# Patient Record
Sex: Male | Born: 1935
Health system: Southern US, Community
[De-identification: ages and names within clinical notes are randomized; demographics above are authoritative.]

## PROBLEM LIST (undated history)

## (undated) DIAGNOSIS — I4891 Unspecified atrial fibrillation: Secondary | ICD-10-CM

## (undated) DIAGNOSIS — N189 Chronic kidney disease, unspecified: Secondary | ICD-10-CM

## (undated) DIAGNOSIS — G629 Polyneuropathy, unspecified: Secondary | ICD-10-CM

## (undated) DIAGNOSIS — F0390 Unspecified dementia without behavioral disturbance: Secondary | ICD-10-CM

## (undated) DIAGNOSIS — G459 Transient cerebral ischemic attack, unspecified: Secondary | ICD-10-CM

## (undated) DIAGNOSIS — I10 Essential (primary) hypertension: Secondary | ICD-10-CM

## (undated) DIAGNOSIS — E079 Disorder of thyroid, unspecified: Secondary | ICD-10-CM

## (undated) HISTORY — DX: Polyneuropathy, unspecified: G62.9

## (undated) HISTORY — DX: Transient cerebral ischemic attack, unspecified: G45.9

## (undated) HISTORY — DX: Unspecified dementia, unspecified severity, without behavioral disturbance, psychotic disturbance, mood disturbance, and anxiety: F03.90

## (undated) HISTORY — PX: HIP SURGERY: SHX245

## (undated) HISTORY — DX: Essential (primary) hypertension: I10

## (undated) HISTORY — PX: KNEE SURGERY: SHX244

## (undated) HISTORY — PX: BACK SURGERY: SHX140

## (undated) HISTORY — PX: SHOULDER SURGERY: SHX246

## (undated) HISTORY — DX: Chronic kidney disease, unspecified: N18.9

## (undated) HISTORY — PX: CHOLECYSTECTOMY: SHX55

## (undated) HISTORY — DX: Disorder of thyroid, unspecified: E07.9

## (undated) HISTORY — DX: Unspecified atrial fibrillation: I48.91

---

## 2011-06-22 DIAGNOSIS — Z471 Aftercare following joint replacement surgery: Secondary | ICD-10-CM | POA: Diagnosis not present

## 2011-07-27 DIAGNOSIS — C44611 Basal cell carcinoma of skin of unspecified upper limb, including shoulder: Secondary | ICD-10-CM | POA: Diagnosis not present

## 2011-07-27 DIAGNOSIS — L570 Actinic keratosis: Secondary | ICD-10-CM | POA: Diagnosis not present

## 2011-07-29 DIAGNOSIS — M1990 Unspecified osteoarthritis, unspecified site: Secondary | ICD-10-CM | POA: Diagnosis not present

## 2011-07-29 DIAGNOSIS — R5381 Other malaise: Secondary | ICD-10-CM | POA: Diagnosis not present

## 2011-07-29 DIAGNOSIS — R5383 Other fatigue: Secondary | ICD-10-CM | POA: Diagnosis not present

## 2011-07-29 DIAGNOSIS — Z0000 Encounter for general adult medical examination without abnormal findings: Secondary | ICD-10-CM | POA: Diagnosis not present

## 2011-07-29 DIAGNOSIS — Z23 Encounter for immunization: Secondary | ICD-10-CM | POA: Diagnosis not present

## 2011-07-29 DIAGNOSIS — E559 Vitamin D deficiency, unspecified: Secondary | ICD-10-CM | POA: Diagnosis not present

## 2011-09-07 DIAGNOSIS — E039 Hypothyroidism, unspecified: Secondary | ICD-10-CM | POA: Diagnosis not present

## 2011-11-05 DIAGNOSIS — M069 Rheumatoid arthritis, unspecified: Secondary | ICD-10-CM | POA: Diagnosis not present

## 2011-11-12 DIAGNOSIS — L2089 Other atopic dermatitis: Secondary | ICD-10-CM | POA: Diagnosis not present

## 2011-11-26 DIAGNOSIS — C4441 Basal cell carcinoma of skin of scalp and neck: Secondary | ICD-10-CM | POA: Diagnosis not present

## 2011-12-20 DIAGNOSIS — Z471 Aftercare following joint replacement surgery: Secondary | ICD-10-CM | POA: Diagnosis not present

## 2011-12-31 DIAGNOSIS — L570 Actinic keratosis: Secondary | ICD-10-CM | POA: Diagnosis not present

## 2012-01-03 DIAGNOSIS — E039 Hypothyroidism, unspecified: Secondary | ICD-10-CM | POA: Diagnosis not present

## 2012-01-24 DIAGNOSIS — L570 Actinic keratosis: Secondary | ICD-10-CM | POA: Diagnosis not present

## 2012-02-24 DIAGNOSIS — M5137 Other intervertebral disc degeneration, lumbosacral region: Secondary | ICD-10-CM | POA: Diagnosis not present

## 2012-02-24 DIAGNOSIS — M545 Low back pain: Secondary | ICD-10-CM | POA: Diagnosis not present

## 2012-02-24 DIAGNOSIS — IMO0002 Reserved for concepts with insufficient information to code with codable children: Secondary | ICD-10-CM | POA: Diagnosis not present

## 2012-02-24 DIAGNOSIS — M999 Biomechanical lesion, unspecified: Secondary | ICD-10-CM | POA: Diagnosis not present

## 2012-02-28 DIAGNOSIS — M999 Biomechanical lesion, unspecified: Secondary | ICD-10-CM | POA: Diagnosis not present

## 2012-02-28 DIAGNOSIS — IMO0002 Reserved for concepts with insufficient information to code with codable children: Secondary | ICD-10-CM | POA: Diagnosis not present

## 2012-02-28 DIAGNOSIS — M5137 Other intervertebral disc degeneration, lumbosacral region: Secondary | ICD-10-CM | POA: Diagnosis not present

## 2012-02-28 DIAGNOSIS — M545 Low back pain: Secondary | ICD-10-CM | POA: Diagnosis not present

## 2012-03-01 DIAGNOSIS — IMO0002 Reserved for concepts with insufficient information to code with codable children: Secondary | ICD-10-CM | POA: Diagnosis not present

## 2012-03-01 DIAGNOSIS — M999 Biomechanical lesion, unspecified: Secondary | ICD-10-CM | POA: Diagnosis not present

## 2012-03-01 DIAGNOSIS — M5137 Other intervertebral disc degeneration, lumbosacral region: Secondary | ICD-10-CM | POA: Diagnosis not present

## 2012-03-01 DIAGNOSIS — M545 Low back pain: Secondary | ICD-10-CM | POA: Diagnosis not present

## 2012-03-03 DIAGNOSIS — M545 Low back pain: Secondary | ICD-10-CM | POA: Diagnosis not present

## 2012-03-03 DIAGNOSIS — M5137 Other intervertebral disc degeneration, lumbosacral region: Secondary | ICD-10-CM | POA: Diagnosis not present

## 2012-03-03 DIAGNOSIS — M999 Biomechanical lesion, unspecified: Secondary | ICD-10-CM | POA: Diagnosis not present

## 2012-03-03 DIAGNOSIS — IMO0002 Reserved for concepts with insufficient information to code with codable children: Secondary | ICD-10-CM | POA: Diagnosis not present

## 2012-03-06 DIAGNOSIS — IMO0002 Reserved for concepts with insufficient information to code with codable children: Secondary | ICD-10-CM | POA: Diagnosis not present

## 2012-03-06 DIAGNOSIS — M5137 Other intervertebral disc degeneration, lumbosacral region: Secondary | ICD-10-CM | POA: Diagnosis not present

## 2012-03-06 DIAGNOSIS — M545 Low back pain: Secondary | ICD-10-CM | POA: Diagnosis not present

## 2012-03-06 DIAGNOSIS — M999 Biomechanical lesion, unspecified: Secondary | ICD-10-CM | POA: Diagnosis not present

## 2012-03-08 DIAGNOSIS — M5137 Other intervertebral disc degeneration, lumbosacral region: Secondary | ICD-10-CM | POA: Diagnosis not present

## 2012-03-08 DIAGNOSIS — M545 Low back pain: Secondary | ICD-10-CM | POA: Diagnosis not present

## 2012-03-08 DIAGNOSIS — IMO0002 Reserved for concepts with insufficient information to code with codable children: Secondary | ICD-10-CM | POA: Diagnosis not present

## 2012-03-08 DIAGNOSIS — M999 Biomechanical lesion, unspecified: Secondary | ICD-10-CM | POA: Diagnosis not present

## 2012-03-10 DIAGNOSIS — IMO0002 Reserved for concepts with insufficient information to code with codable children: Secondary | ICD-10-CM | POA: Diagnosis not present

## 2012-03-10 DIAGNOSIS — M5137 Other intervertebral disc degeneration, lumbosacral region: Secondary | ICD-10-CM | POA: Diagnosis not present

## 2012-03-10 DIAGNOSIS — M545 Low back pain: Secondary | ICD-10-CM | POA: Diagnosis not present

## 2012-03-10 DIAGNOSIS — M999 Biomechanical lesion, unspecified: Secondary | ICD-10-CM | POA: Diagnosis not present

## 2012-03-13 DIAGNOSIS — IMO0002 Reserved for concepts with insufficient information to code with codable children: Secondary | ICD-10-CM | POA: Diagnosis not present

## 2012-03-13 DIAGNOSIS — M5137 Other intervertebral disc degeneration, lumbosacral region: Secondary | ICD-10-CM | POA: Diagnosis not present

## 2012-03-13 DIAGNOSIS — M545 Low back pain: Secondary | ICD-10-CM | POA: Diagnosis not present

## 2012-03-13 DIAGNOSIS — M999 Biomechanical lesion, unspecified: Secondary | ICD-10-CM | POA: Diagnosis not present

## 2012-03-15 DIAGNOSIS — M999 Biomechanical lesion, unspecified: Secondary | ICD-10-CM | POA: Diagnosis not present

## 2012-03-15 DIAGNOSIS — M5137 Other intervertebral disc degeneration, lumbosacral region: Secondary | ICD-10-CM | POA: Diagnosis not present

## 2012-03-15 DIAGNOSIS — IMO0002 Reserved for concepts with insufficient information to code with codable children: Secondary | ICD-10-CM | POA: Diagnosis not present

## 2012-03-15 DIAGNOSIS — M545 Low back pain: Secondary | ICD-10-CM | POA: Diagnosis not present

## 2012-03-17 DIAGNOSIS — M5137 Other intervertebral disc degeneration, lumbosacral region: Secondary | ICD-10-CM | POA: Diagnosis not present

## 2012-03-17 DIAGNOSIS — M545 Low back pain: Secondary | ICD-10-CM | POA: Diagnosis not present

## 2012-03-17 DIAGNOSIS — M999 Biomechanical lesion, unspecified: Secondary | ICD-10-CM | POA: Diagnosis not present

## 2012-03-17 DIAGNOSIS — IMO0002 Reserved for concepts with insufficient information to code with codable children: Secondary | ICD-10-CM | POA: Diagnosis not present

## 2012-03-20 DIAGNOSIS — M999 Biomechanical lesion, unspecified: Secondary | ICD-10-CM | POA: Diagnosis not present

## 2012-03-20 DIAGNOSIS — M5137 Other intervertebral disc degeneration, lumbosacral region: Secondary | ICD-10-CM | POA: Diagnosis not present

## 2012-03-20 DIAGNOSIS — IMO0002 Reserved for concepts with insufficient information to code with codable children: Secondary | ICD-10-CM | POA: Diagnosis not present

## 2012-03-20 DIAGNOSIS — M545 Low back pain: Secondary | ICD-10-CM | POA: Diagnosis not present

## 2012-03-22 DIAGNOSIS — M545 Low back pain: Secondary | ICD-10-CM | POA: Diagnosis not present

## 2012-03-22 DIAGNOSIS — M999 Biomechanical lesion, unspecified: Secondary | ICD-10-CM | POA: Diagnosis not present

## 2012-03-22 DIAGNOSIS — IMO0002 Reserved for concepts with insufficient information to code with codable children: Secondary | ICD-10-CM | POA: Diagnosis not present

## 2012-03-22 DIAGNOSIS — M5137 Other intervertebral disc degeneration, lumbosacral region: Secondary | ICD-10-CM | POA: Diagnosis not present

## 2012-03-24 DIAGNOSIS — M999 Biomechanical lesion, unspecified: Secondary | ICD-10-CM | POA: Diagnosis not present

## 2012-03-24 DIAGNOSIS — M5137 Other intervertebral disc degeneration, lumbosacral region: Secondary | ICD-10-CM | POA: Diagnosis not present

## 2012-03-24 DIAGNOSIS — IMO0002 Reserved for concepts with insufficient information to code with codable children: Secondary | ICD-10-CM | POA: Diagnosis not present

## 2012-03-24 DIAGNOSIS — M545 Low back pain: Secondary | ICD-10-CM | POA: Diagnosis not present

## 2012-03-27 DIAGNOSIS — IMO0002 Reserved for concepts with insufficient information to code with codable children: Secondary | ICD-10-CM | POA: Diagnosis not present

## 2012-03-27 DIAGNOSIS — M999 Biomechanical lesion, unspecified: Secondary | ICD-10-CM | POA: Diagnosis not present

## 2012-03-27 DIAGNOSIS — M545 Low back pain: Secondary | ICD-10-CM | POA: Diagnosis not present

## 2012-03-27 DIAGNOSIS — M5137 Other intervertebral disc degeneration, lumbosacral region: Secondary | ICD-10-CM | POA: Diagnosis not present

## 2012-03-29 DIAGNOSIS — M545 Low back pain: Secondary | ICD-10-CM | POA: Diagnosis not present

## 2012-03-29 DIAGNOSIS — IMO0002 Reserved for concepts with insufficient information to code with codable children: Secondary | ICD-10-CM | POA: Diagnosis not present

## 2012-03-29 DIAGNOSIS — M999 Biomechanical lesion, unspecified: Secondary | ICD-10-CM | POA: Diagnosis not present

## 2012-03-29 DIAGNOSIS — M5137 Other intervertebral disc degeneration, lumbosacral region: Secondary | ICD-10-CM | POA: Diagnosis not present

## 2012-03-31 DIAGNOSIS — IMO0002 Reserved for concepts with insufficient information to code with codable children: Secondary | ICD-10-CM | POA: Diagnosis not present

## 2012-03-31 DIAGNOSIS — M545 Low back pain: Secondary | ICD-10-CM | POA: Diagnosis not present

## 2012-03-31 DIAGNOSIS — M5137 Other intervertebral disc degeneration, lumbosacral region: Secondary | ICD-10-CM | POA: Diagnosis not present

## 2012-03-31 DIAGNOSIS — M999 Biomechanical lesion, unspecified: Secondary | ICD-10-CM | POA: Diagnosis not present

## 2012-04-03 DIAGNOSIS — IMO0002 Reserved for concepts with insufficient information to code with codable children: Secondary | ICD-10-CM | POA: Diagnosis not present

## 2012-04-03 DIAGNOSIS — M999 Biomechanical lesion, unspecified: Secondary | ICD-10-CM | POA: Diagnosis not present

## 2012-04-03 DIAGNOSIS — M545 Low back pain: Secondary | ICD-10-CM | POA: Diagnosis not present

## 2012-04-03 DIAGNOSIS — M5137 Other intervertebral disc degeneration, lumbosacral region: Secondary | ICD-10-CM | POA: Diagnosis not present

## 2012-04-05 DIAGNOSIS — M545 Low back pain: Secondary | ICD-10-CM | POA: Diagnosis not present

## 2012-04-05 DIAGNOSIS — M5137 Other intervertebral disc degeneration, lumbosacral region: Secondary | ICD-10-CM | POA: Diagnosis not present

## 2012-04-05 DIAGNOSIS — IMO0002 Reserved for concepts with insufficient information to code with codable children: Secondary | ICD-10-CM | POA: Diagnosis not present

## 2012-04-05 DIAGNOSIS — M999 Biomechanical lesion, unspecified: Secondary | ICD-10-CM | POA: Diagnosis not present

## 2012-04-07 DIAGNOSIS — M999 Biomechanical lesion, unspecified: Secondary | ICD-10-CM | POA: Diagnosis not present

## 2012-04-07 DIAGNOSIS — M5137 Other intervertebral disc degeneration, lumbosacral region: Secondary | ICD-10-CM | POA: Diagnosis not present

## 2012-04-07 DIAGNOSIS — M545 Low back pain: Secondary | ICD-10-CM | POA: Diagnosis not present

## 2012-04-07 DIAGNOSIS — IMO0002 Reserved for concepts with insufficient information to code with codable children: Secondary | ICD-10-CM | POA: Diagnosis not present

## 2012-04-10 DIAGNOSIS — M545 Low back pain: Secondary | ICD-10-CM | POA: Diagnosis not present

## 2012-04-10 DIAGNOSIS — M5137 Other intervertebral disc degeneration, lumbosacral region: Secondary | ICD-10-CM | POA: Diagnosis not present

## 2012-04-10 DIAGNOSIS — M999 Biomechanical lesion, unspecified: Secondary | ICD-10-CM | POA: Diagnosis not present

## 2012-04-10 DIAGNOSIS — IMO0002 Reserved for concepts with insufficient information to code with codable children: Secondary | ICD-10-CM | POA: Diagnosis not present

## 2012-04-12 DIAGNOSIS — M999 Biomechanical lesion, unspecified: Secondary | ICD-10-CM | POA: Diagnosis not present

## 2012-04-12 DIAGNOSIS — M545 Low back pain: Secondary | ICD-10-CM | POA: Diagnosis not present

## 2012-04-12 DIAGNOSIS — IMO0002 Reserved for concepts with insufficient information to code with codable children: Secondary | ICD-10-CM | POA: Diagnosis not present

## 2012-04-12 DIAGNOSIS — M5137 Other intervertebral disc degeneration, lumbosacral region: Secondary | ICD-10-CM | POA: Diagnosis not present

## 2012-04-14 DIAGNOSIS — M545 Low back pain: Secondary | ICD-10-CM | POA: Diagnosis not present

## 2012-04-14 DIAGNOSIS — M5137 Other intervertebral disc degeneration, lumbosacral region: Secondary | ICD-10-CM | POA: Diagnosis not present

## 2012-04-14 DIAGNOSIS — M999 Biomechanical lesion, unspecified: Secondary | ICD-10-CM | POA: Diagnosis not present

## 2012-04-14 DIAGNOSIS — IMO0002 Reserved for concepts with insufficient information to code with codable children: Secondary | ICD-10-CM | POA: Diagnosis not present

## 2012-04-18 DIAGNOSIS — Z23 Encounter for immunization: Secondary | ICD-10-CM | POA: Diagnosis not present

## 2012-04-20 DIAGNOSIS — IMO0002 Reserved for concepts with insufficient information to code with codable children: Secondary | ICD-10-CM | POA: Diagnosis not present

## 2012-04-20 DIAGNOSIS — M5137 Other intervertebral disc degeneration, lumbosacral region: Secondary | ICD-10-CM | POA: Diagnosis not present

## 2012-04-20 DIAGNOSIS — M999 Biomechanical lesion, unspecified: Secondary | ICD-10-CM | POA: Diagnosis not present

## 2012-04-20 DIAGNOSIS — M545 Low back pain: Secondary | ICD-10-CM | POA: Diagnosis not present

## 2012-04-27 DIAGNOSIS — M5137 Other intervertebral disc degeneration, lumbosacral region: Secondary | ICD-10-CM | POA: Diagnosis not present

## 2012-04-27 DIAGNOSIS — M545 Low back pain: Secondary | ICD-10-CM | POA: Diagnosis not present

## 2012-04-27 DIAGNOSIS — M999 Biomechanical lesion, unspecified: Secondary | ICD-10-CM | POA: Diagnosis not present

## 2012-04-27 DIAGNOSIS — IMO0002 Reserved for concepts with insufficient information to code with codable children: Secondary | ICD-10-CM | POA: Diagnosis not present

## 2012-05-01 DIAGNOSIS — M5137 Other intervertebral disc degeneration, lumbosacral region: Secondary | ICD-10-CM | POA: Diagnosis not present

## 2012-05-01 DIAGNOSIS — M999 Biomechanical lesion, unspecified: Secondary | ICD-10-CM | POA: Diagnosis not present

## 2012-05-01 DIAGNOSIS — M545 Low back pain: Secondary | ICD-10-CM | POA: Diagnosis not present

## 2012-05-01 DIAGNOSIS — IMO0002 Reserved for concepts with insufficient information to code with codable children: Secondary | ICD-10-CM | POA: Diagnosis not present

## 2012-05-11 DIAGNOSIS — M999 Biomechanical lesion, unspecified: Secondary | ICD-10-CM | POA: Diagnosis not present

## 2012-05-11 DIAGNOSIS — IMO0002 Reserved for concepts with insufficient information to code with codable children: Secondary | ICD-10-CM | POA: Diagnosis not present

## 2012-05-11 DIAGNOSIS — M5137 Other intervertebral disc degeneration, lumbosacral region: Secondary | ICD-10-CM | POA: Diagnosis not present

## 2012-05-11 DIAGNOSIS — M545 Low back pain: Secondary | ICD-10-CM | POA: Diagnosis not present

## 2012-05-25 DIAGNOSIS — M4800 Spinal stenosis, site unspecified: Secondary | ICD-10-CM | POA: Diagnosis not present

## 2012-05-25 DIAGNOSIS — I10 Essential (primary) hypertension: Secondary | ICD-10-CM | POA: Diagnosis not present

## 2012-05-25 DIAGNOSIS — M549 Dorsalgia, unspecified: Secondary | ICD-10-CM | POA: Diagnosis not present

## 2012-06-26 DIAGNOSIS — G894 Chronic pain syndrome: Secondary | ICD-10-CM | POA: Diagnosis not present

## 2012-07-27 DIAGNOSIS — L570 Actinic keratosis: Secondary | ICD-10-CM | POA: Diagnosis not present

## 2012-07-28 DIAGNOSIS — E559 Vitamin D deficiency, unspecified: Secondary | ICD-10-CM | POA: Diagnosis not present

## 2012-07-28 DIAGNOSIS — M8448XA Pathological fracture, other site, initial encounter for fracture: Secondary | ICD-10-CM | POA: Diagnosis not present

## 2012-07-28 DIAGNOSIS — M4800 Spinal stenosis, site unspecified: Secondary | ICD-10-CM | POA: Diagnosis not present

## 2012-07-28 DIAGNOSIS — I10 Essential (primary) hypertension: Secondary | ICD-10-CM | POA: Diagnosis not present

## 2012-07-28 DIAGNOSIS — E039 Hypothyroidism, unspecified: Secondary | ICD-10-CM | POA: Diagnosis not present

## 2012-10-25 DIAGNOSIS — I10 Essential (primary) hypertension: Secondary | ICD-10-CM | POA: Diagnosis not present

## 2012-10-25 DIAGNOSIS — G894 Chronic pain syndrome: Secondary | ICD-10-CM | POA: Diagnosis not present

## 2013-01-25 DIAGNOSIS — C44611 Basal cell carcinoma of skin of unspecified upper limb, including shoulder: Secondary | ICD-10-CM | POA: Diagnosis not present

## 2013-01-25 DIAGNOSIS — L570 Actinic keratosis: Secondary | ICD-10-CM | POA: Diagnosis not present

## 2013-01-26 DIAGNOSIS — G894 Chronic pain syndrome: Secondary | ICD-10-CM | POA: Diagnosis not present

## 2013-01-26 DIAGNOSIS — M069 Rheumatoid arthritis, unspecified: Secondary | ICD-10-CM | POA: Diagnosis not present

## 2013-02-20 DIAGNOSIS — C44621 Squamous cell carcinoma of skin of unspecified upper limb, including shoulder: Secondary | ICD-10-CM | POA: Diagnosis not present

## 2013-03-08 DIAGNOSIS — Z23 Encounter for immunization: Secondary | ICD-10-CM | POA: Diagnosis not present

## 2013-04-26 DIAGNOSIS — G894 Chronic pain syndrome: Secondary | ICD-10-CM | POA: Diagnosis not present

## 2013-05-01 DIAGNOSIS — L570 Actinic keratosis: Secondary | ICD-10-CM | POA: Diagnosis not present

## 2013-06-06 DIAGNOSIS — M549 Dorsalgia, unspecified: Secondary | ICD-10-CM | POA: Diagnosis not present

## 2013-06-19 DIAGNOSIS — Z471 Aftercare following joint replacement surgery: Secondary | ICD-10-CM | POA: Diagnosis not present

## 2013-06-19 DIAGNOSIS — Z96649 Presence of unspecified artificial hip joint: Secondary | ICD-10-CM | POA: Diagnosis not present

## 2013-06-19 DIAGNOSIS — IMO0002 Reserved for concepts with insufficient information to code with codable children: Secondary | ICD-10-CM | POA: Diagnosis not present

## 2013-08-03 DIAGNOSIS — L570 Actinic keratosis: Secondary | ICD-10-CM | POA: Diagnosis not present

## 2013-08-08 DIAGNOSIS — I10 Essential (primary) hypertension: Secondary | ICD-10-CM | POA: Diagnosis not present

## 2013-08-08 DIAGNOSIS — Z79899 Other long term (current) drug therapy: Secondary | ICD-10-CM | POA: Diagnosis not present

## 2013-08-08 DIAGNOSIS — E559 Vitamin D deficiency, unspecified: Secondary | ICD-10-CM | POA: Diagnosis not present

## 2013-08-08 DIAGNOSIS — M79609 Pain in unspecified limb: Secondary | ICD-10-CM | POA: Diagnosis not present

## 2013-08-08 DIAGNOSIS — M069 Rheumatoid arthritis, unspecified: Secondary | ICD-10-CM | POA: Diagnosis not present

## 2013-08-08 DIAGNOSIS — E0590 Thyrotoxicosis, unspecified without thyrotoxic crisis or storm: Secondary | ICD-10-CM | POA: Diagnosis not present

## 2013-09-18 DIAGNOSIS — I10 Essential (primary) hypertension: Secondary | ICD-10-CM | POA: Diagnosis not present

## 2013-09-18 DIAGNOSIS — M109 Gout, unspecified: Secondary | ICD-10-CM | POA: Diagnosis not present

## 2013-11-14 DIAGNOSIS — G894 Chronic pain syndrome: Secondary | ICD-10-CM | POA: Diagnosis not present

## 2013-11-14 DIAGNOSIS — M069 Rheumatoid arthritis, unspecified: Secondary | ICD-10-CM | POA: Diagnosis not present

## 2014-01-29 DIAGNOSIS — L570 Actinic keratosis: Secondary | ICD-10-CM | POA: Diagnosis not present

## 2014-01-29 DIAGNOSIS — C44319 Basal cell carcinoma of skin of other parts of face: Secondary | ICD-10-CM | POA: Diagnosis not present

## 2014-02-14 DIAGNOSIS — G894 Chronic pain syndrome: Secondary | ICD-10-CM | POA: Diagnosis not present

## 2014-02-14 DIAGNOSIS — Z23 Encounter for immunization: Secondary | ICD-10-CM | POA: Diagnosis not present

## 2014-05-07 DIAGNOSIS — L578 Other skin changes due to chronic exposure to nonionizing radiation: Secondary | ICD-10-CM | POA: Diagnosis not present

## 2014-05-07 DIAGNOSIS — L570 Actinic keratosis: Secondary | ICD-10-CM | POA: Diagnosis not present

## 2014-05-07 DIAGNOSIS — L821 Other seborrheic keratosis: Secondary | ICD-10-CM | POA: Diagnosis not present

## 2014-05-16 DIAGNOSIS — G894 Chronic pain syndrome: Secondary | ICD-10-CM | POA: Diagnosis not present

## 2014-08-06 DIAGNOSIS — L570 Actinic keratosis: Secondary | ICD-10-CM | POA: Diagnosis not present

## 2014-08-06 DIAGNOSIS — L578 Other skin changes due to chronic exposure to nonionizing radiation: Secondary | ICD-10-CM | POA: Diagnosis not present

## 2014-08-06 DIAGNOSIS — L821 Other seborrheic keratosis: Secondary | ICD-10-CM | POA: Diagnosis not present

## 2014-08-14 DIAGNOSIS — M109 Gout, unspecified: Secondary | ICD-10-CM | POA: Diagnosis not present

## 2014-08-14 DIAGNOSIS — Z79899 Other long term (current) drug therapy: Secondary | ICD-10-CM | POA: Diagnosis not present

## 2014-08-14 DIAGNOSIS — N183 Chronic kidney disease, stage 3 (moderate): Secondary | ICD-10-CM | POA: Diagnosis not present

## 2014-08-14 DIAGNOSIS — M069 Rheumatoid arthritis, unspecified: Secondary | ICD-10-CM | POA: Diagnosis not present

## 2014-08-14 DIAGNOSIS — Z1389 Encounter for screening for other disorder: Secondary | ICD-10-CM | POA: Diagnosis not present

## 2014-08-14 DIAGNOSIS — I10 Essential (primary) hypertension: Secondary | ICD-10-CM | POA: Diagnosis not present

## 2014-08-14 DIAGNOSIS — E039 Hypothyroidism, unspecified: Secondary | ICD-10-CM | POA: Diagnosis not present

## 2014-11-15 DIAGNOSIS — Z79899 Other long term (current) drug therapy: Secondary | ICD-10-CM | POA: Diagnosis not present

## 2014-11-15 DIAGNOSIS — M069 Rheumatoid arthritis, unspecified: Secondary | ICD-10-CM | POA: Diagnosis not present

## 2014-11-15 DIAGNOSIS — G894 Chronic pain syndrome: Secondary | ICD-10-CM | POA: Diagnosis not present

## 2015-01-08 DIAGNOSIS — M109 Gout, unspecified: Secondary | ICD-10-CM | POA: Diagnosis not present

## 2015-01-08 DIAGNOSIS — R42 Dizziness and giddiness: Secondary | ICD-10-CM | POA: Diagnosis not present

## 2015-01-08 DIAGNOSIS — M069 Rheumatoid arthritis, unspecified: Secondary | ICD-10-CM | POA: Diagnosis not present

## 2015-01-08 DIAGNOSIS — I10 Essential (primary) hypertension: Secondary | ICD-10-CM | POA: Diagnosis not present

## 2015-01-08 DIAGNOSIS — R531 Weakness: Secondary | ICD-10-CM | POA: Diagnosis not present

## 2015-01-30 DIAGNOSIS — M47817 Spondylosis without myelopathy or radiculopathy, lumbosacral region: Secondary | ICD-10-CM | POA: Diagnosis not present

## 2015-01-30 DIAGNOSIS — M419 Scoliosis, unspecified: Secondary | ICD-10-CM | POA: Diagnosis not present

## 2015-01-30 DIAGNOSIS — M545 Low back pain: Secondary | ICD-10-CM | POA: Diagnosis not present

## 2015-01-30 DIAGNOSIS — M5125 Other intervertebral disc displacement, thoracolumbar region: Secondary | ICD-10-CM | POA: Diagnosis not present

## 2015-01-30 DIAGNOSIS — M4806 Spinal stenosis, lumbar region: Secondary | ICD-10-CM | POA: Diagnosis not present

## 2015-02-12 DIAGNOSIS — M06071 Rheumatoid arthritis without rheumatoid factor, right ankle and foot: Secondary | ICD-10-CM | POA: Diagnosis not present

## 2015-02-12 DIAGNOSIS — Z79899 Other long term (current) drug therapy: Secondary | ICD-10-CM | POA: Diagnosis not present

## 2015-02-12 DIAGNOSIS — G894 Chronic pain syndrome: Secondary | ICD-10-CM | POA: Diagnosis not present

## 2015-02-25 DIAGNOSIS — Z23 Encounter for immunization: Secondary | ICD-10-CM | POA: Diagnosis not present

## 2015-03-06 DIAGNOSIS — M47817 Spondylosis without myelopathy or radiculopathy, lumbosacral region: Secondary | ICD-10-CM | POA: Diagnosis not present

## 2015-05-07 DIAGNOSIS — L821 Other seborrheic keratosis: Secondary | ICD-10-CM | POA: Diagnosis not present

## 2015-05-07 DIAGNOSIS — L570 Actinic keratosis: Secondary | ICD-10-CM | POA: Diagnosis not present

## 2015-05-07 DIAGNOSIS — L578 Other skin changes due to chronic exposure to nonionizing radiation: Secondary | ICD-10-CM | POA: Diagnosis not present

## 2015-05-07 DIAGNOSIS — C44219 Basal cell carcinoma of skin of left ear and external auricular canal: Secondary | ICD-10-CM | POA: Diagnosis not present

## 2015-05-13 DIAGNOSIS — M05772 Rheumatoid arthritis with rheumatoid factor of left ankle and foot without organ or systems involvement: Secondary | ICD-10-CM | POA: Diagnosis not present

## 2015-05-13 DIAGNOSIS — I10 Essential (primary) hypertension: Secondary | ICD-10-CM | POA: Diagnosis not present

## 2015-05-13 DIAGNOSIS — M05771 Rheumatoid arthritis with rheumatoid factor of right ankle and foot without organ or systems involvement: Secondary | ICD-10-CM | POA: Diagnosis not present

## 2015-05-27 DIAGNOSIS — L570 Actinic keratosis: Secondary | ICD-10-CM | POA: Diagnosis not present

## 2015-05-27 DIAGNOSIS — L578 Other skin changes due to chronic exposure to nonionizing radiation: Secondary | ICD-10-CM | POA: Diagnosis not present

## 2015-05-27 DIAGNOSIS — C44219 Basal cell carcinoma of skin of left ear and external auricular canal: Secondary | ICD-10-CM | POA: Diagnosis not present

## 2015-06-10 DIAGNOSIS — H2513 Age-related nuclear cataract, bilateral: Secondary | ICD-10-CM | POA: Diagnosis not present

## 2015-06-10 DIAGNOSIS — H59333 Postprocedural hematoma of eye and adnexa following an ophthalmic procedure, bilateral: Secondary | ICD-10-CM | POA: Diagnosis not present

## 2015-06-10 DIAGNOSIS — H53469 Homonymous bilateral field defects, unspecified side: Secondary | ICD-10-CM | POA: Diagnosis not present

## 2015-06-19 DIAGNOSIS — R1031 Right lower quadrant pain: Secondary | ICD-10-CM | POA: Diagnosis not present

## 2015-06-19 DIAGNOSIS — M25551 Pain in right hip: Secondary | ICD-10-CM | POA: Diagnosis not present

## 2015-06-19 DIAGNOSIS — R937 Abnormal findings on diagnostic imaging of other parts of musculoskeletal system: Secondary | ICD-10-CM | POA: Diagnosis not present

## 2015-06-25 DIAGNOSIS — R972 Elevated prostate specific antigen [PSA]: Secondary | ICD-10-CM | POA: Diagnosis not present

## 2015-06-25 DIAGNOSIS — M25551 Pain in right hip: Secondary | ICD-10-CM | POA: Diagnosis not present

## 2015-06-27 DIAGNOSIS — M25551 Pain in right hip: Secondary | ICD-10-CM | POA: Diagnosis not present

## 2015-06-27 DIAGNOSIS — R52 Pain, unspecified: Secondary | ICD-10-CM | POA: Diagnosis not present

## 2015-07-04 DIAGNOSIS — M25551 Pain in right hip: Secondary | ICD-10-CM | POA: Diagnosis not present

## 2015-07-07 DIAGNOSIS — M25551 Pain in right hip: Secondary | ICD-10-CM | POA: Diagnosis not present

## 2015-07-07 DIAGNOSIS — Z96643 Presence of artificial hip joint, bilateral: Secondary | ICD-10-CM | POA: Diagnosis not present

## 2015-07-07 DIAGNOSIS — Z96641 Presence of right artificial hip joint: Secondary | ICD-10-CM | POA: Diagnosis not present

## 2015-07-10 DIAGNOSIS — M25551 Pain in right hip: Secondary | ICD-10-CM | POA: Diagnosis not present

## 2015-07-25 DIAGNOSIS — E039 Hypothyroidism, unspecified: Secondary | ICD-10-CM | POA: Diagnosis not present

## 2015-07-25 DIAGNOSIS — N189 Chronic kidney disease, unspecified: Secondary | ICD-10-CM | POA: Diagnosis not present

## 2015-07-25 DIAGNOSIS — R079 Chest pain, unspecified: Secondary | ICD-10-CM | POA: Diagnosis not present

## 2015-07-25 DIAGNOSIS — R0602 Shortness of breath: Secondary | ICD-10-CM | POA: Diagnosis not present

## 2015-07-25 DIAGNOSIS — Z87891 Personal history of nicotine dependence: Secondary | ICD-10-CM | POA: Diagnosis not present

## 2015-07-25 DIAGNOSIS — Z79899 Other long term (current) drug therapy: Secondary | ICD-10-CM | POA: Diagnosis not present

## 2015-07-25 DIAGNOSIS — R1084 Generalized abdominal pain: Secondary | ICD-10-CM | POA: Diagnosis not present

## 2015-07-25 DIAGNOSIS — I129 Hypertensive chronic kidney disease with stage 1 through stage 4 chronic kidney disease, or unspecified chronic kidney disease: Secondary | ICD-10-CM | POA: Diagnosis not present

## 2015-07-25 DIAGNOSIS — R109 Unspecified abdominal pain: Secondary | ICD-10-CM | POA: Diagnosis not present

## 2015-07-26 DIAGNOSIS — R079 Chest pain, unspecified: Secondary | ICD-10-CM | POA: Diagnosis not present

## 2015-08-07 DIAGNOSIS — I10 Essential (primary) hypertension: Secondary | ICD-10-CM | POA: Diagnosis not present

## 2015-08-18 DIAGNOSIS — M150 Primary generalized (osteo)arthritis: Secondary | ICD-10-CM | POA: Diagnosis not present

## 2015-08-18 DIAGNOSIS — Z1389 Encounter for screening for other disorder: Secondary | ICD-10-CM | POA: Diagnosis not present

## 2015-08-18 DIAGNOSIS — E559 Vitamin D deficiency, unspecified: Secondary | ICD-10-CM | POA: Diagnosis not present

## 2015-08-18 DIAGNOSIS — M4850XD Collapsed vertebra, not elsewhere classified, site unspecified, subsequent encounter for fracture with routine healing: Secondary | ICD-10-CM | POA: Diagnosis not present

## 2015-08-18 DIAGNOSIS — E039 Hypothyroidism, unspecified: Secondary | ICD-10-CM | POA: Diagnosis not present

## 2015-08-18 DIAGNOSIS — M069 Rheumatoid arthritis, unspecified: Secondary | ICD-10-CM | POA: Diagnosis not present

## 2015-08-18 DIAGNOSIS — Z23 Encounter for immunization: Secondary | ICD-10-CM | POA: Diagnosis not present

## 2015-08-18 DIAGNOSIS — N183 Chronic kidney disease, stage 3 (moderate): Secondary | ICD-10-CM | POA: Diagnosis not present

## 2015-08-18 DIAGNOSIS — M1A0790 Idiopathic chronic gout, unspecified ankle and foot, without tophus (tophi): Secondary | ICD-10-CM | POA: Diagnosis not present

## 2015-08-18 DIAGNOSIS — I10 Essential (primary) hypertension: Secondary | ICD-10-CM | POA: Diagnosis not present

## 2015-09-02 DIAGNOSIS — L570 Actinic keratosis: Secondary | ICD-10-CM | POA: Diagnosis not present

## 2015-09-02 DIAGNOSIS — C44219 Basal cell carcinoma of skin of left ear and external auricular canal: Secondary | ICD-10-CM | POA: Diagnosis not present

## 2015-09-08 DIAGNOSIS — L720 Epidermal cyst: Secondary | ICD-10-CM | POA: Diagnosis not present

## 2015-09-08 DIAGNOSIS — L570 Actinic keratosis: Secondary | ICD-10-CM | POA: Diagnosis not present

## 2015-11-12 DIAGNOSIS — M069 Rheumatoid arthritis, unspecified: Secondary | ICD-10-CM | POA: Diagnosis not present

## 2015-11-12 DIAGNOSIS — I10 Essential (primary) hypertension: Secondary | ICD-10-CM | POA: Diagnosis not present

## 2015-12-31 DIAGNOSIS — L578 Other skin changes due to chronic exposure to nonionizing radiation: Secondary | ICD-10-CM | POA: Diagnosis not present

## 2015-12-31 DIAGNOSIS — B079 Viral wart, unspecified: Secondary | ICD-10-CM | POA: Diagnosis not present

## 2015-12-31 DIAGNOSIS — L570 Actinic keratosis: Secondary | ICD-10-CM | POA: Diagnosis not present

## 2016-02-12 DIAGNOSIS — R5383 Other fatigue: Secondary | ICD-10-CM | POA: Diagnosis not present

## 2016-02-12 DIAGNOSIS — E039 Hypothyroidism, unspecified: Secondary | ICD-10-CM | POA: Diagnosis not present

## 2016-02-12 DIAGNOSIS — D510 Vitamin B12 deficiency anemia due to intrinsic factor deficiency: Secondary | ICD-10-CM | POA: Diagnosis not present

## 2016-02-12 DIAGNOSIS — I10 Essential (primary) hypertension: Secondary | ICD-10-CM | POA: Diagnosis not present

## 2016-02-12 DIAGNOSIS — Z23 Encounter for immunization: Secondary | ICD-10-CM | POA: Diagnosis not present

## 2016-04-12 DIAGNOSIS — M069 Rheumatoid arthritis, unspecified: Secondary | ICD-10-CM | POA: Diagnosis not present

## 2016-04-17 DIAGNOSIS — I119 Hypertensive heart disease without heart failure: Secondary | ICD-10-CM | POA: Diagnosis not present

## 2016-04-17 DIAGNOSIS — M1990 Unspecified osteoarthritis, unspecified site: Secondary | ICD-10-CM | POA: Diagnosis not present

## 2016-04-17 DIAGNOSIS — M109 Gout, unspecified: Secondary | ICD-10-CM | POA: Diagnosis present

## 2016-04-17 DIAGNOSIS — E039 Hypothyroidism, unspecified: Secondary | ICD-10-CM

## 2016-04-17 DIAGNOSIS — K219 Gastro-esophageal reflux disease without esophagitis: Secondary | ICD-10-CM | POA: Diagnosis not present

## 2016-04-17 DIAGNOSIS — I129 Hypertensive chronic kidney disease with stage 1 through stage 4 chronic kidney disease, or unspecified chronic kidney disease: Secondary | ICD-10-CM | POA: Diagnosis not present

## 2016-04-17 DIAGNOSIS — I4891 Unspecified atrial fibrillation: Secondary | ICD-10-CM | POA: Diagnosis not present

## 2016-04-17 DIAGNOSIS — R778 Other specified abnormalities of plasma proteins: Secondary | ICD-10-CM | POA: Diagnosis not present

## 2016-04-17 DIAGNOSIS — H6123 Impacted cerumen, bilateral: Secondary | ICD-10-CM | POA: Diagnosis not present

## 2016-04-17 DIAGNOSIS — I1310 Hypertensive heart and chronic kidney disease without heart failure, with stage 1 through stage 4 chronic kidney disease, or unspecified chronic kidney disease: Secondary | ICD-10-CM | POA: Diagnosis not present

## 2016-04-17 DIAGNOSIS — Z7901 Long term (current) use of anticoagulants: Secondary | ICD-10-CM | POA: Diagnosis not present

## 2016-04-17 DIAGNOSIS — I10 Essential (primary) hypertension: Secondary | ICD-10-CM

## 2016-04-17 DIAGNOSIS — R42 Dizziness and giddiness: Secondary | ICD-10-CM | POA: Diagnosis not present

## 2016-04-17 DIAGNOSIS — R531 Weakness: Secondary | ICD-10-CM | POA: Diagnosis not present

## 2016-04-17 DIAGNOSIS — N189 Chronic kidney disease, unspecified: Secondary | ICD-10-CM | POA: Diagnosis not present

## 2016-04-17 DIAGNOSIS — Z79899 Other long term (current) drug therapy: Secondary | ICD-10-CM | POA: Diagnosis not present

## 2016-04-17 DIAGNOSIS — R0602 Shortness of breath: Secondary | ICD-10-CM | POA: Diagnosis not present

## 2016-04-17 DIAGNOSIS — R079 Chest pain, unspecified: Secondary | ICD-10-CM | POA: Diagnosis not present

## 2016-04-17 DIAGNOSIS — Z880 Allergy status to penicillin: Secondary | ICD-10-CM | POA: Diagnosis not present

## 2016-04-17 DIAGNOSIS — I200 Unstable angina: Secondary | ICD-10-CM | POA: Diagnosis not present

## 2016-04-17 DIAGNOSIS — N183 Chronic kidney disease, stage 3 (moderate): Secondary | ICD-10-CM | POA: Diagnosis not present

## 2016-04-17 DIAGNOSIS — Z8249 Family history of ischemic heart disease and other diseases of the circulatory system: Secondary | ICD-10-CM | POA: Diagnosis not present

## 2016-04-18 DIAGNOSIS — R079 Chest pain, unspecified: Secondary | ICD-10-CM | POA: Diagnosis not present

## 2016-04-26 DIAGNOSIS — E039 Hypothyroidism, unspecified: Secondary | ICD-10-CM | POA: Diagnosis not present

## 2016-04-26 DIAGNOSIS — I482 Chronic atrial fibrillation: Secondary | ICD-10-CM | POA: Diagnosis not present

## 2016-04-26 DIAGNOSIS — I119 Hypertensive heart disease without heart failure: Secondary | ICD-10-CM | POA: Diagnosis not present

## 2016-04-26 DIAGNOSIS — M1A00X0 Idiopathic chronic gout, unspecified site, without tophus (tophi): Secondary | ICD-10-CM | POA: Diagnosis not present

## 2016-04-26 DIAGNOSIS — G609 Hereditary and idiopathic neuropathy, unspecified: Secondary | ICD-10-CM | POA: Diagnosis not present

## 2016-04-26 DIAGNOSIS — Z79899 Other long term (current) drug therapy: Secondary | ICD-10-CM | POA: Diagnosis not present

## 2016-05-19 DIAGNOSIS — I480 Paroxysmal atrial fibrillation: Secondary | ICD-10-CM | POA: Diagnosis not present

## 2016-05-19 DIAGNOSIS — I1310 Hypertensive heart and chronic kidney disease without heart failure, with stage 1 through stage 4 chronic kidney disease, or unspecified chronic kidney disease: Secondary | ICD-10-CM | POA: Insufficient documentation

## 2016-05-19 DIAGNOSIS — I10 Essential (primary) hypertension: Secondary | ICD-10-CM | POA: Insufficient documentation

## 2016-05-19 DIAGNOSIS — E039 Hypothyroidism, unspecified: Secondary | ICD-10-CM | POA: Insufficient documentation

## 2016-05-28 DIAGNOSIS — I119 Hypertensive heart disease without heart failure: Secondary | ICD-10-CM | POA: Diagnosis not present

## 2016-05-28 DIAGNOSIS — G609 Hereditary and idiopathic neuropathy, unspecified: Secondary | ICD-10-CM | POA: Diagnosis not present

## 2016-05-28 DIAGNOSIS — M1A00X0 Idiopathic chronic gout, unspecified site, without tophus (tophi): Secondary | ICD-10-CM | POA: Diagnosis not present

## 2016-05-28 DIAGNOSIS — E039 Hypothyroidism, unspecified: Secondary | ICD-10-CM | POA: Diagnosis not present

## 2016-05-28 DIAGNOSIS — Z0000 Encounter for general adult medical examination without abnormal findings: Secondary | ICD-10-CM | POA: Diagnosis not present

## 2016-07-02 DIAGNOSIS — I10 Essential (primary) hypertension: Secondary | ICD-10-CM | POA: Diagnosis not present

## 2016-07-02 DIAGNOSIS — Z5181 Encounter for therapeutic drug level monitoring: Secondary | ICD-10-CM | POA: Diagnosis not present

## 2016-07-02 DIAGNOSIS — G8929 Other chronic pain: Secondary | ICD-10-CM | POA: Diagnosis not present

## 2016-07-02 DIAGNOSIS — Z79899 Other long term (current) drug therapy: Secondary | ICD-10-CM | POA: Diagnosis not present

## 2016-07-02 DIAGNOSIS — I4891 Unspecified atrial fibrillation: Secondary | ICD-10-CM | POA: Diagnosis not present

## 2016-07-08 DIAGNOSIS — I4891 Unspecified atrial fibrillation: Secondary | ICD-10-CM | POA: Diagnosis not present

## 2016-07-08 DIAGNOSIS — R5383 Other fatigue: Secondary | ICD-10-CM | POA: Diagnosis not present

## 2016-07-08 DIAGNOSIS — R531 Weakness: Secondary | ICD-10-CM | POA: Diagnosis not present

## 2016-07-20 DIAGNOSIS — E039 Hypothyroidism, unspecified: Secondary | ICD-10-CM | POA: Diagnosis not present

## 2016-07-20 DIAGNOSIS — I480 Paroxysmal atrial fibrillation: Secondary | ICD-10-CM | POA: Diagnosis not present

## 2016-07-20 DIAGNOSIS — Z6826 Body mass index (BMI) 26.0-26.9, adult: Secondary | ICD-10-CM | POA: Diagnosis not present

## 2016-07-20 DIAGNOSIS — I10 Essential (primary) hypertension: Secondary | ICD-10-CM | POA: Diagnosis not present

## 2016-08-04 DIAGNOSIS — I480 Paroxysmal atrial fibrillation: Secondary | ICD-10-CM | POA: Diagnosis not present

## 2016-08-13 DIAGNOSIS — I480 Paroxysmal atrial fibrillation: Secondary | ICD-10-CM | POA: Diagnosis not present

## 2016-08-13 DIAGNOSIS — I10 Essential (primary) hypertension: Secondary | ICD-10-CM | POA: Diagnosis not present

## 2016-08-13 DIAGNOSIS — E039 Hypothyroidism, unspecified: Secondary | ICD-10-CM | POA: Diagnosis not present

## 2016-09-09 DIAGNOSIS — F331 Major depressive disorder, recurrent, moderate: Secondary | ICD-10-CM | POA: Diagnosis not present

## 2016-09-09 DIAGNOSIS — R413 Other amnesia: Secondary | ICD-10-CM | POA: Diagnosis not present

## 2016-09-09 DIAGNOSIS — Z1389 Encounter for screening for other disorder: Secondary | ICD-10-CM | POA: Diagnosis not present

## 2016-09-13 DIAGNOSIS — E539 Vitamin B deficiency, unspecified: Secondary | ICD-10-CM | POA: Diagnosis not present

## 2016-09-14 DIAGNOSIS — R413 Other amnesia: Secondary | ICD-10-CM | POA: Diagnosis not present

## 2016-09-14 DIAGNOSIS — E539 Vitamin B deficiency, unspecified: Secondary | ICD-10-CM | POA: Diagnosis not present

## 2016-09-16 DIAGNOSIS — E539 Vitamin B deficiency, unspecified: Secondary | ICD-10-CM | POA: Diagnosis not present

## 2016-09-17 DIAGNOSIS — E539 Vitamin B deficiency, unspecified: Secondary | ICD-10-CM | POA: Diagnosis not present

## 2016-09-30 DIAGNOSIS — Z79899 Other long term (current) drug therapy: Secondary | ICD-10-CM | POA: Diagnosis not present

## 2016-09-30 DIAGNOSIS — M069 Rheumatoid arthritis, unspecified: Secondary | ICD-10-CM | POA: Diagnosis not present

## 2016-09-30 DIAGNOSIS — I10 Essential (primary) hypertension: Secondary | ICD-10-CM | POA: Diagnosis not present

## 2016-09-30 DIAGNOSIS — Z5181 Encounter for therapeutic drug level monitoring: Secondary | ICD-10-CM | POA: Diagnosis not present

## 2016-09-30 DIAGNOSIS — R413 Other amnesia: Secondary | ICD-10-CM | POA: Diagnosis not present

## 2016-09-30 DIAGNOSIS — E538 Deficiency of other specified B group vitamins: Secondary | ICD-10-CM | POA: Diagnosis not present

## 2016-09-30 DIAGNOSIS — Z6823 Body mass index (BMI) 23.0-23.9, adult: Secondary | ICD-10-CM | POA: Diagnosis not present

## 2016-09-30 DIAGNOSIS — N183 Chronic kidney disease, stage 3 (moderate): Secondary | ICD-10-CM | POA: Diagnosis not present

## 2016-09-30 DIAGNOSIS — M150 Primary generalized (osteo)arthritis: Secondary | ICD-10-CM | POA: Diagnosis not present

## 2016-09-30 DIAGNOSIS — E039 Hypothyroidism, unspecified: Secondary | ICD-10-CM | POA: Diagnosis not present

## 2016-09-30 DIAGNOSIS — I4891 Unspecified atrial fibrillation: Secondary | ICD-10-CM | POA: Diagnosis not present

## 2016-09-30 DIAGNOSIS — G894 Chronic pain syndrome: Secondary | ICD-10-CM | POA: Diagnosis not present

## 2016-10-15 DIAGNOSIS — R2681 Unsteadiness on feet: Secondary | ICD-10-CM | POA: Diagnosis not present

## 2016-10-15 DIAGNOSIS — R531 Weakness: Secondary | ICD-10-CM | POA: Diagnosis not present

## 2016-10-21 DIAGNOSIS — M5416 Radiculopathy, lumbar region: Secondary | ICD-10-CM | POA: Diagnosis not present

## 2016-10-21 DIAGNOSIS — I129 Hypertensive chronic kidney disease with stage 1 through stage 4 chronic kidney disease, or unspecified chronic kidney disease: Secondary | ICD-10-CM | POA: Diagnosis not present

## 2016-10-21 DIAGNOSIS — N183 Chronic kidney disease, stage 3 (moderate): Secondary | ICD-10-CM | POA: Diagnosis not present

## 2016-10-21 DIAGNOSIS — M48061 Spinal stenosis, lumbar region without neurogenic claudication: Secondary | ICD-10-CM | POA: Diagnosis not present

## 2016-10-21 DIAGNOSIS — Z79891 Long term (current) use of opiate analgesic: Secondary | ICD-10-CM | POA: Diagnosis not present

## 2016-10-21 DIAGNOSIS — G894 Chronic pain syndrome: Secondary | ICD-10-CM | POA: Diagnosis not present

## 2016-10-21 DIAGNOSIS — F330 Major depressive disorder, recurrent, mild: Secondary | ICD-10-CM | POA: Diagnosis not present

## 2016-10-21 DIAGNOSIS — M069 Rheumatoid arthritis, unspecified: Secondary | ICD-10-CM | POA: Diagnosis not present

## 2016-10-21 DIAGNOSIS — E039 Hypothyroidism, unspecified: Secondary | ICD-10-CM | POA: Diagnosis not present

## 2016-10-21 DIAGNOSIS — Z6825 Body mass index (BMI) 25.0-25.9, adult: Secondary | ICD-10-CM | POA: Diagnosis not present

## 2016-10-21 DIAGNOSIS — M109 Gout, unspecified: Secondary | ICD-10-CM | POA: Diagnosis not present

## 2016-10-21 DIAGNOSIS — D519 Vitamin B12 deficiency anemia, unspecified: Secondary | ICD-10-CM | POA: Diagnosis not present

## 2016-10-21 DIAGNOSIS — I4891 Unspecified atrial fibrillation: Secondary | ICD-10-CM | POA: Diagnosis not present

## 2016-10-21 DIAGNOSIS — R2681 Unsteadiness on feet: Secondary | ICD-10-CM | POA: Diagnosis not present

## 2016-10-21 DIAGNOSIS — I10 Essential (primary) hypertension: Secondary | ICD-10-CM | POA: Diagnosis not present

## 2016-10-21 DIAGNOSIS — I480 Paroxysmal atrial fibrillation: Secondary | ICD-10-CM | POA: Diagnosis not present

## 2016-10-21 DIAGNOSIS — M150 Primary generalized (osteo)arthritis: Secondary | ICD-10-CM | POA: Diagnosis not present

## 2016-10-25 DIAGNOSIS — M542 Cervicalgia: Secondary | ICD-10-CM | POA: Diagnosis not present

## 2016-10-25 DIAGNOSIS — G629 Polyneuropathy, unspecified: Secondary | ICD-10-CM | POA: Diagnosis not present

## 2016-10-25 DIAGNOSIS — Z6821 Body mass index (BMI) 21.0-21.9, adult: Secondary | ICD-10-CM | POA: Diagnosis not present

## 2016-10-25 DIAGNOSIS — G959 Disease of spinal cord, unspecified: Secondary | ICD-10-CM | POA: Diagnosis not present

## 2016-10-25 DIAGNOSIS — M5030 Other cervical disc degeneration, unspecified cervical region: Secondary | ICD-10-CM | POA: Diagnosis not present

## 2016-10-29 DIAGNOSIS — M542 Cervicalgia: Secondary | ICD-10-CM | POA: Diagnosis not present

## 2016-10-29 DIAGNOSIS — M5030 Other cervical disc degeneration, unspecified cervical region: Secondary | ICD-10-CM | POA: Diagnosis not present

## 2016-10-29 DIAGNOSIS — M4712 Other spondylosis with myelopathy, cervical region: Secondary | ICD-10-CM | POA: Diagnosis not present

## 2016-11-05 DIAGNOSIS — F330 Major depressive disorder, recurrent, mild: Secondary | ICD-10-CM | POA: Diagnosis not present

## 2016-11-05 DIAGNOSIS — R2681 Unsteadiness on feet: Secondary | ICD-10-CM | POA: Diagnosis not present

## 2016-11-05 DIAGNOSIS — M150 Primary generalized (osteo)arthritis: Secondary | ICD-10-CM | POA: Diagnosis not present

## 2016-11-05 DIAGNOSIS — M069 Rheumatoid arthritis, unspecified: Secondary | ICD-10-CM | POA: Diagnosis not present

## 2016-11-05 DIAGNOSIS — M5416 Radiculopathy, lumbar region: Secondary | ICD-10-CM | POA: Diagnosis not present

## 2016-11-05 DIAGNOSIS — M48061 Spinal stenosis, lumbar region without neurogenic claudication: Secondary | ICD-10-CM | POA: Diagnosis not present

## 2016-11-09 DIAGNOSIS — R2681 Unsteadiness on feet: Secondary | ICD-10-CM | POA: Diagnosis not present

## 2016-11-09 DIAGNOSIS — M48061 Spinal stenosis, lumbar region without neurogenic claudication: Secondary | ICD-10-CM | POA: Diagnosis not present

## 2016-11-09 DIAGNOSIS — M069 Rheumatoid arthritis, unspecified: Secondary | ICD-10-CM | POA: Diagnosis not present

## 2016-11-09 DIAGNOSIS — M150 Primary generalized (osteo)arthritis: Secondary | ICD-10-CM | POA: Diagnosis not present

## 2016-11-09 DIAGNOSIS — M5416 Radiculopathy, lumbar region: Secondary | ICD-10-CM | POA: Diagnosis not present

## 2016-11-09 DIAGNOSIS — F330 Major depressive disorder, recurrent, mild: Secondary | ICD-10-CM | POA: Diagnosis not present

## 2016-11-11 DIAGNOSIS — M069 Rheumatoid arthritis, unspecified: Secondary | ICD-10-CM | POA: Diagnosis not present

## 2016-11-11 DIAGNOSIS — M150 Primary generalized (osteo)arthritis: Secondary | ICD-10-CM | POA: Diagnosis not present

## 2016-11-11 DIAGNOSIS — M48061 Spinal stenosis, lumbar region without neurogenic claudication: Secondary | ICD-10-CM | POA: Diagnosis not present

## 2016-11-11 DIAGNOSIS — F330 Major depressive disorder, recurrent, mild: Secondary | ICD-10-CM | POA: Diagnosis not present

## 2016-11-11 DIAGNOSIS — R2681 Unsteadiness on feet: Secondary | ICD-10-CM | POA: Diagnosis not present

## 2016-11-11 DIAGNOSIS — M5416 Radiculopathy, lumbar region: Secondary | ICD-10-CM | POA: Diagnosis not present

## 2016-11-18 DIAGNOSIS — M48061 Spinal stenosis, lumbar region without neurogenic claudication: Secondary | ICD-10-CM | POA: Diagnosis not present

## 2016-11-18 DIAGNOSIS — M069 Rheumatoid arthritis, unspecified: Secondary | ICD-10-CM | POA: Diagnosis not present

## 2016-11-18 DIAGNOSIS — F330 Major depressive disorder, recurrent, mild: Secondary | ICD-10-CM | POA: Diagnosis not present

## 2016-11-18 DIAGNOSIS — M150 Primary generalized (osteo)arthritis: Secondary | ICD-10-CM | POA: Diagnosis not present

## 2016-11-18 DIAGNOSIS — M5416 Radiculopathy, lumbar region: Secondary | ICD-10-CM | POA: Diagnosis not present

## 2016-11-18 DIAGNOSIS — R2681 Unsteadiness on feet: Secondary | ICD-10-CM | POA: Diagnosis not present

## 2016-11-19 DIAGNOSIS — R600 Localized edema: Secondary | ICD-10-CM | POA: Diagnosis not present

## 2016-11-19 DIAGNOSIS — G894 Chronic pain syndrome: Secondary | ICD-10-CM | POA: Diagnosis not present

## 2016-11-19 DIAGNOSIS — I10 Essential (primary) hypertension: Secondary | ICD-10-CM | POA: Diagnosis not present

## 2016-11-19 DIAGNOSIS — N189 Chronic kidney disease, unspecified: Secondary | ICD-10-CM | POA: Diagnosis not present

## 2016-11-23 DIAGNOSIS — M48061 Spinal stenosis, lumbar region without neurogenic claudication: Secondary | ICD-10-CM | POA: Diagnosis not present

## 2016-11-23 DIAGNOSIS — M069 Rheumatoid arthritis, unspecified: Secondary | ICD-10-CM | POA: Diagnosis not present

## 2016-11-23 DIAGNOSIS — F330 Major depressive disorder, recurrent, mild: Secondary | ICD-10-CM | POA: Diagnosis not present

## 2016-11-23 DIAGNOSIS — M150 Primary generalized (osteo)arthritis: Secondary | ICD-10-CM | POA: Diagnosis not present

## 2016-11-23 DIAGNOSIS — R2681 Unsteadiness on feet: Secondary | ICD-10-CM | POA: Diagnosis not present

## 2016-11-23 DIAGNOSIS — M5416 Radiculopathy, lumbar region: Secondary | ICD-10-CM | POA: Diagnosis not present

## 2016-11-25 DIAGNOSIS — M5416 Radiculopathy, lumbar region: Secondary | ICD-10-CM | POA: Diagnosis not present

## 2016-11-25 DIAGNOSIS — M48061 Spinal stenosis, lumbar region without neurogenic claudication: Secondary | ICD-10-CM | POA: Diagnosis not present

## 2016-11-25 DIAGNOSIS — M069 Rheumatoid arthritis, unspecified: Secondary | ICD-10-CM | POA: Diagnosis not present

## 2016-11-25 DIAGNOSIS — F330 Major depressive disorder, recurrent, mild: Secondary | ICD-10-CM | POA: Diagnosis not present

## 2016-11-25 DIAGNOSIS — M150 Primary generalized (osteo)arthritis: Secondary | ICD-10-CM | POA: Diagnosis not present

## 2016-11-25 DIAGNOSIS — R2681 Unsteadiness on feet: Secondary | ICD-10-CM | POA: Diagnosis not present

## 2016-11-26 DIAGNOSIS — N189 Chronic kidney disease, unspecified: Secondary | ICD-10-CM | POA: Diagnosis not present

## 2016-11-26 DIAGNOSIS — R600 Localized edema: Secondary | ICD-10-CM | POA: Diagnosis not present

## 2016-12-06 DIAGNOSIS — M47812 Spondylosis without myelopathy or radiculopathy, cervical region: Secondary | ICD-10-CM | POA: Diagnosis not present

## 2016-12-06 DIAGNOSIS — M542 Cervicalgia: Secondary | ICD-10-CM | POA: Diagnosis not present

## 2016-12-06 DIAGNOSIS — M2560 Stiffness of unspecified joint, not elsewhere classified: Secondary | ICD-10-CM | POA: Diagnosis not present

## 2016-12-06 DIAGNOSIS — M6281 Muscle weakness (generalized): Secondary | ICD-10-CM | POA: Diagnosis not present

## 2016-12-09 DIAGNOSIS — M5030 Other cervical disc degeneration, unspecified cervical region: Secondary | ICD-10-CM | POA: Diagnosis not present

## 2016-12-09 DIAGNOSIS — M542 Cervicalgia: Secondary | ICD-10-CM | POA: Diagnosis not present

## 2016-12-09 DIAGNOSIS — G8929 Other chronic pain: Secondary | ICD-10-CM | POA: Diagnosis not present

## 2016-12-10 DIAGNOSIS — M542 Cervicalgia: Secondary | ICD-10-CM | POA: Diagnosis not present

## 2016-12-10 DIAGNOSIS — M47812 Spondylosis without myelopathy or radiculopathy, cervical region: Secondary | ICD-10-CM | POA: Diagnosis not present

## 2016-12-10 DIAGNOSIS — M6281 Muscle weakness (generalized): Secondary | ICD-10-CM | POA: Diagnosis not present

## 2016-12-10 DIAGNOSIS — M2560 Stiffness of unspecified joint, not elsewhere classified: Secondary | ICD-10-CM | POA: Diagnosis not present

## 2016-12-15 DIAGNOSIS — M47812 Spondylosis without myelopathy or radiculopathy, cervical region: Secondary | ICD-10-CM | POA: Diagnosis not present

## 2016-12-15 DIAGNOSIS — M6281 Muscle weakness (generalized): Secondary | ICD-10-CM | POA: Diagnosis not present

## 2016-12-15 DIAGNOSIS — M542 Cervicalgia: Secondary | ICD-10-CM | POA: Diagnosis not present

## 2016-12-15 DIAGNOSIS — M2560 Stiffness of unspecified joint, not elsewhere classified: Secondary | ICD-10-CM | POA: Diagnosis not present

## 2016-12-16 DIAGNOSIS — M2560 Stiffness of unspecified joint, not elsewhere classified: Secondary | ICD-10-CM | POA: Diagnosis not present

## 2016-12-16 DIAGNOSIS — M47812 Spondylosis without myelopathy or radiculopathy, cervical region: Secondary | ICD-10-CM | POA: Diagnosis not present

## 2016-12-16 DIAGNOSIS — M542 Cervicalgia: Secondary | ICD-10-CM | POA: Diagnosis not present

## 2016-12-16 DIAGNOSIS — M6281 Muscle weakness (generalized): Secondary | ICD-10-CM | POA: Diagnosis not present

## 2017-02-15 DIAGNOSIS — M549 Dorsalgia, unspecified: Secondary | ICD-10-CM | POA: Diagnosis not present

## 2017-02-21 DIAGNOSIS — G459 Transient cerebral ischemic attack, unspecified: Secondary | ICD-10-CM | POA: Diagnosis not present

## 2017-02-21 DIAGNOSIS — G4489 Other headache syndrome: Secondary | ICD-10-CM | POA: Diagnosis not present

## 2017-02-21 DIAGNOSIS — R4182 Altered mental status, unspecified: Secondary | ICD-10-CM | POA: Diagnosis not present

## 2017-02-21 DIAGNOSIS — I10 Essential (primary) hypertension: Secondary | ICD-10-CM | POA: Diagnosis not present

## 2017-02-21 DIAGNOSIS — F0390 Unspecified dementia without behavioral disturbance: Secondary | ICD-10-CM | POA: Diagnosis not present

## 2017-02-21 DIAGNOSIS — R4702 Dysphasia: Secondary | ICD-10-CM | POA: Diagnosis not present

## 2017-02-21 DIAGNOSIS — M549 Dorsalgia, unspecified: Secondary | ICD-10-CM | POA: Diagnosis not present

## 2017-02-21 DIAGNOSIS — N183 Chronic kidney disease, stage 3 (moderate): Secondary | ICD-10-CM | POA: Diagnosis not present

## 2017-02-21 DIAGNOSIS — M6281 Muscle weakness (generalized): Secondary | ICD-10-CM | POA: Diagnosis not present

## 2017-02-21 DIAGNOSIS — Z79899 Other long term (current) drug therapy: Secondary | ICD-10-CM | POA: Diagnosis not present

## 2017-02-21 DIAGNOSIS — F29 Unspecified psychosis not due to a substance or known physiological condition: Secondary | ICD-10-CM | POA: Diagnosis not present

## 2017-02-21 DIAGNOSIS — M2560 Stiffness of unspecified joint, not elsewhere classified: Secondary | ICD-10-CM | POA: Diagnosis not present

## 2017-02-21 DIAGNOSIS — Z79891 Long term (current) use of opiate analgesic: Secondary | ICD-10-CM | POA: Diagnosis not present

## 2017-02-21 DIAGNOSIS — M542 Cervicalgia: Secondary | ICD-10-CM | POA: Diagnosis not present

## 2017-02-21 DIAGNOSIS — J9811 Atelectasis: Secondary | ICD-10-CM | POA: Diagnosis not present

## 2017-02-21 DIAGNOSIS — E039 Hypothyroidism, unspecified: Secondary | ICD-10-CM | POA: Diagnosis not present

## 2017-02-21 DIAGNOSIS — M47812 Spondylosis without myelopathy or radiculopathy, cervical region: Secondary | ICD-10-CM | POA: Diagnosis not present

## 2017-02-21 DIAGNOSIS — I129 Hypertensive chronic kidney disease with stage 1 through stage 4 chronic kidney disease, or unspecified chronic kidney disease: Secondary | ICD-10-CM | POA: Diagnosis not present

## 2017-02-21 LAB — PROTIME-INR

## 2017-02-22 ENCOUNTER — Telehealth: Payer: Self-pay

## 2017-02-22 DIAGNOSIS — I6789 Other cerebrovascular disease: Secondary | ICD-10-CM | POA: Diagnosis not present

## 2017-02-22 DIAGNOSIS — I639 Cerebral infarction, unspecified: Secondary | ICD-10-CM | POA: Diagnosis not present

## 2017-02-22 DIAGNOSIS — R410 Disorientation, unspecified: Secondary | ICD-10-CM | POA: Diagnosis not present

## 2017-02-22 NOTE — Telephone Encounter (Signed)
-----   Message from Frederic Jericho sent at 02/22/2017  3:03 PM EDT ----- Regarding: TCM call  Patient is being dc'd from [facility] this afternoon and scheduled with Dr. Agustin Cree for 02/24/2017 , please call patient with TCM follow up.Marland KitchenMarland Kitchen

## 2017-02-22 NOTE — Telephone Encounter (Signed)
Patient contacted regarding discharge from [facility] on Sept.18,2018.    Left Message to return call for TCM f/u.

## 2017-02-23 NOTE — Telephone Encounter (Signed)
Unable to reach pt or leave message to return call for TCM follow up.

## 2017-02-24 ENCOUNTER — Ambulatory Visit: Payer: Medicare Other | Admitting: Cardiology

## 2017-02-24 DIAGNOSIS — N183 Chronic kidney disease, stage 3 unspecified: Secondary | ICD-10-CM | POA: Insufficient documentation

## 2017-02-24 DIAGNOSIS — M109 Gout, unspecified: Secondary | ICD-10-CM | POA: Insufficient documentation

## 2017-02-28 ENCOUNTER — Encounter: Payer: Self-pay | Admitting: Cardiology

## 2017-02-28 ENCOUNTER — Ambulatory Visit (INDEPENDENT_AMBULATORY_CARE_PROVIDER_SITE_OTHER): Payer: Medicare Other | Admitting: Cardiology

## 2017-02-28 VITALS — BP 134/66 | HR 60 | Ht 70.0 in | Wt 152.8 lb

## 2017-02-28 DIAGNOSIS — G454 Transient global amnesia: Secondary | ICD-10-CM

## 2017-02-28 DIAGNOSIS — I429 Cardiomyopathy, unspecified: Secondary | ICD-10-CM | POA: Diagnosis not present

## 2017-02-28 DIAGNOSIS — Z7901 Long term (current) use of anticoagulants: Secondary | ICD-10-CM | POA: Insufficient documentation

## 2017-02-28 DIAGNOSIS — F0390 Unspecified dementia without behavioral disturbance: Secondary | ICD-10-CM | POA: Diagnosis not present

## 2017-02-28 DIAGNOSIS — G459 Transient cerebral ischemic attack, unspecified: Secondary | ICD-10-CM | POA: Insufficient documentation

## 2017-02-28 DIAGNOSIS — I480 Paroxysmal atrial fibrillation: Secondary | ICD-10-CM

## 2017-02-28 MED ORDER — LISINOPRIL 2.5 MG PO TABS: 2.5000 mg | ORAL_TABLET | Freq: Every day | ORAL | 6 refills | Status: DC

## 2017-02-28 NOTE — Progress Notes (Signed)
Cardiology Office Note:    Date:  02/28/2017   ID:  Thomas James., DOB 03-Mar-1925, MRN [id number]  PCP:  Townsend Roger, MD  Cardiologist:  Jenne Campus, MD    Referring MD: Nona Dell, Corene Cornea, MD   Chief Complaint  Patient presents with  . New Patient (Initial Visit)  . Hospitalization Follow-up    dx Mild Stroke   I'm doing better  History of Present Illness:    Thomas Haugen [name] is a 83 y.o. male  Was recently in the [facility] after he suffered what appears to be TIA apparently he became confused and started having difficulty speaking. He was brought to Hospital CT and MRI was done and both were negative and adually improved. Carotid ultrasound did not show any critical lesions, monitoring failed to reveal and atrial fibrillation. Echocardiogram showed only mildly enlarged left atrium. He comes to me with a question about anticoagulation. Previously he had episodes of atrial fibrillation only after he was given steroids. I would blame the episodes of breath on steroids. Now because of his advanced age and things will be critically essential to know if he truly does have atrial fibrillation. We talked about options in this situation opon being simply anticoagulating him but because of his funds HDL some significant risks, we talked about putting external evener again he is taking statin to be started talking about implantable loop recorder and this is something that he would like to have. This is quite complex and area I will ask him to see EP with consideration of implantable loop recorder.  Past Medical History:  Diagnosis Date  . Atrial fibrillation ([place])   . Chronic kidney disease    Stage 3  . Dementia   . Hypertension   . Neuropathy   . Thyroid disease   . TIA (transient ischemic attack)     Past Surgical History:  Procedure Laterality Date  . BACK SURGERY    . CHOLECYSTECTOMY    . HIP SURGERY    . KNEE SURGERY    . SHOULDER SURGERY      Current  Medications: Current Meds  Medication Sig  . allopurinol (ZYLOPRIM) 100 MG tablet Take 1 tablet by mouth daily.  Marland Kitchen aspirin EC 81 MG tablet Take 81 mg by mouth daily.  Marland Kitchen atorvastatin (LIPITOR) 10 MG tablet Take 10 mg by mouth daily.  . calcium carbonate (CALCIUM 600) 600 MG TABS tablet Take 600 mg by mouth daily.  . furosemide (LASIX) 20 MG tablet Take 20 mg by mouth daily as needed.  . gabapentin (NEURONTIN) 600 MG tablet Take 600 mg by mouth 4 (four) times daily.  Marland Kitchen HYDROcodone-acetaminophen (NORCO/VICODIN) 5-325 MG tablet Take 1 tablet by mouth every 6 (six) hours as needed for moderate pain.  Marland Kitchen levothyroxine (SYNTHROID, LEVOTHROID) 25 MCG tablet Take 1 tablet by mouth daily.  . metoprolol tartrate (LOPRESSOR) 25 MG tablet Take 12.5 mg by mouth 2 (two) times daily.  . Multiple Vitamins-Minerals (MULTIVITAMIN MEN 50+) TABS Take by mouth.  . oxyCODONE (OXY IR/ROXICODONE) 5 MG immediate release tablet Take 5 mg by mouth every 6 (six) hours.  . sertraline (ZOLOFT) 25 MG tablet Take 25 mg by mouth daily.  . VOLTAREN 1 % GEL Apply 1 application topically daily as needed.     Allergies:   Prednisone and Penicillins   Social History   Social History  . Marital status: Widowed    Spouse name: N/A  . Number of children: N/A  . Years  of education: N/A   Social History Main Topics  . Smoking status: Never Smoker  . Smokeless tobacco: Never Used  . Alcohol use No  . Drug use: No  . Sexual activity: Not Asked   Other Topics Concern  . None   Social History Narrative  . None     Family History: The patient's family history includes CAD in his brother; Cancer in his father. ROS:   Please see the history of present illness.    All 14 point review of systems negative except as described per history of present illness  EKGs/Labs/Other Studies Reviewed:      Recent Labs: No results found for requested labs within last 8760 hours.  Recent Lipid Panel No results found for: CHOL,  TRIG, HDL, CHOLHDL, VLDL, LDLCALC, LDLDIRECT  Physical Exam:    VS:  BP 134/66   Pulse 60   Ht 5\' 10"  (1.778 m)   Wt 152 lb 12.8 oz (69.3 kg)   SpO2 98%   BMI 21.92 kg/m     Wt Readings from Last 3 Encounters:  02/28/17 152 lb 12.8 oz (69.3 kg)     GEN:  Well nourished, well developed in no acute distress HEENT: Normal NECK: No JVD; No carotid bruits LYMPHATICS: No lymphadenopathy CARDIAC: RRR, no murmurs, no rubs, no gallops RESPIRATORY:  Clear to auscultation without rales, wheezing or rhonchi  ABDOMEN: Soft, non-tender, non-distended MUSCULOSKELETAL:  No edema; No deformity  SKIN: Warm and dry LOWER EXTREMITIES: no swelling NEUROLOGIC:  Alert and oriented x 3 PSYCHIATRIC:  Normal affect   ASSESSMENT:    1. Paroxysmal atrial fibrillation (HCC)   2. Chronic anticoagulation   3. Dementia without behavioral disturbance, unspecified dementia type   4. Cardiomyopathy, unspecified type ([place])   5. Transient global amnesia    PLAN:    In order of problems listed above:  1. Recent TIA: We will pursue implantable loop recorder to decide about potential anticoagulation. 2. Cardiomyopathy: His ejection fraction was mildly diminished in the hospital. He is already on metoprolol which I will continue I will ask him to have Chem-7 checked to see if there is any role for that medication, however because of his age conservative approach will be the correct one. 3. Chronic kidney dysfunction we'll check his Chem-7 today. 4. Dementia: Only mild    Medication Adjustments/Labs and Tests Ordered: Current medicines are reviewed at length with the patient today.  Concerns regarding medicines are outlined above.  No orders of the defined types were placed in this encounter.  Medication changes:  Meds ordered this encounter  Medications  . lisinopril (PRINIVIL,ZESTRIL) 2.5 MG tablet    Sig: Take 1 tablet (2.5 mg total) by mouth daily.    Dispense:  30 tablet    Refill:  6     Signed, Thomas Liter, MD, [facility] 02/28/2017 4:51 PM    [place]

## 2017-02-28 NOTE — Progress Notes (Deleted)
Cardiology Office Note:    Date:  02/28/2017   ID:  Thomas James., DOB 30-Dec-1924, MRN [id number]  PCP:  Townsend Roger, MD  Cardiologist:  Shirlee More, MD    Referring MD: Townsend Roger, MD    ASSESSMENT:    1. Chronic anticoagulation   2. Dementia without behavioral disturbance, unspecified dementia type   3. Cardiomyopathy, unspecified type ([place])    PLAN:    In order of problems listed above:  1. ***   Next appointment: ***   Medication Adjustments/Labs and Tests Ordered: Current medicines are reviewed at length with the patient today.  Concerns regarding medicines are outlined above.  No orders of the defined types were placed in this encounter.  No orders of the defined types were placed in this encounter.   No chief complaint on file.   History of Present Illness:    Thomas Hojnacki [name] is a 83 y.o. male with a hx of atrial fibrillation, CHADS 2 of 3, hypertension, CKD and dementia recent admission to [facility] 02/21/17 with AMS felt to be due to dementia or TIA by neurology last seen 10/21/16.His echo showed LV EF% 35-40. Compliance with diet, lifestyle and medications: *** Past Medical History:  Diagnosis Date  . Atrial fibrillation ([place])   . Chronic kidney disease    Stage 3  . Dementia   . Hypertension   . Neuropathy   . Thyroid disease   . TIA (transient ischemic attack)     Past Surgical History:  Procedure Laterality Date  . BACK SURGERY    . CHOLECYSTECTOMY    . HIP SURGERY    . KNEE SURGERY    . SHOULDER SURGERY      Current Medications: No outpatient prescriptions have been marked as taking for the 02/28/17 encounter (Appointment) with Richardo Priest, MD.     Allergies:   Prednisone and Penicillins   Social History   Social History  . Marital status: Widowed    Spouse name: N/A  . Number of children: N/A  . Years of education: N/A   Social History Main Topics  . Smoking status: Never Smoker  . Smokeless tobacco: Never Used  . Alcohol  use No  . Drug use: No  . Sexual activity: Not on file   Other Topics Concern  . Not on file   Social History Narrative  . No narrative on file     Family History: The patient's ***family history includes CAD in his brother; Cancer in his father. ROS:   Please see the history of present illness.    All other systems reviewed and are negative.  EKGs/Labs/Other Studies Reviewed:    The following studies were reviewed today:  EKG:  EKG  02/21/17 [place] nonspecific T waves MRI with advanced chronic ischemic disease no acute stroke seen  Recent Labs: troponin 0.20 BNP 7640 Cr 2.1 No results found for requested labs within last 8760 hours.  Recent Lipid Panel No results found for: CHOL, TRIG, HDL, CHOLHDL, VLDL, LDLCALC, LDLDIRECT  Physical Exam:    VS:  There were no vitals taken for this visit.    Wt Readings from Last 3 Encounters:  No data found for Wt     GEN: *** Well nourished, well developed in no acute distress HEENT: Normal NECK: No JVD; No carotid bruits LYMPHATICS: No lymphadenopathy CARDIAC: ***RRR, no murmurs, rubs, gallops RESPIRATORY:  Clear to auscultation without rales, wheezing or rhonchi  ABDOMEN: Soft, non-tender, non-distended MUSCULOSKELETAL:  No  edema; No deformity  SKIN: Warm and dry NEUROLOGIC:  Alert and oriented x 3 PSYCHIATRIC:  Normal affect    Signed, Shirlee More, MD  02/28/2017 7:34 AM    [place]

## 2017-02-28 NOTE — Patient Instructions (Signed)
Medication Instructions:  Your physician has recommended you make the following change in your medication:  1.) RESTART Lisinopril 2.5 mg daily. A new RX has been sent to the pharmacy for this!  Labwork: None   Testing/Procedures: None   Follow-Up: Your physician recommends that you schedule a follow-up appointment in: 2 months. You will also receive a call from Dr. Macky Lower team regarding an appointment to see him to discuss a loop recorder.   Any Other Special Instructions Will Be Listed Below (If Applicable).  Please note that any paperwork needing to be filled out by the provider will need to be addressed at the front desk prior to seeing the provider. Please note that any paperwork FMLA, Disability or other documents regarding health condition is subject to a $25.00 charge that must be received prior to completion of paperwork in the form of a money order or check.    If you need a refill on your cardiac medications before your next appointment, please call your pharmacy.

## 2017-03-24 DIAGNOSIS — G894 Chronic pain syndrome: Secondary | ICD-10-CM | POA: Diagnosis not present

## 2017-03-24 DIAGNOSIS — R5383 Other fatigue: Secondary | ICD-10-CM | POA: Diagnosis not present

## 2017-03-24 DIAGNOSIS — I4891 Unspecified atrial fibrillation: Secondary | ICD-10-CM | POA: Diagnosis not present

## 2017-03-29 DIAGNOSIS — L570 Actinic keratosis: Secondary | ICD-10-CM | POA: Diagnosis not present

## 2017-03-29 DIAGNOSIS — L821 Other seborrheic keratosis: Secondary | ICD-10-CM | POA: Diagnosis not present

## 2017-03-29 DIAGNOSIS — L578 Other skin changes due to chronic exposure to nonionizing radiation: Secondary | ICD-10-CM | POA: Diagnosis not present

## 2017-04-04 ENCOUNTER — Telehealth: Payer: Self-pay | Admitting: Cardiology

## 2017-04-04 NOTE — Telephone Encounter (Signed)
S/w [name] and discussed her concerns. I told her I felt it would be better if we got the pt in for a sooner appointment so that this could be discussed in person with Dr. Agustin Cree. Appointment was arranged for this Thursday. [name] is agreeable to this appointment and will let us know if anything changes that the appointment can not be kept.

## 2017-04-04 NOTE — Telephone Encounter (Signed)
Doesn't think his PCP is treating his issues like they should be and wants Dr Raliegh Ip to know about it

## 2017-04-07 ENCOUNTER — Ambulatory Visit (INDEPENDENT_AMBULATORY_CARE_PROVIDER_SITE_OTHER): Payer: Medicare Other | Admitting: Cardiology

## 2017-04-07 ENCOUNTER — Encounter: Payer: Self-pay | Admitting: Cardiology

## 2017-04-07 VITALS — BP 120/70 | HR 60 | Resp 10 | Ht 70.0 in | Wt 151.4 lb

## 2017-04-07 DIAGNOSIS — I480 Paroxysmal atrial fibrillation: Secondary | ICD-10-CM | POA: Diagnosis not present

## 2017-04-07 DIAGNOSIS — G459 Transient cerebral ischemic attack, unspecified: Secondary | ICD-10-CM | POA: Diagnosis not present

## 2017-04-07 DIAGNOSIS — F0390 Unspecified dementia without behavioral disturbance: Secondary | ICD-10-CM

## 2017-04-07 NOTE — Progress Notes (Signed)
Cardiology Office Note:    Date:  04/07/2017   ID:  Thomas Hooper., DOB 1924/12/10, MRN [id number]  PCP:  Townsend Roger, MD  Cardiologist:  Jenne Campus, MD    Referring MD: Nona Dell, Corene Cornea, MD   Chief Complaint  Patient presents with  . 2 month follow up  I am doing well  History of Present Illness:    Thomas Hooper. is a 83 y.o. male with history of paroxysmal atrial fibrillation.  He denies having any palpitations.  He is off anticoagulation because of some periods of confusion.  Recently however he was in the hospital because of episode of TIA.  I think reached the point that knowing if he still have recurrences of atrial fibrillation will be absolutely essential.  Ideally because of recent history of TIA as well as his age and hypertension he needs to be on anticoagulation however because of his age and simply of confusion that became problematic.  We talking lens to him as well as to his family about what to do with the situation and they interesting in having implantable loop recorder.  The device will clarify how much of atrial fibrillation he does have.  Meaning if he still have recurrences of atrial fibrillation he need to be anticoagulated.  I investigated a little bit.  Of confusion and I realized that he was given hydrocodone by his daughter for neck pain.  While the Tylenol helps with the pain however he has to take 6 tablets of extra strength Tylenol to help with it.  Therefore very appropriately primary care physician gave him hydrocodone.  Obviously that backfired.  He became confused while taking this medication.  Recommended to reduce dose of extra strength Tylenol to no more than 4 tablets every single day plus using Motrin cautiously on top of Tylenol.  I told him to use no more than 2 Motrin's 200 mg every day and I want to make sure they take it with food.  Past Medical History:  Diagnosis Date  . Atrial fibrillation ([place])   . Chronic kidney disease    Stage 3  .  Dementia   . Hypertension   . Neuropathy   . Thyroid disease   . TIA (transient ischemic attack)     Past Surgical History:  Procedure Laterality Date  . BACK SURGERY    . CHOLECYSTECTOMY    . HIP SURGERY    . KNEE SURGERY    . SHOULDER SURGERY      Current Medications: Current Meds  Medication Sig  . allopurinol (ZYLOPRIM) 100 MG tablet Take 1 tablet by mouth daily.  Marland Kitchen aspirin EC 81 MG tablet Take 81 mg by mouth daily.  Marland Kitchen atorvastatin (LIPITOR) 10 MG tablet Take 10 mg by mouth daily.  . calcium carbonate (CALCIUM 600) 600 MG TABS tablet Take 600 mg by mouth daily.  . furosemide (LASIX) 20 MG tablet Take 20 mg by mouth daily as needed.  . gabapentin (NEURONTIN) 600 MG tablet Take 600 mg by mouth 4 (four) times daily.  Marland Kitchen levothyroxine (SYNTHROID, LEVOTHROID) 25 MCG tablet Take 1 tablet by mouth daily.  Marland Kitchen lisinopril (PRINIVIL,ZESTRIL) 2.5 MG tablet Take 1 tablet (2.5 mg total) by mouth daily.  . metoprolol tartrate (LOPRESSOR) 25 MG tablet Take 12.5 mg by mouth 2 (two) times daily.  . Multiple Vitamins-Minerals (MULTIVITAMIN MEN 50+) TABS Take by mouth.  . sertraline (ZOLOFT) 25 MG tablet Take 25 mg by mouth daily.  . VOLTAREN 1 %  GEL Apply 1 application topically daily as needed.     Allergies:   Prednisone and Penicillins   Social History   Social History  . Marital status: Widowed    Spouse name: N/A  . Number of children: N/A  . Years of education: N/A   Social History Main Topics  . Smoking status: Never Smoker  . Smokeless tobacco: Never Used  . Alcohol use No  . Drug use: No  . Sexual activity: Not Asked   Other Topics Concern  . None   Social History Narrative  . None     Family History: The patient's family history includes CAD in his brother; Cancer in his father. ROS:   Please see the history of present illness.    All 14 point review of systems negative except as described per history of present illness  EKGs/Labs/Other Studies Reviewed:       Recent Labs: No results found for requested labs within last 8760 hours.  Recent Lipid Panel No results found for: CHOL, TRIG, HDL, CHOLHDL, VLDL, LDLCALC, LDLDIRECT  Physical Exam:    VS:  BP 120/70   Pulse 60   Resp 10   Ht 5\' 10"  (1.778 m)   Wt 151 lb 6.4 oz (68.7 kg)   BMI 21.72 kg/m     Wt Readings from Last 3 Encounters:  04/07/17 151 lb 6.4 oz (68.7 kg)  02/28/17 152 lb 12.8 oz (69.3 kg)     GEN:  Well nourished, well developed in no acute distress HEENT: Normal NECK: No JVD; No carotid bruits LYMPHATICS: No lymphadenopathy CARDIAC: RRR, no murmurs, no rubs, no gallops RESPIRATORY:  Clear to auscultation without rales, wheezing or rhonchi  ABDOMEN: Soft, non-tender, non-distended MUSCULOSKELETAL:  No edema; No deformity  SKIN: Warm and dry LOWER EXTREMITIES: no swelling NEUROLOGIC:  Alert and oriented x 3 PSYCHIATRIC:  Normal affect   ASSESSMENT:    1. Dementia without behavioral disturbance, unspecified dementia type   2. TIA (transient ischemic attack)   3. Paroxysmal atrial fibrillation (HCC)    PLAN:    In order of problems listed above:  1. Dementia: He is perfectly fine as long as he does not take any narcotics. 2. TIA: No new episodes discussion about implantable loop recorder to decide about anticoagulation is ongoing. 3. Paroxysmal atrial fibrillation: Seems to be in sinus rhythm today.   Medication Adjustments/Labs and Tests Ordered: Current medicines are reviewed at length with the patient today.  Concerns regarding medicines are outlined above.  No orders of the defined types were placed in this encounter.  Medication changes: No orders of the defined types were placed in this encounter.   Signed, Park Liter, MD, [facility] 04/07/2017 11:28 AM    [place]

## 2017-04-07 NOTE — Patient Instructions (Addendum)
Medication Instructions:  Your physician recommends that you continue on your current medications as directed. Please refer to the Current Medication list given to you today.  Labwork: None   Testing/Procedures: None   Follow-Up: Your physician recommends that you schedule a follow-up appointment in: 3 month   Any Other Special Instructions Will Be Listed Below (If Applicable).  Please note that any paperwork needing to be filled out by the provider will need to be addressed at the front desk prior to seeing the provider. Please note that any paperwork FMLA, Disability or other documents regarding health condition is subject to a $25.00 charge that must be received prior to completion of paperwork in the form of a money order or check.    If you need a refill on your cardiac medications before your next appointment, please call your pharmacy.

## 2017-04-25 DIAGNOSIS — G894 Chronic pain syndrome: Secondary | ICD-10-CM | POA: Diagnosis not present

## 2017-04-25 DIAGNOSIS — I4891 Unspecified atrial fibrillation: Secondary | ICD-10-CM | POA: Diagnosis not present

## 2017-04-27 ENCOUNTER — Encounter: Payer: Self-pay | Admitting: Cardiology

## 2017-04-27 ENCOUNTER — Ambulatory Visit (INDEPENDENT_AMBULATORY_CARE_PROVIDER_SITE_OTHER): Payer: Medicare Other | Admitting: Cardiology

## 2017-04-27 VITALS — BP 162/66 | HR 60 | Ht 70.0 in | Wt 151.0 lb

## 2017-04-27 DIAGNOSIS — G459 Transient cerebral ischemic attack, unspecified: Secondary | ICD-10-CM | POA: Diagnosis not present

## 2017-04-27 DIAGNOSIS — I10 Essential (primary) hypertension: Secondary | ICD-10-CM | POA: Diagnosis not present

## 2017-04-27 NOTE — Progress Notes (Signed)
Electrophysiology Office Note   Date:  04/27/2017   ID:  Deatra James., DOB [birth year], MRN [id number]  PCP:  Townsend Roger, MD  Cardiologist:  Agustin Cree Primary Electrophysiologist:  Janyra Barillas Meredith Leeds, MD    Chief Complaint  Patient presents with  . Advice Only    Stroke/Discuss loop recorder  . Shortness of Breath  . Dizziness     History of Present Illness: Essam Munos [name] is a 83 y.o. male who is being seen today for the evaluation of TIA at the request of Townsend Roger, MD. Presenting today for electrophysiology evaluation.  He has a history of dementia, hypertension, TIA, and  stage III CKD.  He has a chart history of atrial fibrillation, but in discussion with his cardiologist, this is an unclear diagnosis.  He had a TIA, but due to multiple issues anticoagulation is problematic.  He is being referred for evaluation of a possible link implantation to further clarify his atrial fibrillation burden.  This Raphaella Larkin potentially aid with anticoagulation decisions.  Today, he denies symptoms of palpitations, chest pain, shortness of breath, orthopnea, PND, lower extremity edema, claudication, dizziness, presyncope, syncope, bleeding, or neurologic sequela. The patient is tolerating medications without difficulties.    Past Medical History:  Diagnosis Date  . Atrial fibrillation ([place])   . Chronic kidney disease    Stage 3  . Dementia   . Hypertension   . Neuropathy   . Thyroid disease   . TIA (transient ischemic attack)    Past Surgical History:  Procedure Laterality Date  . BACK SURGERY    . CHOLECYSTECTOMY    . HIP SURGERY    . KNEE SURGERY    . SHOULDER SURGERY       Current Outpatient Medications  Medication Sig Dispense Refill  . allopurinol (ZYLOPRIM) 100 MG tablet Take 1 tablet by mouth daily.    Marland Kitchen aspirin EC 81 MG tablet Take 81 mg by mouth daily.    Marland Kitchen atorvastatin (LIPITOR) 10 MG tablet Take 10 mg by mouth daily.    . calcium carbonate (CALCIUM 600) 600 MG  TABS tablet Take 600 mg by mouth daily.    Marland Kitchen gabapentin (NEURONTIN) 600 MG tablet Take 600 mg by mouth 4 (four) times daily.    Marland Kitchen levothyroxine (SYNTHROID, LEVOTHROID) 25 MCG tablet Take 1 tablet by mouth daily.    Marland Kitchen lisinopril (PRINIVIL,ZESTRIL) 2.5 MG tablet Take 1 tablet (2.5 mg total) by mouth daily. 30 tablet 6  . metoprolol tartrate (LOPRESSOR) 25 MG tablet Take 12.5 mg by mouth 2 (two) times daily.    . Multiple Vitamins-Minerals (MULTIVITAMIN MEN 50+) TABS Take by mouth.    . sertraline (ZOLOFT) 25 MG tablet Take 25 mg by mouth daily.    . VOLTAREN 1 % GEL Apply 1 application topically daily as needed.     No current facility-administered medications for this visit.     Allergies:   Prednisone and Penicillins   Social History:  The patient  reports that  has never smoked. he has never used smokeless tobacco. He reports that he does not drink alcohol or use drugs.   Family History:  The patient's family history includes CAD in his brother; Cancer in his father.    ROS:  Please see the history of present illness.   Otherwise, review of systems is positive for none.   All other systems are reviewed and negative.    PHYSICAL EXAM: VS:  BP (!) 162/66   Pulse  60   Ht 5\' 10"  (1.778 m)   Wt 151 lb (68.5 kg)   SpO2 99%   BMI 21.67 kg/m  , BMI Body mass index is 21.67 kg/m. GEN: Well nourished, well developed, in no acute distress  HEENT: normal  Neck: no JVD, carotid bruits, or masses Cardiac: RRR; no murmurs, rubs, or gallops,no edema  Respiratory:  clear to auscultation bilaterally, normal work of breathing GI: soft, nontender, nondistended, + BS MS: no deformity or atrophy  Skin: warm and dry Neuro:  Strength and sensation are intact Psych: euthymic mood, full affect  EKG:  EKG is not ordered today. Personal review of the ekg ordered 7/19/18shows sins rhyth, ate 69  Recent Labs: No results found for requested labs within last 8760 hours.    Lipid Panel  No results  found for: CHOL, TRIG, HDL, CHOLHDL, VLDL, LDLCALC, LDLDIRECT   Wt Readings from Last 3 Encounters:  04/27/17 151 lb (68.5 kg)  04/07/17 151 lb 6.4 oz (68.7 kg)  02/28/17 152 lb 12.8 oz (69.3 kg)      Other studies Reviewed: Additional studies/ records that were reviewed today include: 30 day monitor  Review of the above records today demonstrates:  1.Ventricular: No None  2.Supraventricular: No None  3.Bradyarrhythmias and Pauses: No   ASSESSMENT AND PLAN:  1.  Transient ischemic attack: Has a chart history of atrial fibrillation, but in discussion with his primary cardiologist, it is unclear as to the burden or if this is a correct diagnosis.  He is not anticoagulated likely due to the fact that he has dementia and his advanced age.  I discussed with him and his family the possibility of link implantation.  Risks and benefits discussed.  Risks include bleeding and infection.  The patient and family understand the risks and have agreed to the procedure.  Should he have more atrial fibrillation noted on his link monitor, he would benefit from anticoagulation.  2.  Hypertension: Pressure is elevated today.  Has been normal on past visits.  Isidore Margraf make no changes today.   Current medicines are reviewed at length with the patient today.   The patient does not have concerns regarding his medicines.  The following changes were made today:  none  Labs/ tests ordered today include:  No orders of the defined types were placed in this encounter.    Disposition:   FU with Yaniah Thiemann pending LINQ resulst  Signed, Amauri Keefe Meredith Leeds, MD  04/27/2017 3:00 PM     [address] [place] [address] [phone] (office) [phone] (fax)

## 2017-04-27 NOTE — Patient Instructions (Addendum)
Medication Instructions:  Your physician recommends that you continue on your current medications as directed. Please refer to the Current Medication list given to you today.  * If you need a refill on your cardiac medications before your next appointment, please call your pharmacy. *  Labwork: None ordered  Testing/Procedures: Your physician has recommended that you have a loop recorder implanted.  You are scheduled for 05/10/17 at [facility]  Please arrive at 9:00 a.m to the [facility], Entrance "A"  You may eat a normal breakfast that morning  Follow-Up: Office will call you to arrange follow up.  Thank you for choosing [facility]!!   Trinidad Curet, RN [phone]

## 2017-05-02 ENCOUNTER — Ambulatory Visit: Payer: Medicare Other | Admitting: Cardiology

## 2017-05-10 ENCOUNTER — Encounter (HOSPITAL_COMMUNITY): Payer: Self-pay | Admitting: Cardiology

## 2017-05-10 ENCOUNTER — Encounter (HOSPITAL_COMMUNITY)
Admission: RE | Disposition: A | Discharge status: Home / Self Care | Payer: Self-pay | Source: Ambulatory Visit | Attending: Cardiology

## 2017-05-10 ENCOUNTER — Ambulatory Visit (HOSPITAL_COMMUNITY)
Admission: RE | Admit: 2017-05-10 | Discharge: 2017-05-10 | Disposition: A | Discharge status: Home / Self Care | Payer: Medicare Other | Source: Ambulatory Visit | Attending: Cardiology | Admitting: Cardiology

## 2017-05-10 DIAGNOSIS — Z880 Allergy status to penicillin: Secondary | ICD-10-CM | POA: Diagnosis not present

## 2017-05-10 DIAGNOSIS — Z7982 Long term (current) use of aspirin: Secondary | ICD-10-CM | POA: Insufficient documentation

## 2017-05-10 DIAGNOSIS — Z79899 Other long term (current) drug therapy: Secondary | ICD-10-CM | POA: Diagnosis not present

## 2017-05-10 DIAGNOSIS — N183 Chronic kidney disease, stage 3 (moderate): Secondary | ICD-10-CM | POA: Diagnosis not present

## 2017-05-10 DIAGNOSIS — Z8673 Personal history of transient ischemic attack (TIA), and cerebral infarction without residual deficits: Secondary | ICD-10-CM | POA: Diagnosis not present

## 2017-05-10 DIAGNOSIS — G459 Transient cerebral ischemic attack, unspecified: Secondary | ICD-10-CM | POA: Insufficient documentation

## 2017-05-10 DIAGNOSIS — G629 Polyneuropathy, unspecified: Secondary | ICD-10-CM | POA: Diagnosis not present

## 2017-05-10 DIAGNOSIS — I129 Hypertensive chronic kidney disease with stage 1 through stage 4 chronic kidney disease, or unspecified chronic kidney disease: Secondary | ICD-10-CM | POA: Insufficient documentation

## 2017-05-10 DIAGNOSIS — F0390 Unspecified dementia without behavioral disturbance: Secondary | ICD-10-CM | POA: Insufficient documentation

## 2017-05-10 HISTORY — PX: LOOP RECORDER INSERTION: EP1214

## 2017-05-10 SURGERY — LOOP RECORDER INSERTION

## 2017-05-10 MED ORDER — LIDOCAINE-EPINEPHRINE 1 %-1:100000 IJ SOLN
INTRAMUSCULAR | Status: AC
  Filled 2017-05-10: qty 1

## 2017-05-10 MED ORDER — LIDOCAINE-EPINEPHRINE 1 %-1:100000 IJ SOLN
INTRAMUSCULAR | Status: DC | PRN
  Administered 2017-05-10: 30 mL

## 2017-05-10 SURGICAL SUPPLY — 2 items
LOOP REVEAL LINQSYS (Prosthesis & Implant Heart) ×3 IMPLANT
PACK LOOP INSERTION (CUSTOM PROCEDURE TRAY) ×3 IMPLANT

## 2017-05-10 NOTE — Discharge Instructions (Signed)
Keep incision clean and dry for 13 days.  You can remove outer dressing tomorrow. Leave steri-strips (little pieces of tape) on until seen in the office for wound check appointment. Call the office [phone]) for redness, drainage, swelling, or fever.

## 2017-05-10 NOTE — H&P (Signed)
Thomas Hooper. is a 83 y.o. male with a history of TIA, dementia, HTN, CKD III. He presents today after workup of his TIA without cause. Presenting for LINQ implant. On exam, RRR, no murmurs, lungs clear. Risks and benefits discussed. Risks include but not limited to bleeding, infection. The patient and family understand the risks and have agreed to the procedure.  Joda Braatz Curt Bears, MD 05/10/2017 9:55 AM

## 2017-05-19 ENCOUNTER — Ambulatory Visit: Payer: Medicare Other

## 2017-05-19 ENCOUNTER — Telehealth: Payer: Self-pay

## 2017-05-19 DIAGNOSIS — C44222 Squamous cell carcinoma of skin of right ear and external auricular canal: Secondary | ICD-10-CM | POA: Diagnosis not present

## 2017-05-19 NOTE — Telephone Encounter (Signed)
LVM on on listed home phone to call back to reschedule missed wound LINQ apt. Also attempted Ashely Garren listed as emergency contact, VM box was full.

## 2017-06-09 ENCOUNTER — Ambulatory Visit (INDEPENDENT_AMBULATORY_CARE_PROVIDER_SITE_OTHER): Payer: Medicare Other | Admitting: *Deleted

## 2017-06-09 DIAGNOSIS — G459 Transient cerebral ischemic attack, unspecified: Secondary | ICD-10-CM | POA: Diagnosis not present

## 2017-06-09 NOTE — Progress Notes (Signed)
Carelink Summary Report / Loop Recorder 

## 2017-06-21 DIAGNOSIS — F0390 Unspecified dementia without behavioral disturbance: Secondary | ICD-10-CM | POA: Diagnosis not present

## 2017-06-21 DIAGNOSIS — R51 Headache: Secondary | ICD-10-CM | POA: Diagnosis not present

## 2017-06-21 DIAGNOSIS — R531 Weakness: Secondary | ICD-10-CM | POA: Diagnosis not present

## 2017-06-21 DIAGNOSIS — R410 Disorientation, unspecified: Secondary | ICD-10-CM | POA: Diagnosis not present

## 2017-06-22 ENCOUNTER — Encounter: Payer: Self-pay | Admitting: Cardiology

## 2017-06-22 LAB — CUP PACEART REMOTE DEVICE CHECK
Date Time Interrogation Session: 20190103144042
Implantable Pulse Generator Implant Date: 20181205

## 2017-06-29 DIAGNOSIS — C44622 Squamous cell carcinoma of skin of right upper limb, including shoulder: Secondary | ICD-10-CM | POA: Diagnosis not present

## 2017-06-29 DIAGNOSIS — H61001 Unspecified perichondritis of right external ear: Secondary | ICD-10-CM | POA: Diagnosis not present

## 2017-07-10 DIAGNOSIS — I129 Hypertensive chronic kidney disease with stage 1 through stage 4 chronic kidney disease, or unspecified chronic kidney disease: Secondary | ICD-10-CM | POA: Diagnosis not present

## 2017-07-10 DIAGNOSIS — F0390 Unspecified dementia without behavioral disturbance: Secondary | ICD-10-CM | POA: Diagnosis not present

## 2017-07-10 DIAGNOSIS — I4891 Unspecified atrial fibrillation: Secondary | ICD-10-CM | POA: Diagnosis not present

## 2017-07-10 DIAGNOSIS — N183 Chronic kidney disease, stage 3 (moderate): Secondary | ICD-10-CM | POA: Diagnosis not present

## 2017-07-11 ENCOUNTER — Ambulatory Visit (INDEPENDENT_AMBULATORY_CARE_PROVIDER_SITE_OTHER): Payer: Medicare Other | Admitting: *Deleted

## 2017-07-11 DIAGNOSIS — G459 Transient cerebral ischemic attack, unspecified: Secondary | ICD-10-CM | POA: Diagnosis not present

## 2017-07-11 NOTE — Progress Notes (Signed)
Carelink Summary Reprot / Loop Recorder 

## 2017-07-12 DIAGNOSIS — F0390 Unspecified dementia without behavioral disturbance: Secondary | ICD-10-CM | POA: Diagnosis not present

## 2017-07-12 DIAGNOSIS — I129 Hypertensive chronic kidney disease with stage 1 through stage 4 chronic kidney disease, or unspecified chronic kidney disease: Secondary | ICD-10-CM | POA: Diagnosis not present

## 2017-07-14 DIAGNOSIS — F0390 Unspecified dementia without behavioral disturbance: Secondary | ICD-10-CM | POA: Diagnosis not present

## 2017-07-14 DIAGNOSIS — I129 Hypertensive chronic kidney disease with stage 1 through stage 4 chronic kidney disease, or unspecified chronic kidney disease: Secondary | ICD-10-CM | POA: Diagnosis not present

## 2017-07-19 DIAGNOSIS — I129 Hypertensive chronic kidney disease with stage 1 through stage 4 chronic kidney disease, or unspecified chronic kidney disease: Secondary | ICD-10-CM | POA: Diagnosis not present

## 2017-07-19 DIAGNOSIS — F0390 Unspecified dementia without behavioral disturbance: Secondary | ICD-10-CM | POA: Diagnosis not present

## 2017-07-19 LAB — CUP PACEART REMOTE DEVICE CHECK
Date Time Interrogation Session: 20190202150715
Implantable Pulse Generator Implant Date: 20181205

## 2017-07-20 DIAGNOSIS — F0390 Unspecified dementia without behavioral disturbance: Secondary | ICD-10-CM | POA: Diagnosis not present

## 2017-07-20 DIAGNOSIS — I129 Hypertensive chronic kidney disease with stage 1 through stage 4 chronic kidney disease, or unspecified chronic kidney disease: Secondary | ICD-10-CM | POA: Diagnosis not present

## 2017-07-22 DIAGNOSIS — F0390 Unspecified dementia without behavioral disturbance: Secondary | ICD-10-CM | POA: Diagnosis not present

## 2017-07-22 DIAGNOSIS — I129 Hypertensive chronic kidney disease with stage 1 through stage 4 chronic kidney disease, or unspecified chronic kidney disease: Secondary | ICD-10-CM | POA: Diagnosis not present

## 2017-07-26 DIAGNOSIS — F0390 Unspecified dementia without behavioral disturbance: Secondary | ICD-10-CM | POA: Diagnosis not present

## 2017-07-26 DIAGNOSIS — I129 Hypertensive chronic kidney disease with stage 1 through stage 4 chronic kidney disease, or unspecified chronic kidney disease: Secondary | ICD-10-CM | POA: Diagnosis not present

## 2017-07-28 DIAGNOSIS — E538 Deficiency of other specified B group vitamins: Secondary | ICD-10-CM | POA: Diagnosis not present

## 2017-07-28 DIAGNOSIS — K219 Gastro-esophageal reflux disease without esophagitis: Secondary | ICD-10-CM | POA: Diagnosis not present

## 2017-07-28 DIAGNOSIS — I10 Essential (primary) hypertension: Secondary | ICD-10-CM | POA: Diagnosis not present

## 2017-07-29 DIAGNOSIS — F0390 Unspecified dementia without behavioral disturbance: Secondary | ICD-10-CM | POA: Diagnosis not present

## 2017-07-29 DIAGNOSIS — I129 Hypertensive chronic kidney disease with stage 1 through stage 4 chronic kidney disease, or unspecified chronic kidney disease: Secondary | ICD-10-CM | POA: Diagnosis not present

## 2017-08-03 DIAGNOSIS — F0390 Unspecified dementia without behavioral disturbance: Secondary | ICD-10-CM | POA: Diagnosis not present

## 2017-08-03 DIAGNOSIS — I129 Hypertensive chronic kidney disease with stage 1 through stage 4 chronic kidney disease, or unspecified chronic kidney disease: Secondary | ICD-10-CM | POA: Diagnosis not present

## 2017-08-10 DIAGNOSIS — F0390 Unspecified dementia without behavioral disturbance: Secondary | ICD-10-CM | POA: Diagnosis not present

## 2017-08-10 DIAGNOSIS — I129 Hypertensive chronic kidney disease with stage 1 through stage 4 chronic kidney disease, or unspecified chronic kidney disease: Secondary | ICD-10-CM | POA: Diagnosis not present

## 2017-08-11 ENCOUNTER — Ambulatory Visit (INDEPENDENT_AMBULATORY_CARE_PROVIDER_SITE_OTHER): Payer: Medicare Other | Admitting: *Deleted

## 2017-08-11 DIAGNOSIS — R1013 Epigastric pain: Secondary | ICD-10-CM | POA: Diagnosis not present

## 2017-08-11 DIAGNOSIS — N200 Calculus of kidney: Secondary | ICD-10-CM | POA: Diagnosis not present

## 2017-08-11 DIAGNOSIS — K5900 Constipation, unspecified: Secondary | ICD-10-CM | POA: Diagnosis not present

## 2017-08-11 DIAGNOSIS — G459 Transient cerebral ischemic attack, unspecified: Secondary | ICD-10-CM | POA: Diagnosis not present

## 2017-08-11 DIAGNOSIS — F0390 Unspecified dementia without behavioral disturbance: Secondary | ICD-10-CM | POA: Diagnosis not present

## 2017-08-12 NOTE — Progress Notes (Signed)
Carelink Summary Report / Loop Recorder 

## 2017-08-17 DIAGNOSIS — I129 Hypertensive chronic kidney disease with stage 1 through stage 4 chronic kidney disease, or unspecified chronic kidney disease: Secondary | ICD-10-CM | POA: Diagnosis not present

## 2017-08-17 DIAGNOSIS — F0390 Unspecified dementia without behavioral disturbance: Secondary | ICD-10-CM | POA: Diagnosis not present

## 2017-08-22 DIAGNOSIS — F0390 Unspecified dementia without behavioral disturbance: Secondary | ICD-10-CM | POA: Diagnosis not present

## 2017-08-22 DIAGNOSIS — I129 Hypertensive chronic kidney disease with stage 1 through stage 4 chronic kidney disease, or unspecified chronic kidney disease: Secondary | ICD-10-CM | POA: Diagnosis not present

## 2017-09-13 ENCOUNTER — Ambulatory Visit (INDEPENDENT_AMBULATORY_CARE_PROVIDER_SITE_OTHER): Payer: Medicare Other | Admitting: *Deleted

## 2017-09-13 DIAGNOSIS — G459 Transient cerebral ischemic attack, unspecified: Secondary | ICD-10-CM | POA: Diagnosis not present

## 2017-09-13 NOTE — Progress Notes (Signed)
Carelink Summary Report / Loop Recroder

## 2017-09-22 LAB — CUP PACEART REMOTE DEVICE CHECK
Date Time Interrogation Session: 20190307164320
Implantable Pulse Generator Implant Date: 20181205

## 2017-10-06 DIAGNOSIS — L570 Actinic keratosis: Secondary | ICD-10-CM | POA: Diagnosis not present

## 2017-10-06 DIAGNOSIS — I10 Essential (primary) hypertension: Secondary | ICD-10-CM | POA: Diagnosis not present

## 2017-10-06 DIAGNOSIS — F0390 Unspecified dementia without behavioral disturbance: Secondary | ICD-10-CM | POA: Diagnosis not present

## 2017-10-06 DIAGNOSIS — M109 Gout, unspecified: Secondary | ICD-10-CM | POA: Diagnosis not present

## 2017-10-06 DIAGNOSIS — I4891 Unspecified atrial fibrillation: Secondary | ICD-10-CM | POA: Diagnosis not present

## 2017-10-06 DIAGNOSIS — E785 Hyperlipidemia, unspecified: Secondary | ICD-10-CM | POA: Diagnosis not present

## 2017-10-06 DIAGNOSIS — E039 Hypothyroidism, unspecified: Secondary | ICD-10-CM | POA: Diagnosis not present

## 2017-10-06 DIAGNOSIS — C44629 Squamous cell carcinoma of skin of left upper limb, including shoulder: Secondary | ICD-10-CM | POA: Diagnosis not present

## 2017-10-06 DIAGNOSIS — E538 Deficiency of other specified B group vitamins: Secondary | ICD-10-CM | POA: Diagnosis not present

## 2017-10-06 DIAGNOSIS — K219 Gastro-esophageal reflux disease without esophagitis: Secondary | ICD-10-CM | POA: Diagnosis not present

## 2017-10-06 DIAGNOSIS — L814 Other melanin hyperpigmentation: Secondary | ICD-10-CM | POA: Diagnosis not present

## 2017-10-06 DIAGNOSIS — Z6825 Body mass index (BMI) 25.0-25.9, adult: Secondary | ICD-10-CM | POA: Diagnosis not present

## 2017-10-06 DIAGNOSIS — L821 Other seborrheic keratosis: Secondary | ICD-10-CM | POA: Diagnosis not present

## 2017-10-06 DIAGNOSIS — F329 Major depressive disorder, single episode, unspecified: Secondary | ICD-10-CM | POA: Diagnosis not present

## 2017-10-14 DIAGNOSIS — M542 Cervicalgia: Secondary | ICD-10-CM | POA: Insufficient documentation

## 2017-10-14 DIAGNOSIS — M5030 Other cervical disc degeneration, unspecified cervical region: Secondary | ICD-10-CM | POA: Diagnosis not present

## 2017-10-14 DIAGNOSIS — M47812 Spondylosis without myelopathy or radiculopathy, cervical region: Secondary | ICD-10-CM | POA: Diagnosis not present

## 2017-10-14 DIAGNOSIS — G609 Hereditary and idiopathic neuropathy, unspecified: Secondary | ICD-10-CM | POA: Diagnosis not present

## 2017-10-14 DIAGNOSIS — G629 Polyneuropathy, unspecified: Secondary | ICD-10-CM | POA: Insufficient documentation

## 2017-10-17 ENCOUNTER — Ambulatory Visit (INDEPENDENT_AMBULATORY_CARE_PROVIDER_SITE_OTHER): Payer: Medicare Other | Admitting: *Deleted

## 2017-10-17 DIAGNOSIS — I429 Cardiomyopathy, unspecified: Secondary | ICD-10-CM | POA: Diagnosis not present

## 2017-10-17 DIAGNOSIS — N183 Chronic kidney disease, stage 3 (moderate): Secondary | ICD-10-CM | POA: Diagnosis not present

## 2017-10-17 DIAGNOSIS — I129 Hypertensive chronic kidney disease with stage 1 through stage 4 chronic kidney disease, or unspecified chronic kidney disease: Secondary | ICD-10-CM | POA: Diagnosis not present

## 2017-10-17 DIAGNOSIS — M47812 Spondylosis without myelopathy or radiculopathy, cervical region: Secondary | ICD-10-CM | POA: Diagnosis not present

## 2017-10-17 DIAGNOSIS — I4891 Unspecified atrial fibrillation: Secondary | ICD-10-CM | POA: Diagnosis not present

## 2017-10-17 LAB — CUP PACEART REMOTE DEVICE CHECK
Date Time Interrogation Session: 20190409173928
MDC IDC PG IMPLANT DT: 20181205

## 2017-10-17 NOTE — Progress Notes (Signed)
Carelink Summary Report / Loop Recorder 

## 2017-10-21 DIAGNOSIS — I129 Hypertensive chronic kidney disease with stage 1 through stage 4 chronic kidney disease, or unspecified chronic kidney disease: Secondary | ICD-10-CM | POA: Diagnosis not present

## 2017-10-21 DIAGNOSIS — M47812 Spondylosis without myelopathy or radiculopathy, cervical region: Secondary | ICD-10-CM | POA: Diagnosis not present

## 2017-10-24 DIAGNOSIS — K449 Diaphragmatic hernia without obstruction or gangrene: Secondary | ICD-10-CM | POA: Diagnosis not present

## 2017-10-24 DIAGNOSIS — R1013 Epigastric pain: Secondary | ICD-10-CM | POA: Diagnosis not present

## 2017-10-24 DIAGNOSIS — K5710 Diverticulosis of small intestine without perforation or abscess without bleeding: Secondary | ICD-10-CM | POA: Diagnosis not present

## 2017-10-24 DIAGNOSIS — I129 Hypertensive chronic kidney disease with stage 1 through stage 4 chronic kidney disease, or unspecified chronic kidney disease: Secondary | ICD-10-CM | POA: Diagnosis not present

## 2017-10-24 DIAGNOSIS — M47812 Spondylosis without myelopathy or radiculopathy, cervical region: Secondary | ICD-10-CM | POA: Diagnosis not present

## 2017-10-24 DIAGNOSIS — K5900 Constipation, unspecified: Secondary | ICD-10-CM | POA: Diagnosis not present

## 2017-10-24 DIAGNOSIS — N189 Chronic kidney disease, unspecified: Secondary | ICD-10-CM | POA: Diagnosis not present

## 2017-10-25 DIAGNOSIS — C44629 Squamous cell carcinoma of skin of left upper limb, including shoulder: Secondary | ICD-10-CM | POA: Diagnosis not present

## 2017-10-28 DIAGNOSIS — I129 Hypertensive chronic kidney disease with stage 1 through stage 4 chronic kidney disease, or unspecified chronic kidney disease: Secondary | ICD-10-CM | POA: Diagnosis not present

## 2017-10-28 DIAGNOSIS — M47812 Spondylosis without myelopathy or radiculopathy, cervical region: Secondary | ICD-10-CM | POA: Diagnosis not present

## 2017-10-31 DIAGNOSIS — I129 Hypertensive chronic kidney disease with stage 1 through stage 4 chronic kidney disease, or unspecified chronic kidney disease: Secondary | ICD-10-CM | POA: Diagnosis not present

## 2017-10-31 DIAGNOSIS — M47812 Spondylosis without myelopathy or radiculopathy, cervical region: Secondary | ICD-10-CM | POA: Diagnosis not present

## 2017-11-03 DIAGNOSIS — I129 Hypertensive chronic kidney disease with stage 1 through stage 4 chronic kidney disease, or unspecified chronic kidney disease: Secondary | ICD-10-CM | POA: Diagnosis not present

## 2017-11-03 DIAGNOSIS — M47812 Spondylosis without myelopathy or radiculopathy, cervical region: Secondary | ICD-10-CM | POA: Diagnosis not present

## 2017-11-08 DIAGNOSIS — M47812 Spondylosis without myelopathy or radiculopathy, cervical region: Secondary | ICD-10-CM | POA: Diagnosis not present

## 2017-11-08 DIAGNOSIS — I129 Hypertensive chronic kidney disease with stage 1 through stage 4 chronic kidney disease, or unspecified chronic kidney disease: Secondary | ICD-10-CM | POA: Diagnosis not present

## 2017-11-11 LAB — CUP PACEART REMOTE DEVICE CHECK
MDC IDC PG IMPLANT DT: 20181205
MDC IDC SESS DTM: 20190512180644

## 2017-11-15 DIAGNOSIS — M47812 Spondylosis without myelopathy or radiculopathy, cervical region: Secondary | ICD-10-CM | POA: Diagnosis not present

## 2017-11-15 DIAGNOSIS — I129 Hypertensive chronic kidney disease with stage 1 through stage 4 chronic kidney disease, or unspecified chronic kidney disease: Secondary | ICD-10-CM | POA: Diagnosis not present

## 2017-11-18 ENCOUNTER — Ambulatory Visit (INDEPENDENT_AMBULATORY_CARE_PROVIDER_SITE_OTHER): Payer: Medicare Other | Admitting: *Deleted

## 2017-11-18 DIAGNOSIS — G459 Transient cerebral ischemic attack, unspecified: Secondary | ICD-10-CM

## 2017-11-21 DIAGNOSIS — R531 Weakness: Secondary | ICD-10-CM | POA: Diagnosis not present

## 2017-11-21 DIAGNOSIS — Z862 Personal history of diseases of the blood and blood-forming organs and certain disorders involving the immune mechanism: Secondary | ICD-10-CM | POA: Diagnosis not present

## 2017-11-21 DIAGNOSIS — K449 Diaphragmatic hernia without obstruction or gangrene: Secondary | ICD-10-CM | POA: Diagnosis not present

## 2017-11-21 DIAGNOSIS — Z8673 Personal history of transient ischemic attack (TIA), and cerebral infarction without residual deficits: Secondary | ICD-10-CM | POA: Diagnosis not present

## 2017-11-21 NOTE — Progress Notes (Signed)
Carelink Summary Report / Loop Recorder 

## 2017-11-25 DIAGNOSIS — R51 Headache: Secondary | ICD-10-CM | POA: Diagnosis not present

## 2017-11-25 DIAGNOSIS — Z8673 Personal history of transient ischemic attack (TIA), and cerebral infarction without residual deficits: Secondary | ICD-10-CM | POA: Diagnosis not present

## 2017-11-25 DIAGNOSIS — R531 Weakness: Secondary | ICD-10-CM | POA: Diagnosis not present

## 2017-12-14 DIAGNOSIS — M7918 Myalgia, other site: Secondary | ICD-10-CM | POA: Diagnosis not present

## 2017-12-14 DIAGNOSIS — G609 Hereditary and idiopathic neuropathy, unspecified: Secondary | ICD-10-CM | POA: Diagnosis not present

## 2017-12-14 DIAGNOSIS — M542 Cervicalgia: Secondary | ICD-10-CM | POA: Diagnosis not present

## 2017-12-14 DIAGNOSIS — M47812 Spondylosis without myelopathy or radiculopathy, cervical region: Secondary | ICD-10-CM | POA: Diagnosis not present

## 2017-12-14 DIAGNOSIS — M5030 Other cervical disc degeneration, unspecified cervical region: Secondary | ICD-10-CM | POA: Diagnosis not present

## 2017-12-21 ENCOUNTER — Ambulatory Visit (INDEPENDENT_AMBULATORY_CARE_PROVIDER_SITE_OTHER): Payer: Medicare Other | Admitting: *Deleted

## 2017-12-21 DIAGNOSIS — I10 Essential (primary) hypertension: Secondary | ICD-10-CM | POA: Diagnosis not present

## 2017-12-21 DIAGNOSIS — G459 Transient cerebral ischemic attack, unspecified: Secondary | ICD-10-CM | POA: Diagnosis not present

## 2017-12-21 DIAGNOSIS — Z471 Aftercare following joint replacement surgery: Secondary | ICD-10-CM | POA: Diagnosis not present

## 2017-12-21 DIAGNOSIS — T84021A Dislocation of internal left hip prosthesis, initial encounter: Secondary | ICD-10-CM | POA: Diagnosis not present

## 2017-12-21 DIAGNOSIS — Z96642 Presence of left artificial hip joint: Secondary | ICD-10-CM | POA: Diagnosis not present

## 2017-12-21 DIAGNOSIS — S299XXA Unspecified injury of thorax, initial encounter: Secondary | ICD-10-CM | POA: Diagnosis not present

## 2017-12-21 DIAGNOSIS — S73005A Unspecified dislocation of left hip, initial encounter: Secondary | ICD-10-CM | POA: Diagnosis not present

## 2017-12-21 DIAGNOSIS — W19XXXA Unspecified fall, initial encounter: Secondary | ICD-10-CM | POA: Diagnosis not present

## 2017-12-21 DIAGNOSIS — I440 Atrioventricular block, first degree: Secondary | ICD-10-CM | POA: Diagnosis not present

## 2017-12-22 DIAGNOSIS — Z96642 Presence of left artificial hip joint: Secondary | ICD-10-CM | POA: Diagnosis not present

## 2017-12-22 DIAGNOSIS — Z538 Procedure and treatment not carried out for other reasons: Secondary | ICD-10-CM | POA: Diagnosis not present

## 2017-12-22 DIAGNOSIS — T84021A Dislocation of internal left hip prosthesis, initial encounter: Secondary | ICD-10-CM | POA: Diagnosis not present

## 2017-12-22 DIAGNOSIS — Z471 Aftercare following joint replacement surgery: Secondary | ICD-10-CM | POA: Diagnosis not present

## 2017-12-22 NOTE — Progress Notes (Signed)
Carelink Summary Report / Loop Recorder 

## 2017-12-23 ENCOUNTER — Observation Stay (HOSPITAL_COMMUNITY): Payer: Medicare Other

## 2017-12-23 ENCOUNTER — Other Ambulatory Visit: Payer: Self-pay | Admitting: Cardiology

## 2017-12-23 ENCOUNTER — Observation Stay (HOSPITAL_COMMUNITY): Payer: Medicare Other | Admitting: Certified Registered"

## 2017-12-23 ENCOUNTER — Encounter (HOSPITAL_COMMUNITY): Payer: Self-pay | Admitting: Certified Registered Nurse Anesthetist

## 2017-12-23 ENCOUNTER — Inpatient Hospital Stay (HOSPITAL_COMMUNITY)
Admission: AD | Admit: 2017-12-23 | Discharge: 2017-12-30 | DRG: 559 | Disposition: A | Discharge status: Skilled Nursing Facility | Payer: Medicare Other | Source: Other Acute Inpatient Hospital | Attending: Internal Medicine | Admitting: Internal Medicine

## 2017-12-23 ENCOUNTER — Encounter (HOSPITAL_COMMUNITY)
Admission: AD | Disposition: A | Discharge status: Skilled Nursing Facility | Payer: Self-pay | Source: Other Acute Inpatient Hospital | Attending: Internal Medicine

## 2017-12-23 DIAGNOSIS — Z880 Allergy status to penicillin: Secondary | ICD-10-CM

## 2017-12-23 DIAGNOSIS — W19XXXA Unspecified fall, initial encounter: Secondary | ICD-10-CM | POA: Diagnosis present

## 2017-12-23 DIAGNOSIS — I130 Hypertensive heart and chronic kidney disease with heart failure and stage 1 through stage 4 chronic kidney disease, or unspecified chronic kidney disease: Secondary | ICD-10-CM | POA: Diagnosis present

## 2017-12-23 DIAGNOSIS — R9401 Abnormal electroencephalogram [EEG]: Secondary | ICD-10-CM | POA: Diagnosis present

## 2017-12-23 DIAGNOSIS — R279 Unspecified lack of coordination: Secondary | ICD-10-CM | POA: Diagnosis not present

## 2017-12-23 DIAGNOSIS — Z419 Encounter for procedure for purposes other than remedying health state, unspecified: Secondary | ICD-10-CM

## 2017-12-23 DIAGNOSIS — G9341 Metabolic encephalopathy: Secondary | ICD-10-CM | POA: Diagnosis present

## 2017-12-23 DIAGNOSIS — N183 Chronic kidney disease, stage 3 unspecified: Secondary | ICD-10-CM | POA: Diagnosis present

## 2017-12-23 DIAGNOSIS — Z79890 Hormone replacement therapy (postmenopausal): Secondary | ICD-10-CM

## 2017-12-23 DIAGNOSIS — S2231XA Fracture of one rib, right side, initial encounter for closed fracture: Secondary | ICD-10-CM | POA: Diagnosis not present

## 2017-12-23 DIAGNOSIS — M25552 Pain in left hip: Secondary | ICD-10-CM | POA: Diagnosis not present

## 2017-12-23 DIAGNOSIS — T797XXA Traumatic subcutaneous emphysema, initial encounter: Secondary | ICD-10-CM | POA: Diagnosis not present

## 2017-12-23 DIAGNOSIS — I481 Persistent atrial fibrillation: Secondary | ICD-10-CM | POA: Diagnosis present

## 2017-12-23 DIAGNOSIS — Z79899 Other long term (current) drug therapy: Secondary | ICD-10-CM

## 2017-12-23 DIAGNOSIS — Z885 Allergy status to narcotic agent status: Secondary | ICD-10-CM | POA: Diagnosis not present

## 2017-12-23 DIAGNOSIS — T84021A Dislocation of internal left hip prosthesis, initial encounter: Principal | ICD-10-CM | POA: Diagnosis present

## 2017-12-23 DIAGNOSIS — K219 Gastro-esophageal reflux disease without esophagitis: Secondary | ICD-10-CM | POA: Diagnosis present

## 2017-12-23 DIAGNOSIS — S2249XD Multiple fractures of ribs, unspecified side, subsequent encounter for fracture with routine healing: Secondary | ICD-10-CM | POA: Diagnosis not present

## 2017-12-23 DIAGNOSIS — Z96643 Presence of artificial hip joint, bilateral: Secondary | ICD-10-CM | POA: Diagnosis present

## 2017-12-23 DIAGNOSIS — Z471 Aftercare following joint replacement surgery: Secondary | ICD-10-CM | POA: Diagnosis not present

## 2017-12-23 DIAGNOSIS — Z743 Need for continuous supervision: Secondary | ICD-10-CM | POA: Diagnosis not present

## 2017-12-23 DIAGNOSIS — R4702 Dysphasia: Secondary | ICD-10-CM | POA: Diagnosis present

## 2017-12-23 DIAGNOSIS — M109 Gout, unspecified: Secondary | ICD-10-CM | POA: Diagnosis present

## 2017-12-23 DIAGNOSIS — E876 Hypokalemia: Secondary | ICD-10-CM | POA: Diagnosis not present

## 2017-12-23 DIAGNOSIS — I480 Paroxysmal atrial fibrillation: Secondary | ICD-10-CM | POA: Diagnosis present

## 2017-12-23 DIAGNOSIS — Y792 Prosthetic and other implants, materials and accessory orthopedic devices associated with adverse incidents: Secondary | ICD-10-CM | POA: Diagnosis present

## 2017-12-23 DIAGNOSIS — Z8673 Personal history of transient ischemic attack (TIA), and cerebral infarction without residual deficits: Secondary | ICD-10-CM

## 2017-12-23 DIAGNOSIS — Z7982 Long term (current) use of aspirin: Secondary | ICD-10-CM

## 2017-12-23 DIAGNOSIS — Z9889 Other specified postprocedural states: Secondary | ICD-10-CM | POA: Diagnosis not present

## 2017-12-23 DIAGNOSIS — Y92009 Unspecified place in unspecified non-institutional (private) residence as the place of occurrence of the external cause: Secondary | ICD-10-CM | POA: Diagnosis not present

## 2017-12-23 DIAGNOSIS — I5022 Chronic systolic (congestive) heart failure: Secondary | ICD-10-CM | POA: Diagnosis present

## 2017-12-23 DIAGNOSIS — E872 Acidosis: Secondary | ICD-10-CM | POA: Diagnosis not present

## 2017-12-23 DIAGNOSIS — F0390 Unspecified dementia without behavioral disturbance: Secondary | ICD-10-CM | POA: Diagnosis present

## 2017-12-23 DIAGNOSIS — G4089 Other seizures: Secondary | ICD-10-CM | POA: Diagnosis not present

## 2017-12-23 DIAGNOSIS — S73005D Unspecified dislocation of left hip, subsequent encounter: Secondary | ICD-10-CM

## 2017-12-23 DIAGNOSIS — Z96642 Presence of left artificial hip joint: Secondary | ICD-10-CM | POA: Diagnosis not present

## 2017-12-23 DIAGNOSIS — F329 Major depressive disorder, single episode, unspecified: Secondary | ICD-10-CM | POA: Diagnosis present

## 2017-12-23 DIAGNOSIS — Z96641 Presence of right artificial hip joint: Secondary | ICD-10-CM | POA: Diagnosis not present

## 2017-12-23 DIAGNOSIS — J9811 Atelectasis: Secondary | ICD-10-CM | POA: Diagnosis not present

## 2017-12-23 DIAGNOSIS — Z8249 Family history of ischemic heart disease and other diseases of the circulatory system: Secondary | ICD-10-CM

## 2017-12-23 DIAGNOSIS — Z4789 Encounter for other orthopedic aftercare: Secondary | ICD-10-CM | POA: Diagnosis not present

## 2017-12-23 DIAGNOSIS — S301XXA Contusion of abdominal wall, initial encounter: Secondary | ICD-10-CM | POA: Diagnosis not present

## 2017-12-23 DIAGNOSIS — R29818 Other symptoms and signs involving the nervous system: Secondary | ICD-10-CM | POA: Diagnosis not present

## 2017-12-23 DIAGNOSIS — R569 Unspecified convulsions: Secondary | ICD-10-CM | POA: Diagnosis not present

## 2017-12-23 DIAGNOSIS — K449 Diaphragmatic hernia without obstruction or gangrene: Secondary | ICD-10-CM | POA: Diagnosis not present

## 2017-12-23 DIAGNOSIS — Z791 Long term (current) use of non-steroidal anti-inflammatories (NSAID): Secondary | ICD-10-CM

## 2017-12-23 DIAGNOSIS — M6281 Muscle weakness (generalized): Secondary | ICD-10-CM | POA: Diagnosis not present

## 2017-12-23 DIAGNOSIS — Y848 Other medical procedures as the cause of abnormal reaction of the patient, or of later complication, without mention of misadventure at the time of the procedure: Secondary | ICD-10-CM | POA: Diagnosis present

## 2017-12-23 DIAGNOSIS — E039 Hypothyroidism, unspecified: Secondary | ICD-10-CM | POA: Diagnosis present

## 2017-12-23 DIAGNOSIS — G8384 Todd's paralysis (postepileptic): Secondary | ICD-10-CM | POA: Diagnosis not present

## 2017-12-23 DIAGNOSIS — H518 Other specified disorders of binocular movement: Secondary | ICD-10-CM | POA: Diagnosis not present

## 2017-12-23 DIAGNOSIS — E1140 Type 2 diabetes mellitus with diabetic neuropathy, unspecified: Secondary | ICD-10-CM | POA: Diagnosis present

## 2017-12-23 DIAGNOSIS — G40409 Other generalized epilepsy and epileptic syndromes, not intractable, without status epilepticus: Secondary | ICD-10-CM | POA: Diagnosis not present

## 2017-12-23 DIAGNOSIS — Z9181 History of falling: Secondary | ICD-10-CM

## 2017-12-23 DIAGNOSIS — Z96653 Presence of artificial knee joint, bilateral: Secondary | ICD-10-CM | POA: Diagnosis present

## 2017-12-23 DIAGNOSIS — Z888 Allergy status to other drugs, medicaments and biological substances status: Secondary | ICD-10-CM | POA: Diagnosis not present

## 2017-12-23 DIAGNOSIS — G629 Polyneuropathy, unspecified: Secondary | ICD-10-CM | POA: Diagnosis present

## 2017-12-23 DIAGNOSIS — I248 Other forms of acute ischemic heart disease: Secondary | ICD-10-CM | POA: Diagnosis present

## 2017-12-23 DIAGNOSIS — Z7409 Other reduced mobility: Secondary | ICD-10-CM | POA: Diagnosis not present

## 2017-12-23 DIAGNOSIS — R1312 Dysphagia, oropharyngeal phase: Secondary | ICD-10-CM | POA: Diagnosis not present

## 2017-12-23 DIAGNOSIS — R4182 Altered mental status, unspecified: Secondary | ICD-10-CM | POA: Diagnosis not present

## 2017-12-23 DIAGNOSIS — I429 Cardiomyopathy, unspecified: Secondary | ICD-10-CM | POA: Diagnosis present

## 2017-12-23 DIAGNOSIS — G459 Transient cerebral ischemic attack, unspecified: Secondary | ICD-10-CM | POA: Diagnosis not present

## 2017-12-23 DIAGNOSIS — Z9049 Acquired absence of other specified parts of digestive tract: Secondary | ICD-10-CM

## 2017-12-23 DIAGNOSIS — M2550 Pain in unspecified joint: Secondary | ICD-10-CM | POA: Diagnosis not present

## 2017-12-23 DIAGNOSIS — R0902 Hypoxemia: Secondary | ICD-10-CM | POA: Diagnosis not present

## 2017-12-23 DIAGNOSIS — Z7401 Bed confinement status: Secondary | ICD-10-CM | POA: Diagnosis not present

## 2017-12-23 DIAGNOSIS — R5383 Other fatigue: Secondary | ICD-10-CM | POA: Diagnosis not present

## 2017-12-23 DIAGNOSIS — R2689 Other abnormalities of gait and mobility: Secondary | ICD-10-CM | POA: Diagnosis not present

## 2017-12-23 DIAGNOSIS — S2239XA Fracture of one rib, unspecified side, initial encounter for closed fracture: Secondary | ICD-10-CM | POA: Diagnosis not present

## 2017-12-23 DIAGNOSIS — N184 Chronic kidney disease, stage 4 (severe): Secondary | ICD-10-CM | POA: Diagnosis present

## 2017-12-23 DIAGNOSIS — R1310 Dysphagia, unspecified: Secondary | ICD-10-CM | POA: Diagnosis present

## 2017-12-23 DIAGNOSIS — Z7189 Other specified counseling: Secondary | ICD-10-CM | POA: Diagnosis not present

## 2017-12-23 DIAGNOSIS — Z515 Encounter for palliative care: Secondary | ICD-10-CM | POA: Diagnosis not present

## 2017-12-23 DIAGNOSIS — S2249XA Multiple fractures of ribs, unspecified side, initial encounter for closed fracture: Secondary | ICD-10-CM | POA: Diagnosis not present

## 2017-12-23 DIAGNOSIS — S73005A Unspecified dislocation of left hip, initial encounter: Secondary | ICD-10-CM | POA: Diagnosis not present

## 2017-12-23 DIAGNOSIS — R278 Other lack of coordination: Secondary | ICD-10-CM | POA: Diagnosis not present

## 2017-12-23 DIAGNOSIS — I10 Essential (primary) hypertension: Secondary | ICD-10-CM | POA: Diagnosis not present

## 2017-12-23 HISTORY — PX: HIP CLOSED REDUCTION: SHX983

## 2017-12-23 LAB — COMPREHENSIVE METABOLIC PANEL
ALBUMIN: 3.1 g/dL — AB (ref 3.5–5.0)
ALK PHOS: 56 U/L (ref 38–126)
ALT: 14 U/L (ref 0–44)
ANION GAP: 8 (ref 5–15)
AST: 21 U/L (ref 15–41)
BUN: 24 mg/dL — ABNORMAL HIGH (ref 8–23)
CALCIUM: 8.6 mg/dL — AB (ref 8.9–10.3)
CO2: 22 mmol/L (ref 22–32)
Chloride: 110 mmol/L (ref 98–111)
Creatinine, Ser: 1.65 mg/dL — ABNORMAL HIGH (ref 0.61–1.24)
GFR calc Af Amer: 40 mL/min — ABNORMAL LOW (ref >60)
GFR calc non Af Amer: 35 mL/min — ABNORMAL LOW (ref >60)
GLUCOSE: 97 mg/dL (ref 70–99)
POTASSIUM: 4.5 mmol/L (ref 3.5–5.1)
SODIUM: 140 mmol/L (ref 135–145)
Total Bilirubin: 1.1 mg/dL (ref 0.3–1.2)
Total Protein: 5.6 g/dL — ABNORMAL LOW (ref 6.5–8.1)

## 2017-12-23 LAB — CUP PACEART REMOTE DEVICE CHECK
Date Time Interrogation Session: 20190614213946
Implantable Pulse Generator Implant Date: 20181205

## 2017-12-23 LAB — CBC
HCT: 34.8 % — ABNORMAL LOW (ref 39.0–52.0)
HEMOGLOBIN: 11.2 g/dL — AB (ref 13.0–17.0)
MCH: 34.5 pg — ABNORMAL HIGH (ref 26.0–34.0)
MCHC: 32.2 g/dL (ref 30.0–36.0)
MCV: 107.1 fL — ABNORMAL HIGH (ref 78.0–100.0)
Platelets: 85 10*3/uL — ABNORMAL LOW (ref 150–400)
RBC: 3.25 MIL/uL — ABNORMAL LOW (ref 4.22–5.81)
RDW: 14.7 % (ref 11.5–15.5)
WBC: 10.3 10*3/uL (ref 4.0–10.5)

## 2017-12-23 LAB — MRSA PCR SCREENING: MRSA by PCR: NEGATIVE

## 2017-12-23 SURGERY — CLOSED REDUCTION, HIP
Anesthesia: General | Laterality: Left

## 2017-12-23 MED ORDER — ONDANSETRON HCL 4 MG PO TABS: 4.0000 mg | ORAL_TABLET | Freq: Four times a day (QID) | ORAL | Status: DC | PRN

## 2017-12-23 MED ORDER — ENOXAPARIN SODIUM 30 MG/0.3ML ~~LOC~~ SOLN
30.0000 mg | SUBCUTANEOUS | Status: DC
  Administered 2017-12-25 – 2017-12-30 (×6): 30 mg via SUBCUTANEOUS
  Filled 2017-12-23 (×7): qty 0.3

## 2017-12-23 MED ORDER — METOPROLOL TARTRATE 12.5 MG HALF TABLET
12.5000 mg | ORAL_TABLET | Freq: Two times a day (BID) | ORAL | Status: DC
  Administered 2017-12-24 (×2): 12.5 mg via ORAL
  Filled 2017-12-23 (×3): qty 1

## 2017-12-23 MED ORDER — DOCUSATE SODIUM 100 MG PO CAPS
100.0000 mg | ORAL_CAPSULE | Freq: Two times a day (BID) | ORAL | Status: DC
  Administered 2017-12-24 – 2017-12-30 (×8): 100 mg via ORAL
  Filled 2017-12-23 (×10): qty 1

## 2017-12-23 MED ORDER — LIDOCAINE 2% (20 MG/ML) 5 ML SYRINGE
INTRAMUSCULAR | Status: AC
  Filled 2017-12-23: qty 5

## 2017-12-23 MED ORDER — ATORVASTATIN CALCIUM 10 MG PO TABS
10.0000 mg | ORAL_TABLET | Freq: Every day | ORAL | Status: DC
  Administered 2017-12-24 – 2017-12-29 (×3): 10 mg via ORAL
  Filled 2017-12-23 (×6): qty 1

## 2017-12-23 MED ORDER — ONDANSETRON HCL 4 MG/2ML IJ SOLN: 4.0000 mg | Freq: Four times a day (QID) | INTRAMUSCULAR | Status: DC | PRN

## 2017-12-23 MED ORDER — LEVOTHYROXINE SODIUM 25 MCG PO TABS
25.0000 ug | ORAL_TABLET | Freq: Every day | ORAL | Status: DC
  Administered 2017-12-24 – 2017-12-30 (×5): 25 ug via ORAL
  Filled 2017-12-23 (×6): qty 1

## 2017-12-23 MED ORDER — PROPOFOL 10 MG/ML IV BOLUS
INTRAVENOUS | Status: AC
  Filled 2017-12-23: qty 20

## 2017-12-23 MED ORDER — PROPOFOL 10 MG/ML IV BOLUS
INTRAVENOUS | Status: DC | PRN
  Administered 2017-12-23: 100 mg via INTRAVENOUS

## 2017-12-23 MED ORDER — CHLORHEXIDINE GLUCONATE 4 % EX LIQD: 60.0000 mL | Freq: Once | CUTANEOUS | Status: DC

## 2017-12-23 MED ORDER — HYDROMORPHONE HCL 1 MG/ML IJ SOLN
0.5000 mg | INTRAMUSCULAR | Status: DC | PRN
  Administered 2017-12-25: 0.5 mg via INTRAVENOUS
  Filled 2017-12-23 (×2): qty 0.5

## 2017-12-23 MED ORDER — METOCLOPRAMIDE HCL 5 MG/ML IJ SOLN: 5.0000 mg | Freq: Three times a day (TID) | INTRAMUSCULAR | Status: DC | PRN

## 2017-12-23 MED ORDER — POVIDONE-IODINE 10 % EX SWAB: 2.0000 "application " | Freq: Once | CUTANEOUS | Status: DC

## 2017-12-23 MED ORDER — LACTATED RINGERS IV SOLN
INTRAVENOUS | Status: DC | PRN
  Administered 2017-12-23: 16:00:00 via INTRAVENOUS

## 2017-12-23 MED ORDER — LACTATED RINGERS IV SOLN: INTRAVENOUS | Status: DC

## 2017-12-23 MED ORDER — METOCLOPRAMIDE HCL 5 MG PO TABS
5.0000 mg | ORAL_TABLET | Freq: Three times a day (TID) | ORAL | Status: DC | PRN
  Filled 2017-12-23: qty 2

## 2017-12-23 MED ORDER — CLINDAMYCIN PHOSPHATE 900 MG/50ML IV SOLN
900.0000 mg | INTRAVENOUS | Status: DC
  Filled 2017-12-23: qty 50

## 2017-12-23 MED ORDER — SERTRALINE HCL 50 MG PO TABS
25.0000 mg | ORAL_TABLET | Freq: Every day | ORAL | Status: DC
  Administered 2017-12-24 – 2017-12-26 (×2): 25 mg via ORAL
  Filled 2017-12-23 (×3): qty 1

## 2017-12-23 SURGICAL SUPPLY — 1 items: PILLOW ABDUCTION HIP (SOFTGOODS) ×2 IMPLANT

## 2017-12-23 NOTE — Progress Notes (Signed)
Orthopedic Tech Progress Note Patient Details:  Thomas Hooper 16-May-1925 [id number]  Patient ID: Thomas James., male   DOB: 02/16/1925, 84 y.o.   MRN: [id number]   [name] 12/23/2017, 5:43 PMCalled [employer] for left hip abduction brace.

## 2017-12-23 NOTE — Op Note (Signed)
OPERATIVE REPORT   12/23/2017  4:24 PM  PATIENT:  Thomas Chadderdon Jr.   SURGEON:  Bertram Savin, MD  ASSISTANT:  Staff.   PREOPERATIVE DIAGNOSIS:  Dislocated left total hip arthroplasty.  POSTOPERATIVE DIAGNOSIS:  Same.  PROCEDURE:  Closed reduction of left hip.  ANESTHESIA:   MAC.  ANTIBIOTICS:  N/A.  IMPLANTS:  None.  SPECIMENS:  None.  COMPLICATIONS:  None.  DISPOSITION:  Stable to PACU.  SURGICAL INDICATIONS:  Thomas Hooper. is a 84 y.o. male with a diagnosis of dislocated left total hip arthroplasty.  The risks, benefits, and alternatives were discussed with the patient preoperatively including but not limited to the risks of repeat instability, cardiopulmonary complications, the need for repeat surgery, among others, and the patient was willing to proceed.  PROCEDURE IN DETAIL: The patient was identified in the holding area using 2 identifiers.  The surgical site was marked by myself.  He was taken to the operating room, and placed supine on the operating room table.  MAC was provided by anesthesia.  A timeout was called, verifying site and site of surgery.  Antibiotics were not given, as none were indicated.  I was able to reduce the hip with a single reduction maneuver.  There was an audible and palpable clunk.  The hip was stable.  Portable hip x-ray was performed confirming reduction.  Abduction pillow was placed.  The patient was then aroused from anesthesia and taken the PACU in stable condition.  POSTOPERATIVE PLAN: Postoperatively, the patient will be readmitted to the hospitalist service.  I have ordered a hip abduction brace from [employer].  Once he has the brace, he may mobilize out of bed with physical therapy.  He is to wear the hip abduction brace at all times except for daily hygiene.  Posterior hip precautions.  The patient may be discharged once he has cleared physical therapy.  I will see him in the office 2 weeks after discharge.

## 2017-12-23 NOTE — H&P (Signed)
History and Physical    Thomas Sermersheim Jr. TDV:[id number] DOB: 1924/09/29 DOA: 12/23/2017  I have briefly reviewed the patient's prior medical records in [place]  PCP: Nona Dell, Corene Cornea, MD  Patient coming from: [facility]  Chief Complaint: hip dislocation  HPI: Thomas Hooper. is a 84 y.o. male with medical history significant of HTN, systolic CHF, chronic kidney disease stage III, mild dementia, who was admitted to [facility] day before yesterday after having a fall at home and noted severe hip and his pain.  He has a history of bilateral hip replacements years ago, bilateral knee replacements, and in [place] ED he was found to have the left hip dislocated.  He had several attempts to put back his hip but was unsuccessful.  Eventually he was eventually taken to the operating room on 7/18 and that was unsuccessful as well.  Hospitalist at [place] discussed with Dr. Delfino Lovett and patient was transferred.  Currently patient received Dilaudid and is alert but appears quite confused and cannot contribute much to the story but does recall that he fell at home.  No reported fever or chills, no reported chest pains, palpitations or any other issues.  No reported loss of consciousness.   Review of Systems: Unable to obtain review of systems due to underlying dementia  Past Medical History:  Diagnosis Date  . Atrial fibrillation ([place])   . Chronic kidney disease    Stage 3  . Dementia   . Hypertension   . Neuropathy   . Thyroid disease   . TIA (transient ischemic attack)     Past Surgical History:  Procedure Laterality Date  . BACK SURGERY    . CHOLECYSTECTOMY    . HIP SURGERY    . KNEE SURGERY    . LOOP RECORDER INSERTION N/A 05/10/2017   Procedure: LOOP RECORDER INSERTION;  Surgeon: Constance Haw, MD;  Location: [place] [facility];  Service: Cardiovascular;  Laterality: N/A;  . SHOULDER SURGERY       reports that he has never smoked. He has never used smokeless  tobacco. He reports that he does not drink alcohol or use drugs.  Allergies  Allergen Reactions  . Prednisone Other (See Comments)    Causes Afib  . Penicillins Other (See Comments)    Has patient had a PCN reaction causing immediate rash, facial/tongue/throat swelling, SOB or lightheadedness with hypotension: Unknown Has patient had a PCN reaction causing severe rash involving mucus membranes or skin necrosis: Unknown Has patient had a PCN reaction that required hospitalization: Unknown Has patient had a PCN reaction occurring within the last 10 years: Unknown If all of the above answers are "NO", then may proceed with Cephalosporin use.     Family History  Problem Relation Age of Onset  . Cancer Father   . CAD Brother     Prior to Admission medications   Medication Sig Start Date End Date Taking? Authorizing Provider  acetaminophen (TYLENOL) 650 MG CR tablet Take 650 mg by mouth every 8 (eight) hours as needed for pain.    [provider]  allopurinol (ZYLOPRIM) 100 MG tablet Take 100 mg by mouth daily.  04/28/16   [provider]  aspirin EC 81 MG tablet Take 81 mg by mouth daily.    [provider]  atorvastatin (LIPITOR) 10 MG tablet Take 10 mg by mouth daily.    [provider]  Calcium Carb-Cholecalciferol (CALCIUM 600/VITAMIN D3 PO) Take 1 tablet by mouth daily.  [provider]  cyanocobalamin 1000 MCG tablet Take 1,000 mcg by mouth daily.    [provider]  gabapentin (NEURONTIN) 600 MG tablet Take 600 mg by mouth 4 (four) times daily.    [provider]  ibuprofen (ADVIL,MOTRIN) 200 MG tablet Take 800 mg by mouth every 6 (six) hours as needed for headache or moderate pain.    [provider]  levothyroxine (SYNTHROID, LEVOTHROID) 25 MCG tablet Take 25 mcg by mouth daily before breakfast.  04/21/16   [provider]  lisinopril (PRINIVIL,ZESTRIL) 2.5 MG tablet Take 1 tablet (2.5 mg total) by  mouth daily. Patient taking differently: Take 2.5 mg by mouth at bedtime.  02/28/17 05/29/17  Park Liter, MD  metoprolol tartrate (LOPRESSOR) 25 MG tablet Take 12.5 mg by mouth 2 (two) times daily.    [provider]  Omega-3 Fatty Acids (FISH OIL) 1000 MG CAPS Take 1,000 mg by mouth daily.    [provider]  sertraline (ZOLOFT) 25 MG tablet Take 25 mg by mouth at bedtime.     [provider]  VOLTAREN 1 % GEL Apply 1 application topically daily as needed (for pain).  02/15/17   [provider]    Physical Exam: Vitals:   12/23/17 1400  Pulse: 66  Resp: 18  SpO2: 100%      Constitutional: NAD, calm, comfortable Eyes: PERRL, lids and conjunctivae normal ENMT: Mucous membranes are moist.  Neck: normal, supple Respiratory: clear to auscultation bilaterally, no wheezing, no crackles. Normal respiratory effort. No accessory muscle use.  Cardiovascular: Regular rate and rhythm, no murmurs / rubs / gallops. No extremity edema. 2+ pedal pulses.   Abdomen: no tenderness, no masses palpated. Bowel sounds positive.  Musculoskeletal: no clubbing / cyanosis. Normal muscle tone.  Skin: no rashes, lesions, ulcers. No induration Neurologic: CN 2-12 grossly intact. Strength 5/5 in all 4.  Psychiatric: confused  Labs on Admission: I have personally reviewed following labs and imaging studies  CBC: No results for input(s): WBC, NEUTROABS, HGB, HCT, MCV, PLT in the last 168 hours. Basic Metabolic Panel: No results for input(s): NA, K, CL, CO2, GLUCOSE, BUN, CREATININE, CALCIUM, MG, PHOS in the last 168 hours. GFR: CrCl cannot be calculated (No order found.). Liver Function Tests: No results for input(s): AST, ALT, ALKPHOS, BILITOT, PROT, ALBUMIN in the last 168 hours. No results for input(s): LIPASE, AMYLASE in the last 168 hours. No results for input(s): AMMONIA in the last 168 hours. Coagulation Profile: No results for input(s): INR, PROTIME in the  last 168 hours. Cardiac Enzymes: No results for input(s): CKTOTAL, CKMB, CKMBINDEX, TROPONINI in the last 168 hours. BNP (last 3 results) No results for input(s): PROBNP in the last 8760 hours. HbA1C: No results for input(s): HGBA1C in the last 72 hours. CBG: No results for input(s): GLUCAP in the last 168 hours. Lipid Profile: No results for input(s): CHOL, HDL, LDLCALC, TRIG, CHOLHDL, LDLDIRECT in the last 72 hours. Thyroid Function Tests: No results for input(s): TSH, T4TOTAL, FREET4, T3FREE, THYROIDAB in the last 72 hours. Anemia Panel: No results for input(s): VITAMINB12, FOLATE, FERRITIN, TIBC, IRON, RETICCTPCT in the last 72 hours. Urine analysis: No results found for: COLORURINE, APPEARANCEUR, LABSPEC, PHURINE, GLUCOSEU, HGBUR, BILIRUBINUR, KETONESUR, PROTEINUR, UROBILINOGEN, NITRITE, LEUKOCYTESUR   Radiological Exams on Admission: No results found.   Assessment/Plan Active Problems:   Hip dislocation, left [facility])   Hip dislocation -Orthopedic surgery consulted, placed call to Dr. Lyla Glassing, currently he is in the OR and awaiting  callback -PRN Dilaudid, patient apparently was extremely confused with morphine but not so much with Dilaudid  Chronic kidney disease stage III -Creatinine at baseline around 2.2 per [facility] notes, just prior to discharge was 1.6, continue to monitor  Chronic systolic CHF -EF 42-35%, currently appears euvolemic  Hypothyroidism -Continue Synthroid  Mild dementia -Stable  Depression -Continue sertraline  History of TIA -Hold aspirin for now    DVT prophylaxis: SCDs  Code Status: FUll code  Family Communication: daughter at bedside Disposition Plan: TBD Consults called: placed call to Dr Delfino Lovett     Admission status: obs   At the point of initial evaluation, it is my clinical opinion that admission for OBSERVATION is reasonable and necessary because the patient's presenting complaints in the context of their chronic  conditions represent sufficient risk of deterioration or significant morbidity to constitute reasonable grounds for close observation in the hospital setting, but that the patient may be medically stable for discharge from the hospital within 24 to 48 hours.    Marzetta Board, MD [facility] Pager [phone]  If 7PM-7AM, please contact night-coverage [email or web address] Password [facility]  12/23/2017, 2:14 PM

## 2017-12-23 NOTE — Consult Note (Addendum)
Reason for Consult:Left hip dislocation Referring Physician: C Gherghe  [name] [name] is an 84 y.o. male.  HPI: Thomas Hooper was bending over in his garden when he had immediate left hip pain and fell. He was taken to [facility] where several ED closed reductions were attempted unsuccessfully. He was taken to the OR for an attempt and that was unsuccessful as well. He was transferred to [facility] for further care. He is under the effects of his pain medicine and could not contribute much to history. His family in the room supplied the details.  Past Medical History:  Diagnosis Date  . Atrial fibrillation ([place])   . Chronic kidney disease    Stage 3  . Dementia   . Hypertension   . Neuropathy   . Thyroid disease   . TIA (transient ischemic attack)     Past Surgical History:  Procedure Laterality Date  . BACK SURGERY    . CHOLECYSTECTOMY    . HIP SURGERY    . KNEE SURGERY    . LOOP RECORDER INSERTION N/A 05/10/2017   Procedure: LOOP RECORDER INSERTION;  Surgeon: Constance Haw, MD;  Location: [place] [facility];  Service: Cardiovascular;  Laterality: N/A;  . SHOULDER SURGERY      Family History  Problem Relation Age of Onset  . Cancer Father   . CAD Brother     Social History:  reports that he has never smoked. He has never used smokeless tobacco. He reports that he does not drink alcohol or use drugs.  Allergies:  Allergies  Allergen Reactions  . Prednisone Other (See Comments)    Causes Afib  . Penicillins Other (See Comments)    Has patient had a PCN reaction causing immediate rash, facial/tongue/throat swelling, SOB or lightheadedness with hypotension: Unknown Has patient had a PCN reaction causing severe rash involving mucus membranes or skin necrosis: Unknown Has patient had a PCN reaction that required hospitalization: Unknown Has patient had a PCN reaction occurring within the last 10 years: Unknown If all of the above answers are "NO", then may proceed with  Cephalosporin use.     Medications: I have reviewed the patient's current medications.  No results found for this or any previous visit (from the past 48 hour(s)).  No results found.  Review of Systems  Unable to perform ROS: Mental acuity   Blood pressure (!) 180/99, pulse 66, temperature 97.8 F (36.6 C), temperature source Oral, resp. rate 18, SpO2 100 %. Physical Exam  Constitutional: He appears well-developed and well-nourished. No distress.  HENT:  Head: Normocephalic and atraumatic.  Eyes: Conjunctivae are normal. Right eye exhibits no discharge. Left eye exhibits no discharge. No scleral icterus.  Neck: Normal range of motion.  Cardiovascular: Normal rate and regular rhythm.  Respiratory: Effort normal. No respiratory distress.  Musculoskeletal:  LLE No traumatic wounds or rash  Hip with lateral deformity, ecchymoses  No knee or ankle effusion  Knee stable to varus/ valgus and anterior/posterior stress  Sens DPN, SPN, TN could not assess  Motor EHL, ext, flex, evers grossly intact  DP 1+, PT 1+, No significant edema  Neurological: He is alert.  Skin: Skin is warm and dry. He is not diaphoretic.  Psychiatric: He has a normal mood and affect. His behavior is normal.    Assessment/Plan: S/p THA with left hip dislocation -- To OR for CR of left  by Dr. Lyla Glassing. Please keep NPO. I discussed the situation with the patient's family.  We are  going to try a closed reduction today.  In the event that a closed reduction is not possible, he will require revision arthroplasty early next week as his implants will need to be ordered from the vendor. Multiple medical problems -- per IM    Lisette Abu, PA-C Orthopedic Surgery [phone] 12/23/2017, 2:50 PM

## 2017-12-23 NOTE — Anesthesia Procedure Notes (Signed)
Procedure Name: General with mask airway Date/Time: 12/23/2017 4:19 PM Performed by: Oletta Lamas, CRNA Pre-anesthesia Checklist: Patient identified, Emergency Drugs available, Suction available, Patient being monitored and Timeout performed Patient Re-evaluated:Patient Re-evaluated prior to induction Oxygen Delivery Method: Circle system utilized Preoxygenation: Pre-oxygenation with 100% oxygen Induction Type: IV induction Ventilation: Mask ventilation without difficulty and Oral airway inserted - appropriate to patient size

## 2017-12-23 NOTE — Anesthesia Preprocedure Evaluation (Signed)
Anesthesia Evaluation  Patient identified by MRN, date of birth, ID band Patient awake    Reviewed: Allergy & Precautions, NPO status , Patient's Chart, lab work & pertinent test results  Airway Mallampati: II  TM Distance: >3 FB Neck ROM: Full    Dental no notable dental hx. (+) Edentulous Upper   Pulmonary neg pulmonary ROS,    Pulmonary exam normal breath sounds clear to auscultation       Cardiovascular hypertension, Pt. on medications Normal cardiovascular exam+ dysrhythmias Atrial Fibrillation  Rhythm:Regular Rate:Normal     Neuro/Psych TIAnegative psych ROS   GI/Hepatic negative GI ROS, Neg liver ROS,   Endo/Other  negative endocrine ROS  Renal/GU Renal InsufficiencyRenal disease  negative genitourinary   Musculoskeletal negative musculoskeletal ROS (+)   Abdominal   Peds negative pediatric ROS (+)  Hematology negative hematology ROS (+)   Anesthesia Other Findings   Reproductive/Obstetrics negative OB ROS                             Anesthesia Physical Anesthesia Plan  ASA: II  Anesthesia Plan: General   Post-op Pain Management:    Induction: Intravenous  PONV Risk Score and Plan: 2 and Treatment may vary due to age or medical condition  Airway Management Planned: Mask and LMA  Additional Equipment:   Intra-op Plan:   Post-operative Plan:   Informed Consent: I have reviewed the patients History and Physical, chart, labs and discussed the procedure including the risks, benefits and alternatives for the proposed anesthesia with the patient or authorized representative who has indicated his/her understanding and acceptance.   Dental advisory given  Plan Discussed with: CRNA  Anesthesia Plan Comments:         Anesthesia Quick Evaluation

## 2017-12-23 NOTE — Progress Notes (Signed)
Pt back in room.
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
## 2017-12-23 NOTE — Transfer of Care (Signed)
Immediate Anesthesia Transfer of Care Note  Patient: Thomas Hooper.  Procedure(s) Performed: CLOSED REDUCTION HIP (Left )  Patient Location: PACU  Anesthesia Type:General  Level of Consciousness: drowsy, patient cooperative and responds to stimulation  Airway & Oxygen Therapy: Patient Spontanous Breathing  Post-op Assessment: Report given to RN and Post -op Vital signs reviewed and stable  Post vital signs: Reviewed and stable  Last Vitals:  Vitals Value Taken Time  BP    Temp    Pulse    Resp    SpO2      Last Pain:  Vitals:   12/23/17 1400  TempSrc: Oral         Complications: No apparent anesthesia complications

## 2017-12-23 NOTE — Discharge Instructions (Signed)
Weightbearing as tolerated left lower extremity. Wear hip abduction brace at all times.  Brace may be removed once daily for hygiene. Posterior hip precautions.

## 2017-12-24 NOTE — Progress Notes (Signed)
Subjective: 1 Day Post-Op Procedure(s) (LRB): CLOSED REDUCTION HIP (Left) Patient reports pain as 3 on 0-10 scale.    Objective: Vital signs in last 24 hours: Temp:  [97.5 F (36.4 C)-98.3 F (36.8 C)] 98.3 F (36.8 C) (07/19 2310) Pulse Rate:  [66-81] 81 (07/19 2310) Resp:  [9-18] 18 (07/19 2310) BP: (134-180)/(77-99) 180/95 (07/19 2310) SpO2:  [96 %-100 %] 100 % (07/19 2310) Weight:  [68.5 kg (151 lb 0.2 oz)] 68.5 kg (151 lb 0.2 oz) (07/19 1554)  Intake/Output from previous day: 07/19 0701 - 07/20 0700 In: 300 [I.V.:300] Out: 11 [Urine:11] Intake/Output this shift: No intake/output data recorded.  Recent Labs    12/23/17 1451  HGB 11.2*   Recent Labs    12/23/17 1451  WBC 10.3  RBC 3.25*  HCT 34.8*  PLT 85*   Recent Labs    12/23/17 1451  NA 140  K 4.5  CL 110  CO2 22  BUN 24*  CREATININE 1.65*  GLUCOSE 97  CALCIUM 8.6*   No results for input(s): LABPT, INR in the last 72 hours.  Neurologically intact Sensation intact distally Dorsiflexion/Plantar flexion intact Compartment soft  Brace intact. Hip located.  Assessment/Plan: 1 Day Post-Op Procedure(s) (LRB): CLOSED REDUCTION HIP (Left) Advance diet Up with therapy Discharge to SNF later    Lyndell Gillyard C 12/24/2017, 8:15 AM

## 2017-12-24 NOTE — Clinical Social Work Note (Signed)
Pt will need 3 night qualifying stay in order for Medicare to cover SNF.   Loletha Grayer, MSW [phone]

## 2017-12-24 NOTE — Clinical Social Work Note (Signed)
Clinical Social Work Assessment  Patient Details  Name: Thomas Hooper. MRN: [id number] Date of Birth: 28-Mar-1925  Date of referral:  12/24/17               Reason for consult:  Facility Placement                Permission sought to share information with:  Family Supports Permission granted to share information::  Yes, Release of Information Signed  Name::     Administrator, arts::     Relationship::  Daughter  Contact Information:     Housing/Transportation Living arrangements for the past 2 months:  Single Family Home Source of Information:  Other (Comment Required)(Grandaughter) Patient Interpreter Needed:  None Criminal Activity/Legal Involvement Pertinent to Current Situation/Hospitalization:  No - Comment as needed Significant Relationships:  Other Family Members Lives with:  Self Do you feel safe going back to the place where you live?  No Need for family participation in patient care:  No (Coment)  Care giving concerns:  Pt is only alert to self. CSW spoke with pt's grandaughter , [name] via telephone.   Social Worker assessment / plan:  CSW spoke with [name] via telephone. At this time [name] wants to look at all her options, she really does not want pt to go to SNF. Pt states she would be with him 24/7 if he could return home with home health. CSW explained pt is a +2 assist right now--[name] understanding. [name] agreeable to f/o to [facility]  and [facility] [employer], Granddaughter would still prefer to wait to closer to d/c to see if pt could go home with home health.  Employment status:  Retired Forensic scientist:  Medicare PT Recommendations:  [place] / Referral to community resources:  [place]  Patient/Family's Response to care:  Pt' Granddaughter verbalized understanding of CSW role and expressed appreciation for support. [name] denies any concern  regarding pt care at this time.   Patient/Family's Understanding of and Emotional Response to Diagnosis, Current Treatment, and Prognosis:  Pt' Granddaughter understanding and realistic regarding pt's physical limitations.-- however [name] would prefer pt go home with home health and [name] would live with pt. [name] wants to view all her options and discuss final plan closer to d/c.   Emotional Assessment Appearance:  Appears stated age Attitude/Demeanor/Rapport:  Unable to Assess Affect (typically observed):  Unable to Assess Orientation:  Oriented to Self Alcohol / Substance use:  Not Applicable Psych involvement (Current and /or in the community):  No (Comment)  Discharge Needs  Concerns to be addressed:  Basic Needs, Care Coordination Readmission within the last 30 days:  No Current discharge risk:  Dependent with Mobility Barriers to Discharge:  Continued Medical Work up   [employer], LCSW 12/24/2017, 3:23 PM

## 2017-12-24 NOTE — Evaluation (Signed)
Physical Therapy Evaluation Patient Details Name: Thomas Hooper. MRN: [id number] DOB: 1925/05/08 Today's Date: 12/24/2017   History of Present Illness  Pt is a 84 y.o. male with medical history significant of HTN, systolic CHF, chronic kidney disease stage III, mild dementia, s/p B THA, and s/p B TKA.  He was bending over in his garden when he had immediate left hip pain and fell.  He was taken to [facility] and found to have a dislocated L hip. Attempts at closed reduction failed at [facility]. Pt was, therefore, transfered to [facility] and underwent successful closed reduction in the OR 12-23-17.     Clinical Impression  Pt admitted with above diagnosis. Pt currently with functional limitations due to the deficits listed below (see PT Problem List). On eval, pt required max assist bed mobility and +2 max assist transfers. Pt will benefit from skilled PT to increase their independence and safety with mobility to allow discharge to the venue listed below.       Follow Up Recommendations SNF    Equipment Recommendations  Other (comment)(defer to next venue)    Recommendations for Other Services       Precautions / Restrictions Precautions Precautions: Fall;Posterior Hip Required Braces or Orthoses: Other Brace/Splint Other Brace/Splint: L hip abduction brace at all times except for hygiene.  Restrictions Weight Bearing Restrictions: Yes LLE Weight Bearing: Weight bearing as tolerated      Mobility  Bed Mobility Overal bed mobility: Needs Assistance Bed Mobility: Supine to Sit     Supine to sit: HOB elevated;Max assist     General bed mobility comments: use of bed pad to scoot to EOB, cues for sequencing  Transfers Overall transfer level: Needs assistance   Transfers: Sit to/from Stand;Stand Pivot Transfers Sit to Stand: +2 safety/equipment;Max assist Stand pivot transfers: +2 physical assistance;Max assist       General transfer comment: sit to stand with RW x 1 trial. Pt  unable to achieve full upright stance, with COG positioned anterior of BOS. Pt returned to EOB and SPT transfer performed toward right. Pt would be good candidate for the stedy.   Ambulation/Gait             General Gait Details: unable  Stairs            Wheelchair Mobility    Modified Rankin (Stroke Patients Only)       Balance Overall balance assessment: Needs assistance Sitting-balance support: Single extremity supported;Feet supported Sitting balance-Leahy Scale: Fair     Standing balance support: During functional activity;Bilateral upper extremity supported Standing balance-Leahy Scale: Zero Standing balance comment: reliant on external support of +2 assist                             Pertinent Vitals/Pain Pain Assessment: Faces Faces Pain Scale: Hurts little more Pain Location: L hip Pain Descriptors / Indicators: Grimacing;Guarding Pain Intervention(s): Monitored during session;Repositioned    Home Living Family/patient expects to be discharged to:: Skilled nursing facility                      Prior Function           Comments: Unsure of pt's home set up or PLOF. He is a poor historian and no family present. I discern he was independent with mobility due to his fall occuring in his garden.      Hand Dominance  Extremity/Trunk Assessment   Upper Extremity Assessment Upper Extremity Assessment: Overall WFL for tasks assessed    Lower Extremity Assessment Lower Extremity Assessment: LLE deficits/detail LLE Deficits / Details: hip abduction brace in place throughout eval    Cervical / Trunk Assessment Cervical / Trunk Assessment: Kyphotic  Communication   Communication: HOH  Cognition Arousal/Alertness: Awake/alert Behavior During Therapy: WFL for tasks assessed/performed Overall Cognitive Status: Impaired/Different from baseline Area of Impairment: Orientation;Attention;Memory;Following  commands;Safety/judgement;Problem solving;Awareness                 Orientation Level: Disoriented to;Place;Time;Situation Current Attention Level: Sustained Memory: Decreased short-term memory Following Commands: Follows one step commands with increased time;Follows one step commands inconsistently Safety/Judgement: Decreased awareness of safety;Decreased awareness of deficits Awareness: Emergent Problem Solving: Slow processing;Difficulty sequencing;Requires verbal cues;Requires tactile cues General Comments: mild agitation and frustration noted. Sitter in room at time of eval. Pt agitated and combative overnight following sx. He pulled out his IV.       General Comments      Exercises     Assessment/Plan    PT Assessment Patient needs continued PT services  PT Problem List Decreased mobility;Decreased safety awareness;Decreased knowledge of precautions;Decreased activity tolerance;Decreased cognition;Pain;Decreased balance;Decreased knowledge of use of DME       PT Treatment Interventions DME instruction;Therapeutic activities;Gait training;Therapeutic exercise;Patient/family education;Balance training;Functional mobility training    PT Goals (Current goals can be found in the Care Plan section)  Acute Rehab PT Goals Patient Stated Goal: not stated PT Goal Formulation: Patient unable to participate in goal setting Time For Goal Achievement: 01/07/18 Potential to Achieve Goals: Fair    Frequency Min 3X/week   Barriers to discharge        Co-evaluation               AM-PAC PT "6 Clicks" Daily Activity  Outcome Measure Difficulty turning over in bed (including adjusting bedclothes, sheets and blankets)?: Unable Difficulty moving from lying on back to sitting on the side of the bed? : Unable Difficulty sitting down on and standing up from a chair with arms (e.g., wheelchair, bedside commode, etc,.)?: Unable Help needed moving to and from a bed to chair  (including a wheelchair)?: A Lot Help needed walking in hospital room?: Total Help needed climbing 3-5 steps with a railing? : Total 6 Click Score: 7    End of Session Equipment Utilized During Treatment: Gait belt;Other (comment)(L hip abduction brace) Activity Tolerance: Patient tolerated treatment well Patient left: in chair;with call bell/phone within reach;with nursing/sitter in room Nurse Communication: Mobility status;Need for lift equipment PT Visit Diagnosis: Other abnormalities of gait and mobility (R26.89)    Time: 1050-1110 PT Time Calculation (min) (ACUTE ONLY): 20 min   Charges:   PT Evaluation $PT Eval Moderate Complexity: 1 Mod     PT G Codes:        Lorrin Goodell, PT  Office # [phone] Pager [phone]   Lorriane Shire 12/24/2017, 2:18 PM

## 2017-12-24 NOTE — Progress Notes (Signed)
[facility]                                                                              Patient Demographics  Thomas Hooper, is a 84 y.o. male, DOB - May 03, 1925, ZOX:[id number]  Admit date - 12/23/2017   Admitting Physician Little Ishikawa, MD  Outpatient Primary MD for the patient is Townsend Roger, MD  Outpatient specialists:   LOS - 1  days    No chief complaint on file.      Brief summary  Thomas Stockinger [name] is a 84 y.o. male with medical history significant of HTN, systolic CHF, chronic kidney disease stage III, mild dementia, who was transferred from [facility] to [facility] for dislocated left hip arthroplasty with failed attempted close reduction at the outside facility.   Assessment & Plan    Active Problems:   Hip dislocation, left (HCC)   Status post closed reduction of dislocated total hip prosthesis   Hip dislocation Status post closed reduction of dislocated left hip arthroplasty 12/23/2017 Pain control PT/OT  Hypertension BP high this morning,had been okay yesterday Nursing to repeat and consider further optimize addition as needed if still high  Chronic kidney disease stage III Creatinine at baseline around 2.2 Monitor clinically   Chronic systolic CHF EF 81-19% currently appears euvolemic Supportive care  Hypothyroidism -Continue Synthroid  Mild dementia Stable  Depression Continue sertraline  History of TIA Hold aspirin for now     Code Status: full code DVT Prophylaxis:  SCD's Family Communication: Discussed in detail with the patient, all imaging results, lab results explained to the patient or grandaughter [name] with Husband at bedside   Disposition Plan: SNF vs HHOT/PT/Aide  Time Spent in minutes  32 minutes  Procedures:  Closed reduction of dislocated left hip arthroplasty  Consultants:   Orthopedist,Dr Delfino Lovett     Antimicrobials:      Medications  Scheduled Meds: . atorvastatin   10 mg Oral q1800  . docusate sodium  100 mg Oral BID  . enoxaparin (LOVENOX) injection  30 mg Subcutaneous Q24H  . levothyroxine  25 mcg Oral QAC breakfast  . metoprolol tartrate  12.5 mg Oral BID  . sertraline  25 mg Oral QHS   Continuous Infusions: PRN Meds:.HYDROmorphone (DILAUDID) injection, metoCLOPramide **OR** metoCLOPramide (REGLAN) injection, ondansetron **OR** ondansetron (ZOFRAN) IV   Antibiotics   Anti-infectives (From admission, onward)   Start     Dose/Rate Route Frequency Ordered Stop   12/24/17 0600  clindamycin (CLEOCIN) IVPB 900 mg  Status:  Discontinued     900 mg 100 mL/hr over 30 Minutes Intravenous On call to O.R. 12/23/17 1531 12/23/17 1705        Subjective:   Thomas Hooper was seen and examined today. Patient sitting comfortably in chair, alert and oriented 1. Denies any pain. The scout discharge panel granddaughter [name] who indicated that they have been told earlier by a previous orthopedist that patient will be going home. She doesn't want him to go to snf at this point if at all possible. I called patient's primary parent to the room to identify discharge plan/general worker assists discharge  planning. I broached the possibility of home with home health PT and OT  Objective:   Vitals:   12/23/17 1554 12/23/17 1640 12/23/17 1655 12/23/17 2310  BP:  134/80 (!) 150/77 (!) 180/95  Pulse:  66 76 81  Resp:  (!) 9 16 18   Temp:  97.7 F (36.5 C) (!) 97.5 F (36.4 C) 98.3 F (36.8 C)  TempSrc:    Axillary  SpO2:  96% 100% 100%  Weight: 68.5 kg (151 lb 0.2 oz)     Height: 5\' 10"  (1.778 m)       Intake/Output Summary (Last 24 hours) at 12/24/2017 1200 Last data filed at 12/24/2017 1000 Gross per 24 hour  Intake 540 ml  Output 11 ml  Net 529 ml     Wt Readings from Last 3 Encounters:  12/23/17 68.5 kg (151 lb 0.2 oz)  05/10/17 68.5 kg (151 lb)  04/27/17 68.5 kg (151 lb)     Exam  General: NAD  HEENT: NCAT,  PERRL,MMM  Neck: SUPPLE, (-)  JVD  Cardiovascular: RRR, (-) GALLOP, (-) MURMUR  Respiratory: CTA  Gastrointestinal: SOFT, (-) DISTENSION, BS(+), (_) TENDERNESS  Ext: (-) CYANOSIS, (-) EDEMA  Neuro: A, OX 1    Data Reviewed:  I have personally reviewed following labs and imaging studies  Micro Results Recent Results (from the past 240 hour(s))  MRSA PCR Screening     Status: None   Collection Time: 12/23/17  3:36 PM  Result Value Ref Range Status   MRSA by PCR NEGATIVE NEGATIVE Final    Comment:        The GeneXpert MRSA Assay (FDA approved for NASAL specimens only), is one component of a comprehensive MRSA colonization surveillance program. It is not intended to diagnose MRSA infection nor to guide or monitor treatment for MRSA infections. Performed at [facility], [place] [address] [place], [place] [address]     Radiology Reports Dg Pelvis Portable  Result Date: 12/23/2017 CLINICAL DATA:  84 year old male status post closed reduction of dislocated left total hip arthroplasty. EXAM: PORTABLE PELVIS 1-2 VIEWS COMPARISON:  [facility] left hip radiographs 12/22/2017. Intraoperative image 16 21 hours today. FINDINGS: Portable AP supine view at 1644 hours. Bilateral total hip arthroplasties appear normally aligned on these AP images. Hardware appears intact. No acute osseous abnormality identified. Aortoiliac and bilateral femoral artery calcified atherosclerosis. Negative visible bowel gas pattern. IMPRESSION: Bilateral total hip arthroplasties with no adverse features. Electronically Signed   By: Genevie Ann M.D.   On: 12/23/2017 16:58   Dg Hip Operative Unilat W Or W/o Pelvis Left  Result Date: 12/23/2017 CLINICAL DATA:  Closed reduction of the left hip EXAM: OPERATIVE LEFT HIP (WITH PELVIS IF PERFORMED) 1 VIEWS TECHNIQUE: Fluoroscopic spot image(s) were submitted for interpretation post-operatively. COMPARISON:  None. FINDINGS: A single spot AP radiograph of the left hip is provided for review  and demonstrates a left total hip replacement prosthesis. Alignment appears anatomic given solitary AP projection. No definite fracture. Limited visualization of the pelvis demonstrates sequela of contralateral right total hip replacement, incompletely evaluated. Degenerative change of the lower lumbar spine is suspected though incompletely evaluated. Vascular calcifications overlie the pelvis and left thigh. No radiopaque foreign body. IMPRESSION: Post reduction of left total hip replacement without evidence of complication. Electronically Signed   By: Sandi Mariscal M.D.   On: 12/23/2017 16:35    Lab Data:  CBC: Recent Labs  Lab 12/23/17 1451  WBC 10.3  HGB 11.2*  HCT  34.8*  MCV 107.1*  PLT 85*   Basic Metabolic Panel: Recent Labs  Lab 12/23/17 1451  NA 140  K 4.5  CL 110  CO2 22  GLUCOSE 97  BUN 24*  CREATININE 1.65*  CALCIUM 8.6*   GFR: Estimated Creatinine Clearance: 28.3 mL/min (A) (by C-G formula based on SCr of 1.65 mg/dL (H)). Liver Function Tests: Recent Labs  Lab 12/23/17 1451  AST 21  ALT 14  ALKPHOS 56  BILITOT 1.1  PROT 5.6*  ALBUMIN 3.1*   No results for input(s): LIPASE, AMYLASE in the last 168 hours. No results for input(s): AMMONIA in the last 168 hours. Coagulation Profile: No results for input(s): INR, PROTIME in the last 168 hours. Cardiac Enzymes: No results for input(s): CKTOTAL, CKMB, CKMBINDEX, TROPONINI in the last 168 hours. BNP (last 3 results) No results for input(s): PROBNP in the last 8760 hours. HbA1C: No results for input(s): HGBA1C in the last 72 hours. CBG: No results for input(s): GLUCAP in the last 168 hours. Lipid Profile: No results for input(s): CHOL, HDL, LDLCALC, TRIG, CHOLHDL, LDLDIRECT in the last 72 hours. Thyroid Function Tests: No results for input(s): TSH, T4TOTAL, FREET4, T3FREE, THYROIDAB in the last 72 hours. Anemia Panel: No results for input(s): VITAMINB12, FOLATE, FERRITIN, TIBC, IRON, RETICCTPCT in the last  72 hours. Urine analysis: No results found for: COLORURINE, APPEARANCEUR, LABSPEC, PHURINE, GLUCOSEU, HGBUR, BILIRUBINUR, KETONESUR, PROTEINUR, UROBILINOGEN, NITRITE, Milus Height M.D. [facility] 12/24/2017, 12:00 PM  Pager: [phone] Between 7am to 7pm - call Pager - [phone]  After 7pm go to [email or web address] - password [email or web address]  Call night coverage person covering after 7pm

## 2017-12-25 ENCOUNTER — Inpatient Hospital Stay (HOSPITAL_COMMUNITY): Payer: Medicare Other

## 2017-12-25 ENCOUNTER — Other Ambulatory Visit: Payer: Self-pay

## 2017-12-25 DIAGNOSIS — R569 Unspecified convulsions: Secondary | ICD-10-CM

## 2017-12-25 DIAGNOSIS — G8384 Todd's paralysis (postepileptic): Secondary | ICD-10-CM

## 2017-12-25 DIAGNOSIS — G40409 Other generalized epilepsy and epileptic syndromes, not intractable, without status epilepticus: Secondary | ICD-10-CM

## 2017-12-25 DIAGNOSIS — Z9889 Other specified postprocedural states: Secondary | ICD-10-CM

## 2017-12-25 DIAGNOSIS — S2239XA Fracture of one rib, unspecified side, initial encounter for closed fracture: Secondary | ICD-10-CM | POA: Diagnosis not present

## 2017-12-25 DIAGNOSIS — S2249XA Multiple fractures of ribs, unspecified side, initial encounter for closed fracture: Secondary | ICD-10-CM | POA: Diagnosis not present

## 2017-12-25 LAB — COMPREHENSIVE METABOLIC PANEL WITH GFR
ALT: 24 U/L (ref 0–44)
AST: 45 U/L — ABNORMAL HIGH (ref 15–41)
Albumin: 3.1 g/dL — ABNORMAL LOW (ref 3.5–5.0)
Alkaline Phosphatase: 63 U/L (ref 38–126)
Anion gap: 17 — ABNORMAL HIGH (ref 5–15)
BUN: 27 mg/dL — ABNORMAL HIGH (ref 8–23)
CO2: 17 mmol/L — ABNORMAL LOW (ref 22–32)
Calcium: 8.8 mg/dL — ABNORMAL LOW (ref 8.9–10.3)
Chloride: 105 mmol/L (ref 98–111)
Creatinine, Ser: 1.8 mg/dL — ABNORMAL HIGH (ref 0.61–1.24)
GFR calc Af Amer: 36 mL/min — ABNORMAL LOW
GFR calc non Af Amer: 31 mL/min — ABNORMAL LOW
Glucose, Bld: 174 mg/dL — ABNORMAL HIGH (ref 70–99)
Potassium: 3.4 mmol/L — ABNORMAL LOW (ref 3.5–5.1)
Sodium: 139 mmol/L (ref 135–145)
Total Bilirubin: 0.8 mg/dL (ref 0.3–1.2)
Total Protein: 6.1 g/dL — ABNORMAL LOW (ref 6.5–8.1)

## 2017-12-25 LAB — BLOOD GAS, ARTERIAL
Acid-base deficit: 14.9 mmol/L — ABNORMAL HIGH (ref 0.0–2.0)
Bicarbonate: 11.7 mmol/L — ABNORMAL LOW (ref 20.0–28.0)
Drawn by: 236041
FIO2: 100
O2 Saturation: 99.3 %
PCO2 ART: 31.8 mmHg — AB (ref 32.0–48.0)
PH ART: 7.191 — AB (ref 7.350–7.450)
PO2 ART: 449 mmHg — AB (ref 83.0–108.0)
Patient temperature: 98.6

## 2017-12-25 LAB — TROPONIN I
TROPONIN I: 0.38 ng/mL — AB (ref <0.03)
TROPONIN I: 0.41 ng/mL — AB (ref <0.03)
Troponin I: 0.39 ng/mL (ref <0.03)

## 2017-12-25 LAB — CBC
HEMATOCRIT: 39.9 % (ref 39.0–52.0)
Hemoglobin: 12.4 g/dL — ABNORMAL LOW (ref 13.0–17.0)
MCH: 34.3 pg — AB (ref 26.0–34.0)
MCHC: 31.1 g/dL (ref 30.0–36.0)
MCV: 110.5 fL — AB (ref 78.0–100.0)
Platelets: 100 10*3/uL — ABNORMAL LOW (ref 150–400)
RBC: 3.61 MIL/uL — ABNORMAL LOW (ref 4.22–5.81)
RDW: 14.4 % (ref 11.5–15.5)
WBC: 15.5 10*3/uL — ABNORMAL HIGH (ref 4.0–10.5)

## 2017-12-25 LAB — BASIC METABOLIC PANEL
Anion gap: 9 (ref 5–15)
BUN: 31 mg/dL — AB (ref 8–23)
CALCIUM: 8.3 mg/dL — AB (ref 8.9–10.3)
CHLORIDE: 107 mmol/L (ref 98–111)
CO2: 21 mmol/L — AB (ref 22–32)
Creatinine, Ser: 1.64 mg/dL — ABNORMAL HIGH (ref 0.61–1.24)
GFR calc Af Amer: 41 mL/min — ABNORMAL LOW (ref >60)
GFR calc non Af Amer: 35 mL/min — ABNORMAL LOW (ref >60)
Glucose, Bld: 108 mg/dL — ABNORMAL HIGH (ref 70–99)
POTASSIUM: 4.4 mmol/L (ref 3.5–5.1)
SODIUM: 137 mmol/L (ref 135–145)

## 2017-12-25 LAB — GLUCOSE, CAPILLARY
GLUCOSE-CAPILLARY: 126 mg/dL — AB (ref 70–99)
GLUCOSE-CAPILLARY: 118 mg/dL — AB (ref 70–99)
Glucose-Capillary: 113 mg/dL — ABNORMAL HIGH (ref 70–99)
Glucose-Capillary: 142 mg/dL — ABNORMAL HIGH (ref 70–99)
Glucose-Capillary: 110 mg/dL — ABNORMAL HIGH (ref 70–99)

## 2017-12-25 LAB — PHOSPHORUS: Phosphorus: 3.1 mg/dL (ref 2.5–4.6)

## 2017-12-25 LAB — LACTIC ACID, PLASMA
LACTIC ACID, VENOUS: 3.9 mmol/L — AB (ref 0.5–1.9)
LACTIC ACID, VENOUS: 2 mmol/L — AB (ref 0.5–1.9)

## 2017-12-25 LAB — PROCALCITONIN: Procalcitonin: 0.24 ng/mL

## 2017-12-25 LAB — MAGNESIUM: Magnesium: 2 mg/dL (ref 1.7–2.4)

## 2017-12-25 MED ORDER — LIDOCAINE 5 % EX PTCH
1.0000 | MEDICATED_PATCH | CUTANEOUS | Status: DC
  Filled 2017-12-25: qty 1

## 2017-12-25 MED ORDER — VALPROATE SODIUM 500 MG/5ML IV SOLN
250.0000 mg | Freq: Four times a day (QID) | INTRAVENOUS | Status: DC
  Administered 2017-12-25 – 2017-12-26 (×4): 250 mg via INTRAVENOUS
  Filled 2017-12-25 (×6): qty 2.5

## 2017-12-25 MED ORDER — LIDOCAINE 5 % EX PTCH
1.0000 | MEDICATED_PATCH | CUTANEOUS | Status: DC
  Administered 2017-12-25 – 2017-12-28 (×3): 1 via TRANSDERMAL
  Filled 2017-12-25 (×6): qty 1

## 2017-12-25 MED ORDER — LIDOCAINE 5 % EX PTCH
1.0000 | MEDICATED_PATCH | CUTANEOUS | Status: DC
  Administered 2017-12-25: 1 via TRANSDERMAL
  Filled 2017-12-25 (×2): qty 1

## 2017-12-25 MED ORDER — POTASSIUM CHLORIDE 10 MEQ/100ML IV SOLN
10.0000 meq | INTRAVENOUS | Status: AC
  Administered 2017-12-25 (×2): 10 meq via INTRAVENOUS
  Filled 2017-12-25 (×2): qty 100

## 2017-12-25 MED ORDER — ORAL CARE MOUTH RINSE
15.0000 mL | Freq: Two times a day (BID) | OROMUCOSAL | Status: DC
  Administered 2017-12-25 – 2017-12-28 (×4): 15 mL via OROMUCOSAL

## 2017-12-25 MED ORDER — ACETAMINOPHEN 10 MG/ML IV SOLN
1000.0000 mg | Freq: Four times a day (QID) | INTRAVENOUS | Status: AC
  Administered 2017-12-25 – 2017-12-26 (×4): 1000 mg via INTRAVENOUS
  Filled 2017-12-25 (×4): qty 100

## 2017-12-25 MED ORDER — SODIUM CHLORIDE 0.9 % IV SOLN: 250.0000 mL | INTRAVENOUS | Status: DC | PRN

## 2017-12-25 MED ORDER — SODIUM CHLORIDE 0.9 % IV SOLN
INTRAVENOUS | Status: DC
  Administered 2017-12-25 – 2017-12-28 (×5): via INTRAVENOUS

## 2017-12-25 MED ORDER — HYDROMORPHONE HCL 1 MG/ML IJ SOLN
0.2500 mg | INTRAMUSCULAR | Status: DC | PRN
  Administered 2017-12-26 – 2017-12-29 (×5): 0.25 mg via INTRAVENOUS
  Filled 2017-12-25: qty 0.5
  Filled 2017-12-25 (×3): qty 1
  Filled 2017-12-25 (×2): qty 0.5

## 2017-12-25 MED ORDER — CHLORHEXIDINE GLUCONATE 0.12 % MT SOLN
15.0000 mL | Freq: Two times a day (BID) | OROMUCOSAL | Status: DC
  Administered 2017-12-25 – 2017-12-30 (×8): 15 mL via OROMUCOSAL
  Filled 2017-12-25 (×8): qty 15

## 2017-12-25 MED ORDER — VALPROATE SODIUM 500 MG/5ML IV SOLN
1500.0000 mg | Freq: Once | INTRAVENOUS | Status: AC
  Administered 2017-12-25: 1500 mg via INTRAVENOUS
  Filled 2017-12-25: qty 15

## 2017-12-25 MED ORDER — METOPROLOL TARTRATE 5 MG/5ML IV SOLN
2.5000 mg | Freq: Four times a day (QID) | INTRAVENOUS | Status: DC
  Administered 2017-12-25 – 2017-12-27 (×8): 2.5 mg via INTRAVENOUS
  Filled 2017-12-25 (×8): qty 5

## 2017-12-25 NOTE — Progress Notes (Signed)
Per family, " Do not give him any pain medicine other than Tylenol, or ibuprofen!". Pt in pain, nodding yes when asked. Pt yelled, " What in God's name?!" when pulled up in bed." Will notify MD and monitor closely.  Lucius Conn, RN

## 2017-12-25 NOTE — Progress Notes (Signed)
Clarification regarding sequence of events re: Code Blue. Thomas Hooper's nurse, [facility] RN, reported she heard some noise and a yell coming from his room.  When she entered the room, she noticed Thomas Hooper's right arm moving in a rhythmic motion like in a decorticate posture position.  He was unresponsive, then became apneic and pulseless.  CPR was initiated and code blue was called.  Pulseless rhythm looked to be afib.  Pt did not have established PIV at time of arrest because he pulled his PIV out earlier in the shift.  No epinephrine was administered during arrest due to no PIV and no advanced airway.  ROSC was obtained after approximately 5 mins of CPR. Then PIVs were obtained. Pt is responsive to only deep noxious stimuli.  He has a deviated forced gaze to the right.  After approximately 45 mins while obtaining labs and xray,  Thomas Hooper started to respond to verbal stimuli, track and answer to his name.  He was transported to CT and then to 2H08.

## 2017-12-25 NOTE — Progress Notes (Signed)
CRITICAL VALUE ALERT  Critical Value:  Troponin 0.38  Date & Time Notied:  12/25/2017 0900  Provider Notified: Lynetta Mare, MD  Orders Received/Actions taken: No new orders at this time.   Lucius Conn, RN

## 2017-12-25 NOTE — Assessment & Plan Note (Signed)
History of chronic atrial fibrillation on metoprolol.  On examination: HR 101 in AF.  Patient warm and well perfused.  S1-S2 normal with no murmurs or gallops.  No peripheral edema.  Assessment and plan: Hemodynamically stable rate controlled atrial fibrillation.  Currently unable to take oral medications due to confusion.  Will substitute iv metoprolol until eating.

## 2017-12-25 NOTE — Consult Note (Signed)
NEURO HOSPITALIST CONSULT NOTE   Requestig physician: Dr. Vista Lawman  Reason for Consult: New onset seizure  History obtained from:  Nursing Staff, CCM Team and Chart     HPI:                                                                                                                                          Thomas Dewitt [name] is an 84 y.o. male who was admitted to [facility] on 7/17 with severe left hip pain secondary to prosthetic left hip dislocation. They attempted reduction but were unable, so the patient was transferred to [facility]. The hip was reduced under anesthesia here at [facility] by orthopedics on 7/19. He has been doing well during recovery and has been undergoing PT on 5N. He was noted to be fully conversant with staff this shift. Early this morning, he suddenly yelled out and nurse came immediately to his room. He was observed to be shaking his RUE which was adducted at the shoulder, sharply flexed at the elbow and sharply flexed at the wrist in a contorted position. Eyes were rolled up in his head and also were observed to have forced deviation towards the right during the episode. He was not responsive to external stimuli during the spell. Also noted was tongue being clenched between his teeth with some foaming seen at his mouth. The episode subsided abruptly after which the patient had gradually recovering responsiveness. During the spell, a code was called for what was thought to be PEA arrest, but on arrival of rapid response his BP was noted to be elevated, inconsistent with cardiac arrest. Rhythm after the code procedure was stopped was noted to be a-fib with RVR. Neurology was consulted by CCM for possible seizure. He has no known history of seizures. Of note, he has a diagnosis of dementia.    Past Medical History:  Diagnosis Date  . Atrial fibrillation ([place])   . Chronic kidney disease    Stage 3  . Dementia   . Hypertension   . Neuropathy   . Thyroid  disease   . TIA (transient ischemic attack)     Past Surgical History:  Procedure Laterality Date  . BACK SURGERY    . CHOLECYSTECTOMY    . HIP SURGERY    . KNEE SURGERY    . LOOP RECORDER INSERTION N/A 05/10/2017   Procedure: LOOP RECORDER INSERTION;  Surgeon: Constance Haw, MD;  Location: [facility];  Service: Cardiovascular;  Laterality: N/A;  . SHOULDER SURGERY      Family History  Problem Relation Age of Onset  . Cancer Father   . CAD Brother               Social History:  reports that he has  never smoked. He has never used smokeless tobacco. He reports that he does not drink alcohol or use drugs.  Allergies  Allergen Reactions  . Prednisone Other (See Comments)    Causes Afib  . Morphine And Related Other (See Comments)    Agitation, "made him mean" per granddaughter  . Penicillins Other (See Comments)    Has patient had a PCN reaction causing immediate rash, facial/tongue/throat swelling, SOB or lightheadedness with hypotension: Unknown Has patient had a PCN reaction causing severe rash involving mucus membranes or skin necrosis: Unknown Has patient had a PCN reaction that required hospitalization: Unknown Has patient had a PCN reaction occurring within the last 10 years: Unknown If all of the above answers are "NO", then may proceed with Cephalosporin use.     MEDICATIONS:                                                                                                                     Prior to Admission:  Medications Prior to Admission  Medication Sig Dispense Refill Last Dose  . allopurinol (ZYLOPRIM) 100 MG tablet Take 100 mg by mouth daily.    Past Week at Unknown time  . aspirin EC 81 MG tablet Take 81 mg by mouth daily.   Past Week at Unknown time  . atorvastatin (LIPITOR) 10 MG tablet Take 10 mg by mouth daily.   Past Week at Unknown time  . Calcium Carb-Cholecalciferol (CALCIUM 600/VITAMIN D3 PO) Take 1 tablet by mouth daily.   Past Week at  Unknown time  . cyanocobalamin 1000 MCG tablet Take 1,000 mcg by mouth daily.   Past Week at Unknown time  . FEROSUL 325 (65 Fe) MG tablet Take 325 mg by mouth every morning.   Past Week at Unknown time  . gabapentin (NEURONTIN) 600 MG tablet Take 600 mg by mouth 4 (four) times daily.   Past Week at Unknown time  . ibuprofen (ADVIL,MOTRIN) 200 MG tablet Take 800 mg by mouth daily.    Past Week at Unknown time  . levothyroxine (SYNTHROID, LEVOTHROID) 25 MCG tablet Take 25 mcg by mouth daily before breakfast.    Past Week at Unknown time  . metoprolol tartrate (LOPRESSOR) 25 MG tablet Take 12.5 mg by mouth 2 (two) times daily.   Past Week at Unknown time  . Omega-3 Fatty Acids (FISH OIL) 1000 MG CAPS Take 1,000 mg by mouth daily.   Past Week at Unknown time  . sertraline (ZOLOFT) 25 MG tablet Take 25 mg by mouth at bedtime.    Past Week at Unknown time   Scheduled: . atorvastatin  10 mg Oral q1800  . docusate sodium  100 mg Oral BID  . enoxaparin (LOVENOX) injection  30 mg Subcutaneous Q24H  . levothyroxine  25 mcg Oral QAC breakfast  . metoprolol tartrate  12.5 mg Oral BID  . sertraline  25 mg Oral QHS   Continuous: . sodium chloride    . sodium chloride    . valproate  sodium    . valproate sodium       ROS:                                                                                                                                       Unable to obtain due to postictal state.   Blood pressure (!) 178/92, pulse 77, temperature 98.5 F (36.9 C), temperature source Oral, resp. rate 16, height 5\' 10"  (1.778 m), weight 68.5 kg (151 lb 0.2 oz), SpO2 99 %.   General Examination:                                                                                                       Physical Exam  HEENT-  [place]/AT   Lungs- Respirations unlabored Extremities- Brace to LLE. Warm and well perfused.   Neurological Examination Mental Status: Somnolent to obtunded following seizure. Will open  eyes and say "ow" repeatedly after sustained noxious stimuli. Not following commands. Was able to state his name, but does not answer other questions.  Cranial Nerves: II: Does not blink to threat on limited exam as attempts to keep eyes shut throughout. PERRL.   III,IV, VI: Eyes briefly observed to be conjugate at midline gazing at examiner. Does not cooperate with attempt to elicit doll's eye reflex. No nystagmus.  V,VII: Face symmetric. Reacts to tactile stimuli bilaterally.  VIII: hearing intact to a question IX,X: Mild hypophonia XI: Unable to assess XII: Unable to assess Motor/Sensory: Moves RUE and RLE significantly more briskly to stimuli than LUE and LLE. With sustained noxious, will move LUE with 2-3/5 strength. Will move ankle of LLE but unable to test further due to recent hip dislocation with brace. No jerking or twitching noted. Tone reduced to LUE relative to right.  Deep Tendon Reflexes: 1+ and symmetric bilateral upper extremities. Hypoactive lower extremities.  Plantars: Equivocal bilaterally.  Cerebellar/Gait: Unable to assess.    Lab Results: Basic Metabolic Panel: Recent Labs  Lab 12/23/17 1451 12/25/17 0215  NA 140 139  K 4.5 3.4*  CL 110 105  CO2 22 17*  GLUCOSE 97 174*  BUN 24* 27*  CREATININE 1.65* 1.80*  CALCIUM 8.6* 8.8*    CBC: Recent Labs  Lab 12/23/17 1451 12/25/17 0215  WBC 10.3 15.5*  HGB 11.2* 12.4*  HCT 34.8* 39.9  MCV 107.1* 110.5*  PLT 85* 100*    Cardiac Enzymes: No results for input(s): CKTOTAL, CKMB, CKMBINDEX, TROPONINI in the last 168 hours.  Lipid  Panel: No results for input(s): CHOL, TRIG, HDL, CHOLHDL, VLDL, LDLCALC in the last 168 hours.  Imaging: Dg Pelvis Portable  Result Date: 12/23/2017 CLINICAL DATA:  84 year old male status post closed reduction of dislocated left total hip arthroplasty. EXAM: PORTABLE PELVIS 1-2 VIEWS COMPARISON:  [facility] left hip radiographs 12/22/2017. Intraoperative image 16 21 hours  today. FINDINGS: Portable AP supine view at 1644 hours. Bilateral total hip arthroplasties appear normally aligned on these AP images. Hardware appears intact. No acute osseous abnormality identified. Aortoiliac and bilateral femoral artery calcified atherosclerosis. Negative visible bowel gas pattern. IMPRESSION: Bilateral total hip arthroplasties with no adverse features. Electronically Signed   By: Genevie Ann M.D.   On: 12/23/2017 16:58   Dg Hip Operative Unilat W Or W/o Pelvis Left  Result Date: 12/23/2017 CLINICAL DATA:  Closed reduction of the left hip EXAM: OPERATIVE LEFT HIP (WITH PELVIS IF PERFORMED) 1 VIEWS TECHNIQUE: Fluoroscopic spot image(s) were submitted for interpretation post-operatively. COMPARISON:  None. FINDINGS: A single spot AP radiograph of the left hip is provided for review and demonstrates a left total hip replacement prosthesis. Alignment appears anatomic given solitary AP projection. No definite fracture. Limited visualization of the pelvis demonstrates sequela of contralateral right total hip replacement, incompletely evaluated. Degenerative change of the lower lumbar spine is suspected though incompletely evaluated. Vascular calcifications overlie the pelvis and left thigh. No radiopaque foreign body. IMPRESSION: Post reduction of left total hip replacement without evidence of complication. Electronically Signed   By: Sandi Mariscal M.D.   On: 12/23/2017 16:35    Assessment: 84 year old male with new onset seizure.  1. Currently postictal. 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  2. No prior history of seizures.  3. Most likely has a right cerebral hemisphere seizure focus.   Recommendations: 1. Loaded with valproic acid 20 mg/kg IV x 1. Continue with scheduled dosing at 5 mg/kg IV TID. Can switch to PO at 1:1 ratio when he has fully regained consciousness and is cleared for PO intake.  2. EEG in AM.  3. If he no longer  has a loop recorder and there are no other mechanical/electronic implanted devices, obtain MRI brain. If unable to obtain MRI, get CT head.   35 minutes spent in the neurological evaluation and management of this critically ill patient with new onset seizure and postictal left sided weakness  Electronically signed: Dr. Kerney Elbe 12/25/2017, 3:10 AM

## 2017-12-25 NOTE — Progress Notes (Signed)
Critical care progress note  Reason for ICU admission: Status post witnessed seizure.  History of present illness:  Patient is 84 years old and transferred on 7/17 for severe left hip pain secondary to prosthetic hip dislocation.  Went successful reduction under anesthesia on 7/19.  He was recovering well and undergoing physical therapy and had been previously fully conversant.  He had an unheralded episode of arm flexion eyes rolling unresponsiveness teeth clenching and foaming at the mouth.  Episode ended and was accompanied then by unresponsiveness.  A brief cardiac arrest code was called.  Was found to be hypertensive.  Seen by neurology who felt that this represented a seizure with subsequent Todd's paralysis.  Scheduled Meds: . atorvastatin  10 mg Oral q1800  . chlorhexidine  15 mL Mouth Rinse BID  . docusate sodium  100 mg Oral BID  . enoxaparin (LOVENOX) injection  30 mg Subcutaneous Q24H  . levothyroxine  25 mcg Oral QAC breakfast  . lidocaine  1 patch Transdermal Q24H  . lidocaine  1 patch Transdermal Q24H  . mouth rinse  15 mL Mouth Rinse q12n4p  . metoprolol tartrate  2.5 mg Intravenous Q6H  . sertraline  25 mg Oral QHS   Continuous Infusions: . sodium chloride    . sodium chloride 75 mL/hr at 12/25/17 1400  . acetaminophen Stopped (12/25/17 1055)  . valproate sodium Stopped (12/25/17 1351)   PRN Meds:sodium chloride, HYDROmorphone (DILAUDID) injection, ondansetron **OR** ondansetron (ZOFRAN) IV  Vital signs in last 24 hours: Temp:  [97.6 F (36.4 C)-98.7 F (37.1 C)] 98.4 F (36.9 C) (07/21 1620) Pulse Rate:  [41-129] 67 (07/21 1630) Resp:  [13-28] 17 (07/21 1630) BP: (116-195)/(55-104) 137/89 (07/21 1630) SpO2:  [93 %-100 %] 100 % (07/21 1630) FiO2 (%):  [100 %] 100 % (07/21 0215) Weight:  [147 lb 11.3 oz (67 kg)-151 lb 3.8 oz (68.6 kg)] 147 lb 11.3 oz (67 kg) (07/21 0630)  Intake/Output last 3 shifts: I/O last 3 completed shifts: In: 8466 [P.O.:500;  I.V.:136.2; Other:250; IV Piggyback:138.8] Out: 511 [Urine:511] Intake/Output this shift: Total I/O In: 669.8 [I.V.:524.5; IV Piggyback:145.3] Out: -   Problem Assessment/Plan Cardiovascular and Mediastinum Paroxysmal atrial fibrillation (HCC) Assessment & Plan History of chronic atrial fibrillation on metoprolol.  On examination: HR 101 in AF.  Patient warm and well perfused.  S1-S2 normal with no murmurs or gallops.  No peripheral edema.  Assessment and plan: Hemodynamically stable rate controlled atrial fibrillation.  Currently unable to take oral medications due to confusion.  Will substitute iv metoprolol until eating.  Nervous and Auditory Dementia Assessment & Plan Known chronic dementia generally well compensated at home but very sensitive to any medication changes.  On examination: Patient is alert and awake and orients to voice.  Will to follow simple commands but is not speaking clearly.  He appears in pain.  Tender to palpation of right chest.  Assessment and plan: Acute delirium on the background of dementia.  Driven by recent postictal state change of environment and pain. Minimize psychotropic medications.  Maximize non-opioid medications to control pain.  Musculoskeletal and Integument Rib fractures Assessment & Plan Patient complaining of right chest pain.  Received brief CPR during moment of unresponsiveness.  On examination: Exquisitely tender to palpation of right chest with subcutaneous emphysema consistent with rib fracture.  Patient is saturating 100% on 2 L nasal cannula.  Chest is otherwise clear to auscultation bilaterally.  Chest x-ray interpreted by myself shows possible right third rib fracture.  Nondisplaced.  Assessment and plan: Likely rib fractures due to CPR.  Pain is likely driving confusion.  Optimize nonopiate pain relief.  Encourage pulmonary toilet.  Genitourinary CKD (chronic kidney disease) stage 3, GFR 30-59 ml/min (HCC) Assessment &  Plan On examination: no significant edema. No JVD, mucous membranes are dry.  BMP Latest Ref Rng & Units 12/25/2017 12/23/2017  Glucose 70 - 99 mg/dL 174(H) 97  BUN 8 - 23 mg/dL 27(H) 24(H)  Creatinine 0.61 - 1.24 mg/dL 1.80(H) 1.65(H)  Sodium 135 - 145 mmol/L 139 140  Potassium 3.5 - 5.1 mmol/L 3.4(L) 4.5  Chloride 98 - 111 mmol/L 105 110  CO2 22 - 32 mmol/L 17(L) 22  Calcium 8.9 - 10.3 mg/dL 8.8(L) 8.6(L)   Assessment and plan: Mild increased creatinine.  Appears volume contracted.  Continue normal saline 75 mL an hour until able to take orals.  Other Seizure [facility]) Assessment & Plan Patient has no prior history of seizure.  Brought to ICU due to episode of tonic movements followed by unresponsiveness.  Neurology is following and is fairly convinced of the diagnosis of seizure.  He has had no further seizure activity.  On examination: Patient is awake and alert.  There are no focal neurological deficits.  He is able to move all limbs spontaneously.  Has more spontaneous movement that he did initially following the event.  CT scan of head (reviewed by myself) shows no acute process.  Chronic microvascular disease.  Assessment and plan: Seizure in the context of dementia and acute hospitalization.  Neurology is following.  Continue valproate as ordered.  Had been on gabapentin at home, withdrawal from this may have provoked a seizure. EEG.  Consider MRI if patient cooperative.  Status post closed reduction of dislocated total hip prosthesis Assessment & Plan With fall at home due to dislocation of hip prosthesis.  Underwent reduction of hip dislocation.  On examination: Left hip is in an external brace.  Groin ecchymoses.  Tender to palpation.  Assessment and plan: Optimize non-opiate analgesia for hip pain.  Further management as per orthopedics.  Kipp Brood, MD [facility] ICU Physician [place]  Pager: [phone] Mobile: [phone] After hours:  [phone].

## 2017-12-25 NOTE — Assessment & Plan Note (Signed)
Patient has no prior history of seizure.  Brought to ICU due to episode of tonic movements followed by unresponsiveness.  Neurology is following and is fairly convinced of the diagnosis of seizure.  He has had no further seizure activity.  On examination: Patient is awake and alert.  There are no focal neurological deficits.  He is able to move all limbs spontaneously.  Has more spontaneous movement that he did initially following the event.  CT scan of head (reviewed by myself) shows no acute process.  Chronic microvascular disease.  Assessment and plan: Seizure in the context of dementia and acute hospitalization.  Neurology is following.  Continue valproate as ordered.  Had been on gabapentin at home, withdrawal from this may have provoked a seizure. EEG.  Consider MRI if patient cooperative.

## 2017-12-25 NOTE — Progress Notes (Addendum)
Same-day progress note.  Patient more awake, attempting to follow some commands. He is moving his left side much more briskly and nearly symmetrically to the right side.  Impression: Toxic metabolic  enceph Evaluate for stroke Possible new onset seizure  Recommendations -Ordered a MRI to be done faster than a routine.  MRI and patient's RN aware. -Continue with antiepileptics as recommended by Dr. Cheral Marker -Routine EEG  We will follow with you.  -- Amie Portland, MD [facility] Pager: [phone] If 7pm to 7am, please call on call as listed on AMION.  ADDENDUM Med list reviewed.  Has been at home on Neurontin 1200 BID. Potentially abrupt discontinuation of neurontin, in the setting of acute illness could have contributed to the seizure activity. Stroke as an etiology is still on differentials. MRI pending. EEG result pending. We will follow  -- Amie Portland, MD [facility] Pager: [phone] If 7pm to 7am, please call on call as listed on AMION.

## 2017-12-25 NOTE — Progress Notes (Signed)
Patient arrived from [place] at 0400. Vitals stable, currently in A.Fib and on 4L [place]. Patient will arouse to voice and squeeze hands on commands (will not follow any other commands). Patient remains lethargic and confused. Will continue to monitor for remainder of shift.

## 2017-12-25 NOTE — Progress Notes (Signed)
CRITICAL VALUE ALERT  Critical Value:  Troponin 0.41  Date & Time Notied:  12/25/2017  14:10   Provider Notified: Lynetta Mare, MD   Orders Received/Actions taken: No new orders at this time.   Lucius Conn, RN

## 2017-12-25 NOTE — Procedures (Signed)
Date of recording 12/25/2017  Referring physician  [place]  Reason for the study Seizure  Technical Digital EEG recording using 10-20 international electrode system  Description of the recording Most of the recording was during drowsiness, when awake posterior dominant rhythm is 6 to 8 Hz bilateral and reactive. Frequent triphasic waves with frontal predominance Sleep architecture was not seen Epileptiform features were not seen.  Impression The EEG is abnormal and findings are consistent with Mild to moderate generalized cerebral dysfunction/metabolic encephalopathy Epileptiform features were not seen during this recording

## 2017-12-25 NOTE — Progress Notes (Signed)
RN and tech had just repositioned pt in room 10 min prior to episode. Pt was alert to self and communicating to nurse and tech. RN in hall and heard a yelp from the room. RN and tech ran into room and pt was seen with seizure like activity with rt hand contracted and eyes rolling back of his head. Called pt by name multiple times and no response. No palpable pulse at the time. RN started CPR at 0205 am and called a code and rapid response called. Rapid response, respiratory, MD arrived at 0208. Palpable Pulse returned with shallow breathing. Family called and NP [place] lvm for RTC @ 0225 am. Called report to [place] at 0240 am. Pt was transferred to CT by nurse and rapid response and then to [place] via bed and telemetry monitoring. Pt was in stable condition when transferred to [place].

## 2017-12-25 NOTE — Progress Notes (Addendum)
EEG with slowing only. No seizures or NCSE.  Recs as before.  -- Amie Portland, MD [facility] Pager: [phone] If 7pm to 7am, please call on call as listed on AMION.

## 2017-12-25 NOTE — Anesthesia Postprocedure Evaluation (Signed)
Anesthesia Post Note  Patient: Thomas Hooper.  Procedure(s) Performed: CLOSED REDUCTION HIP (Left )     Patient location during evaluation: PACU Anesthesia Type: General Level of consciousness: awake and alert Pain management: pain level controlled Vital Signs Assessment: post-procedure vital signs reviewed and stable Respiratory status: spontaneous breathing, nonlabored ventilation, respiratory function stable and patient connected to nasal cannula oxygen Cardiovascular status: blood pressure returned to baseline and stable Postop Assessment: no apparent nausea or vomiting Anesthetic complications: no    Last Vitals:  Vitals:   12/25/17 0830 12/25/17 0900  BP: (!) 124/94 (!) 144/86  Pulse: 98 79  Resp: (!) 21 13  Temp:    SpO2: 100% 100%    Last Pain:  Vitals:   12/25/17 0755  TempSrc: Oral  PainSc:                  [place]

## 2017-12-25 NOTE — Assessment & Plan Note (Signed)
Known chronic dementia generally well compensated at home but very sensitive to any medication changes.  On examination: Patient is alert and awake and orients to voice.  Will to follow simple commands but is not speaking clearly.  He appears in pain.  Tender to palpation of right chest.  Assessment and plan: Acute delirium on the background of dementia.  Driven by recent postictal state change of environment and pain. Minimize psychotropic medications.  Maximize non-opioid medications to control pain.

## 2017-12-25 NOTE — Consult Note (Addendum)
Name: Deatra James. MRN: [id number] DOB: 16-Sep-1924    ADMISSION DATE:  12/23/2017 CONSULTATION DATE:  12/25/2017  REFERRING MD :  Dr. Alfonse Spruce   CHIEF COMPLAINT:  Encephalopathy   HISTORY OF PRESENT ILLNESS:   84 year old male with PMH of A.Fib, CKD stage 3, Dementia, HTN, Systolic HF  Admitted to [facility] for left hip dislocation. After several attempts to put hip back into place patient was taken to OR 7/18 in which was also not successful. Transferred to [facility] and taken to OR on 7/19 for closed reduction.   On 7/21 patient found unresponsive with right gaze deviation and right arm flexion, unsure if pulse felt by bedside staff so compression started, no medications given due to lack of IV. When Rapid arrived to bedside Systolic 270 and patient with bounding pulse. Question concerning seizure activity by bedside nurse. Patient transferred to ICU. Neurology consulted.   SIGNIFICANT EVENTS  7/19 > To [facility] > OR for Reduction   STUDIES:  CXR 7/21 >> CT Head 7/21 >>   PAST MEDICAL HISTORY :   has a past medical history of Atrial fibrillation ([place]), Chronic kidney disease, Dementia, Hypertension, Neuropathy, Thyroid disease, and TIA (transient ischemic attack).  has a past surgical history that includes Cholecystectomy; Hip surgery; Knee surgery; Back surgery; Shoulder surgery; and LOOP RECORDER INSERTION (N/A, 05/10/2017). Prior to Admission medications   Medication Sig Start Date End Date Taking? Authorizing Provider  allopurinol (ZYLOPRIM) 100 MG tablet Take 100 mg by mouth daily.  04/28/16  Yes [provider]  aspirin EC 81 MG tablet Take 81 mg by mouth daily.   Yes [provider]  atorvastatin (LIPITOR) 10 MG tablet Take 10 mg by mouth daily.   Yes [provider]  Calcium Carb-Cholecalciferol (CALCIUM 600/VITAMIN D3 PO) Take 1 tablet by mouth daily.   Yes [provider]  cyanocobalamin 1000 MCG tablet Take 1,000 mcg by mouth  daily.   Yes [provider]  FEROSUL 325 (65 Fe) MG tablet Take 325 mg by mouth every morning. 11/21/17  Yes [provider]  gabapentin (NEURONTIN) 600 MG tablet Take 600 mg by mouth 4 (four) times daily.   Yes [provider]  ibuprofen (ADVIL,MOTRIN) 200 MG tablet Take 800 mg by mouth daily.    Yes [provider]  levothyroxine (SYNTHROID, LEVOTHROID) 25 MCG tablet Take 25 mcg by mouth daily before breakfast.  04/21/16  Yes [provider]  metoprolol tartrate (LOPRESSOR) 25 MG tablet Take 12.5 mg by mouth 2 (two) times daily.   Yes [provider]  Omega-3 Fatty Acids (FISH OIL) 1000 MG CAPS Take 1,000 mg by mouth daily.   Yes [provider]  sertraline (ZOLOFT) 25 MG tablet Take 25 mg by mouth at bedtime.    Yes [provider]   Allergies  Allergen Reactions  . Prednisone Other (See Comments)    Causes Afib  . Morphine And Related Other (See Comments)    Agitation, "made him mean" per granddaughter  . Penicillins Other (See Comments)    Has patient had a PCN reaction causing immediate rash, facial/tongue/throat swelling, SOB or lightheadedness with hypotension: Unknown Has patient had a PCN reaction causing severe rash involving mucus membranes or skin necrosis: Unknown Has patient had a PCN reaction that required hospitalization: Unknown Has patient had a PCN reaction occurring within the last 10 years: Unknown If all of the above answers are "NO", then may proceed with Cephalosporin use.  FAMILY HISTORY:  family history includes CAD in his brother; Cancer in his father. SOCIAL HISTORY:  reports that he has never smoked. He has never used smokeless tobacco. He reports that he does not drink alcohol or use drugs.  REVIEW OF SYSTEMS:   Unable tor review as patient is encephalopathic   SUBJECTIVE:   VITAL SIGNS: Temp:  [98.5 F (36.9 C)] 98.5 F (36.9 C) (07/20 1440) Pulse Rate:  [77] 77 (07/20  1440) Resp:  [16] 16 (07/20 1440) BP: (159-178)/(84-92) 178/92 (07/20 2022) SpO2:  [99 %] 99 % (07/20 1440) FiO2 (%):  [100 %] 100 % (07/21 0215)  PHYSICAL EXAMINATION: General:  Elderly male, no distress  Neuro:  Does not respond to verbal stimulation, localizes to pain, does not track   HEENT:  Dry MM  Cardiovascular:  Irregular, no MRG  Lungs:  Clear breath sounds, no wheeze, decreased to bases  Abdomen:  Non-distended, active bowels sounds  Musculoskeletal:  -edema  Skin:  Warm, dry   Recent Labs  Lab 12/23/17 1451  NA 140  K 4.5  CL 110  CO2 22  BUN 24*  CREATININE 1.65*  GLUCOSE 97   Recent Labs  Lab 12/23/17 1451 12/25/17 0215  HGB 11.2* 12.4*  HCT 34.8* 39.9  WBC 10.3 15.5*  PLT 85* 100*   Dg Pelvis Portable  Result Date: 12/23/2017 CLINICAL DATA:  84 year old male status post closed reduction of dislocated left total hip arthroplasty. EXAM: PORTABLE PELVIS 1-2 VIEWS COMPARISON:  [facility] left hip radiographs 12/22/2017. Intraoperative image 16 21 hours today. FINDINGS: Portable AP supine view at 1644 hours. Bilateral total hip arthroplasties appear normally aligned on these AP images. Hardware appears intact. No acute osseous abnormality identified. Aortoiliac and bilateral femoral artery calcified atherosclerosis. Negative visible bowel gas pattern. IMPRESSION: Bilateral total hip arthroplasties with no adverse features. Electronically Signed   By: Genevie Ann M.D.   On: 12/23/2017 16:58   Dg Hip Operative Unilat W Or W/o Pelvis Left  Result Date: 12/23/2017 CLINICAL DATA:  Closed reduction of the left hip EXAM: OPERATIVE LEFT HIP (WITH PELVIS IF PERFORMED) 1 VIEWS TECHNIQUE: Fluoroscopic spot image(s) were submitted for interpretation post-operatively. COMPARISON:  None. FINDINGS: A single spot AP radiograph of the left hip is provided for review and demonstrates a left total hip replacement prosthesis. Alignment appears anatomic given solitary AP projection.  No definite fracture. Limited visualization of the pelvis demonstrates sequela of contralateral right total hip replacement, incompletely evaluated. Degenerative change of the lower lumbar spine is suspected though incompletely evaluated. Vascular calcifications overlie the pelvis and left thigh. No radiopaque foreign body. IMPRESSION: Post reduction of left total hip replacement without evidence of complication. Electronically Signed   By: Sandi Mariscal M.D.   On: 12/23/2017 16:35    ASSESSMENT / PLAN:  Acute Encephalopathy in setting of presumed post-ictal state  H/O Dementia, TIA/CVA Plan  -Frequent Neuro Exam  -Neurology Consulted -Seizure Precautions  -Planned EEG in AM -CT Head Pending  -Load Depakote and Schedule q8h   Questionable Arrest > Clinically appears to have had a seizure, bedside staff reportedly couldn't find a pulse however received no medications and when Rapid arrived systolic 829 with bounding pulse  Prolonged QTC  -This AM 509, Previously 440 H/O A.Fib RVR, Systolic HF (EF 93-71), HTN  Plan  -Cardiac Monitoring  -ECHO pending  -Trend Troponin  -Maintain Systolic <696   Anion Gap Metabolic Acidosis  CKD Stage 3 (Basline Crt 1.5-2.2)  Hypokalemia  Plan -Trend  BMP -Replace electrolytes as indicated  -LA Pending  -NS @ 75 ml/hr   Leukocytosis (Most Likely Reactive Post Surgery) -Afebrile  Plan  -Trend WBC and Fever Curve -Trend PCT and LA   Left Hip Dislocation s/p Closed reduction  Plan  -Per Ortho   Attempted to call family multiple times with no answer.   Hayden Pedro, AGACNP-BC [facility]  Pgr: [phone]  PCCM Pgr: [phone]

## 2017-12-25 NOTE — Code Documentation (Signed)
  Patient Name: Thomas Hooper.   MRN: [id number]   Date of Birth/ Sex: 04-Jul-1924 , male      Admission Date: 12/23/2017  Attending Provider: Benito Mccreedy, MD  Primary Diagnosis: <principal problem not specified>   Indication: Pt was in his usual state of health until this PM, when he was noted to be PEA arrest. Code blue was subsequently called. At the time of arrival on scene, ACLS protocol was underway.   Technical Description:  - CPR performance duration:  5 minutes  - Was defibrillation or cardioversion used? No   - Was external pacer placed? No  - Was patient intubated pre/post CPR? No   Medications Administered: Y = Yes; Blank = No Amiodarone    Atropine    Calcium    Epinephrine    Lidocaine    Magnesium    Norepinephrine    Phenylephrine    Sodium bicarbonate    Vasopressin     Post CPR evaluation:  - Final Status - Was patient successfully resuscitated ? Yes - What is current rhythm? Atrial fibrillation with RVR - What is current hemodynamic status? yes  Miscellaneous Information:  - Labs sent, including: ABG, CBC, CMP, Lactic acid, Magnesium, Phosphorous  - Primary team notified?  Yes  - Family Notified? Yes  - Additional notes/ transfer status: N/A     Carroll Sage, MD  12/25/2017, 2:31 AM

## 2017-12-25 NOTE — Progress Notes (Addendum)
Subjective: 2 Days Post-Op Procedure(s) (LRB): CLOSED REDUCTION HIP (Left)  84 y/o male with PMH of dementia, Afib, CKD and left PP hip dislocation is now two days s/p closed reduction of the left hip dislocation under anesthesia.  Early this morning he apparently had a seizure and was transferred to ICU under care of CCM.  Per RN he is awake but not following commands.  He will variably answer questions.  Objective: Vital signs in last 24 hours: Temp:  [97.6 F (36.4 C)-98.7 F (37.1 C)] 97.6 F (36.4 C) (07/21 0755) Pulse Rate:  [64-111] 98 (07/21 0830) Resp:  [16-28] 21 (07/21 0830) BP: (116-195)/(55-97) 124/94 (07/21 0830) SpO2:  [93 %-100 %] 100 % (07/21 0830) FiO2 (%):  [100 %] 100 % (07/21 0215) Weight:  [67 kg (147 lb 11.3 oz)-68.6 kg (151 lb 3.8 oz)] 67 kg (147 lb 11.3 oz) (07/21 0630)  Intake/Output from previous day: 07/20 0701 - 07/21 0700 In: 1025 [P.O.:500; I.V.:136.2; IV Piggyback:138.8] Out: 500 [Urine:500] Intake/Output this shift: No intake/output data recorded.  Recent Labs    12/23/17 1451 12/25/17 0215  HGB 11.2* 12.4*   Recent Labs    12/23/17 1451 12/25/17 0215  WBC 10.3 15.5*  RBC 3.25* 3.61*  HCT 34.8* 39.9  PLT 85* 100*   Recent Labs    12/23/17 1451 12/25/17 0215  NA 140 139  K 4.5 3.4*  CL 110 105  CO2 22 17*  BUN 24* 27*  CREATININE 1.65* 1.80*  GLUCOSE 97 174*  CALCIUM 8.6* 8.8*   No results for input(s): LABPT, INR in the last 72 hours.  PE:  Elderly male resting comfortably in bed.  Awakens easily.  Appears alert but not oriented.  Doesn't follow commands.  Spontaneous motion observed in all 4 extremities.  Leg lengths equal.  Log roll of L LE doesn't elicit pain.  1+ dp pulses on the L.  Skin at the left hip intact with some mild anterior ecchymosis c/w previous dislocation.  Strength can't be assessed on the L.  Abduction orthosis in place and fitting appropriately.    Assessment/Plan: 2 Days Post-Op Procedure(s)  (LRB): CLOSED REDUCTION HIP (Left)  Appreciate management by CCM and neurology.  Please continue the hip abduction brace.  No evidence of recurrent dislocation on PE but we'll check an AP pelvis xray to make sure.  We'll continue to follow.    HEWITTJenny Reichmann 12/25/2017, 8:48 AM   AP pelvis xray shows that the left hip is appropriately aligned.  No dislocation evident.

## 2017-12-25 NOTE — Progress Notes (Signed)
CRITICAL VALUE ALERT  Critical Value:  Lactic Acid 2.0, Critical Troponin 0.38  Date & Time Notied:  12/25/2017 09:30 Face to Face.  Provider Notified: Kipp Brood, MD  Orders Received/Actions taken: No new orders at this time.   Lucius Conn, RN

## 2017-12-25 NOTE — Assessment & Plan Note (Signed)
Patient complaining of right chest pain.  Received brief CPR during moment of unresponsiveness.  On examination: Exquisitely tender to palpation of right chest with subcutaneous emphysema consistent with rib fracture.  Patient is saturating 100% on 2 L nasal cannula.  Chest is otherwise clear to auscultation bilaterally.  Chest x-ray interpreted by myself shows possible right third rib fracture.  Nondisplaced.  Assessment and plan: Likely rib fractures due to CPR.  Pain is likely driving confusion.  Optimize nonopiate pain relief.  Encourage pulmonary toilet.

## 2017-12-25 NOTE — Assessment & Plan Note (Signed)
With fall at home due to dislocation of hip prosthesis.  Underwent reduction of hip dislocation.  On examination: Left hip is in an external brace.  Groin ecchymoses.  Tender to palpation.  Assessment and plan: Optimize non-opiate analgesia for hip pain.  Further management as per orthopedics.

## 2017-12-25 NOTE — Assessment & Plan Note (Addendum)
On examination: no significant edema. No JVD, mucous membranes are dry.  BMP Latest Ref Rng & Units 12/25/2017 12/23/2017  Glucose 70 - 99 mg/dL 174(H) 97  BUN 8 - 23 mg/dL 27(H) 24(H)  Creatinine 0.61 - 1.24 mg/dL 1.80(H) 1.65(H)  Sodium 135 - 145 mmol/L 139 140  Potassium 3.5 - 5.1 mmol/L 3.4(L) 4.5  Chloride 98 - 111 mmol/L 105 110  CO2 22 - 32 mmol/L 17(L) 22  Calcium 8.9 - 10.3 mg/dL 8.8(L) 8.6(L)   Assessment and plan: Mild increased creatinine.  Appears volume contracted.  Continue normal saline 75 mL an hour until able to take orals.

## 2017-12-25 NOTE — Progress Notes (Signed)
EEG complete - results pending 

## 2017-12-26 ENCOUNTER — Inpatient Hospital Stay (HOSPITAL_COMMUNITY): Payer: Medicare Other

## 2017-12-26 ENCOUNTER — Encounter (HOSPITAL_COMMUNITY): Payer: Self-pay | Admitting: Orthopedic Surgery

## 2017-12-26 DIAGNOSIS — G459 Transient cerebral ischemic attack, unspecified: Secondary | ICD-10-CM

## 2017-12-26 DIAGNOSIS — G4089 Other seizures: Secondary | ICD-10-CM

## 2017-12-26 LAB — GLUCOSE, CAPILLARY
GLUCOSE-CAPILLARY: 82 mg/dL (ref 70–99)
GLUCOSE-CAPILLARY: 73 mg/dL (ref 70–99)
Glucose-Capillary: 73 mg/dL (ref 70–99)
Glucose-Capillary: 78 mg/dL (ref 70–99)
Glucose-Capillary: 78 mg/dL (ref 70–99)
Glucose-Capillary: 79 mg/dL (ref 70–99)
Glucose-Capillary: 91 mg/dL (ref 70–99)

## 2017-12-26 LAB — ECHOCARDIOGRAM COMPLETE
Height: 70 in
Weight: 2398.6 oz

## 2017-12-26 MED ORDER — PERFLUTREN LIPID MICROSPHERE
INTRAVENOUS | Status: AC
  Administered 2017-12-26: 2 mL via INTRAVENOUS
  Filled 2017-12-26: qty 10

## 2017-12-26 MED ORDER — HYDROCODONE-ACETAMINOPHEN 5-325 MG PO TABS
0.5000 | ORAL_TABLET | ORAL | Status: DC | PRN
  Administered 2017-12-27: 0.5 via ORAL
  Filled 2017-12-26: qty 1

## 2017-12-26 MED ORDER — GABAPENTIN 600 MG PO TABS
600.0000 mg | ORAL_TABLET | Freq: Four times a day (QID) | ORAL | Status: DC
  Administered 2017-12-26 – 2017-12-30 (×13): 600 mg via ORAL
  Filled 2017-12-26 (×14): qty 1

## 2017-12-26 MED ORDER — PERFLUTREN LIPID MICROSPHERE
1.0000 mL | INTRAVENOUS | Status: AC | PRN
  Administered 2017-12-26: 2 mL via INTRAVENOUS
  Filled 2017-12-26: qty 10

## 2017-12-26 MED FILL — Medication: Qty: 1 | Status: AC

## 2017-12-26 NOTE — Progress Notes (Addendum)
Critical care progress note  Reason for ICU admission: Status post witnessed seizure.  History of present illness:  Patient is 84 years old and transferred on 7/17 for severe left hip pain secondary to prosthetic hip dislocation.  Went successful reduction under anesthesia on 7/19.  He was recovering well and undergoing physical therapy and had been previously fully conversant.  He had an unheralded episode of arm flexion eyes rolling unresponsiveness teeth clenching and foaming at the mouth.  Episode ended and was accompanied then by unresponsiveness.  A brief cardiac arrest code was called.  Was found to be hypertensive.  Seen by neurology who felt that this represented a seizure with subsequent Todd's paralysis.  7/22 The patient is alert. He is not speaking presently. He will follow simple commands. He has a fair amount of pain coming from his hip He is in a normal sinus rhythm Has some other pain relate dto possible rib fravcture left from CPR. He appears able to take clear liquids and pills. He stood with PT today earlier.   Temp:  [97.7 F (36.5 C)-98.4 F (36.9 C)] 97.9 F (36.6 C) (07/22 0856) Pulse Rate:  [41-103] 83 (07/22 1000) Resp:  [8-25] 12 (07/22 1000) BP: (110-151)/(71-104) 135/90 (07/22 1000) SpO2:  [95 %-100 %] 100 % (07/22 1000) Weight:  [149 lb 14.6 oz (68 kg)] 149 lb 14.6 oz (68 kg) (07/22 0500) Physical Exam  Constitutional: He is well-developed, well-nourished, and in no distress.  HENT:  Head: Normocephalic and atraumatic.  Eyes: Pupils are equal, round, and reactive to light. Conjunctivae are normal.  Neck: Normal range of motion. Neck supple. No tracheal deviation present. No thyromegaly present.  Cardiovascular: Normal rate and normal heart sounds.  No murmur heard. Pulmonary/Chest: Effort normal. No respiratory distress.  Abdominal: Soft. Bowel sounds are normal.  Musculoskeletal: Normal range of motion. He exhibits tenderness (left hip  tenderness).  Neurological: He is alert.  The patient appears alert but not speaking presenrlt. His eyes are open and he appears to appreciate his surroundings  Skin: Skin is warm and dry.   Labs:: CBC Latest Ref Rng & Units 12/25/2017 12/23/2017  WBC 4.0 - 10.5 K/uL 15.5(H) 10.3  Hemoglobin 13.0 - 17.0 g/dL 12.4(L) 11.2(L)  Hematocrit 39.0 - 52.0 % 39.9 34.8(L)  Platelets 150 - 400 K/uL 100(L) 85(L)   BMP Latest Ref Rng & Units 12/25/2017 12/25/2017 12/23/2017  Glucose 70 - 99 mg/dL 108(H) 174(H) 97  BUN 8 - 23 mg/dL 31(H) 27(H) 24(H)  Creatinine 0.61 - 1.24 mg/dL 1.64(H) 1.80(H) 1.65(H)  Sodium 135 - 145 mmol/L 137 139 140  Potassium 3.5 - 5.1 mmol/L 4.4 3.4(L) 4.5  Chloride 98 - 111 mmol/L 107 105 110  CO2 22 - 32 mmol/L 21(L) 17(L) 22  Calcium 8.9 - 10.3 mg/dL 8.3(L) 8.8(L) 8.6(L)     Scheduled Meds: . atorvastatin  10 mg Oral q1800  . chlorhexidine  15 mL Mouth Rinse BID  . docusate sodium  100 mg Oral BID  . enoxaparin (LOVENOX) injection  30 mg Subcutaneous Q24H  . gabapentin  600 mg Oral QID  . levothyroxine  25 mcg Oral QAC breakfast  . lidocaine  1 patch Transdermal Q24H  . mouth rinse  15 mL Mouth Rinse q12n4p  . metoprolol tartrate  2.5 mg Intravenous Q6H  . perflutren lipid microspheres (DEFINITY) IV suspension      . sertraline  25 mg Oral QHS   Continuous Infusions: . sodium chloride    . sodium chloride  75 mL/hr at 12/26/17 1000   PRN Meds:sodium chloride, HYDROcodone-acetaminophen, HYDROmorphone (DILAUDID) injection, ondansetron **OR** ondansetron (ZOFRAN) IV    I/O last 3 completed shifts: In: 2818.6 [P.O.:140; I.V.:1603.9; Other:250; IV Piggyback:824.8] Out: 675 [Urine:675] Intake/Output this shift: Total I/O In: 222.9 [I.V.:218.1; IV Piggyback:4.9] Out: -   Problem Assessment/Plan::  S/p left hip dislocation. S/P repair and patient getting PT presently. I have written for 1/2 tablet of norco q 4h for moderate pain.  Seizure No further events.  EEG negative. Patient back on his home gabpentin.      Micheal Likens MD [place] Cell [phone]

## 2017-12-26 NOTE — Plan of Care (Signed)
  Problem: Clinical Measurements: Goal: Respiratory complications will improve Outcome: Progressing Goal: Cardiovascular complication will be avoided Outcome: Progressing   Problem: Elimination: Goal: Will not experience complications related to urinary retention Outcome: Progressing   Problem: Pain Managment: Goal: General experience of comfort will improve Outcome: Progressing   Problem: Safety: Goal: Ability to remain free from injury will improve Outcome: Progressing   Problem: Skin Integrity: Goal: Risk for impaired skin integrity will decrease Outcome: Progressing   

## 2017-12-26 NOTE — Progress Notes (Signed)
RN transported pt to MRI w/ assistance from transportation. Pt was not able to tolerate transfer/lying flat on MRI table. Pt yelled out during the transfer from bed to table and reported that he was in "a lot" of pain. Pt continued to make guarded movements while on the table and continued to yell at staff about his discomfort. Pt SPO2 levels dropped to mid-to-high 80s with questionable waveforms (Pt was "tensing up"). Pt was transferred back to bed and appeared to be in less discomfort w/ SPO2 levels in the high 90s to 100%.  Pt was transported back to [place] w/o incident. Pt vital signs stable. RN will continue to monitor.

## 2017-12-26 NOTE — Progress Notes (Signed)
  Echocardiogram 2D Echocardiogram has been performed.  Merrie Roof F 12/26/2017, 11:36 AM

## 2017-12-26 NOTE — NC FL2 (Signed)
[place] LEVEL OF CARE SCREENING TOOL     IDENTIFICATION  Patient Name: Thomas Hooper. Birthdate: [birth year] Sex: male Admission Date (Current Location): 12/23/2017  [facility] and [place] Number:  Herbalist and Address:  [facility], [place] [address], [place], [place] [address]      Provider Number: [id number]  Attending Physician Name and Address:  Kandice Hams, MD  Relative Name and Phone Number:       Current Level of Care: Hospital Recommended Level of Care: [place] Prior Approval Number:    Date Approved/Denied:   PASRR Number: [id number]  Discharge Plan: SNF    Current Diagnoses: Patient Active Problem List   Diagnosis Date Noted  . Rib fractures 12/25/2017  . Seizure ([place])   . Status post closed reduction of dislocated total hip prosthesis 12/23/2017  . Chronic anticoagulation 02/28/2017  . Dementia 02/28/2017  . Cardiomyopathy ([place]) 02/28/2017  . TIA (transient ischemic attack) 02/28/2017  . CKD (chronic kidney disease) stage 3, GFR 30-59 ml/min (HCC) 02/24/2017  . Gout 02/24/2017  . Hypertensive heart and kidney disease 05/19/2016  . Hypothyroidism (acquired) 05/19/2016  . Paroxysmal atrial fibrillation (HCC) 05/19/2016    Orientation RESPIRATION BLADDER Height & Weight     Self  Normal Continent Weight: 149 lb 14.6 oz (68 kg) Height:  5\' 10"  (177.8 cm)  BEHAVIORAL SYMPTOMS/MOOD NEUROLOGICAL BOWEL NUTRITION STATUS      Continent Diet(Heart healthy, thin liquids)  AMBULATORY STATUS COMMUNICATION OF NEEDS Skin   Extensive Assist Verbally Normal                       Personal Care Assistance Level of Assistance  Bathing, Feeding, Dressing Bathing Assistance: Maximum assistance Feeding assistance: Limited assistance Dressing Assistance: Maximum assistance     Functional Limitations Info  Sight, Hearing, Speech Sight Info: Adequate Hearing Info: Adequate Speech Info:  Adequate    SPECIAL CARE FACTORS FREQUENCY  PT (By licensed PT), OT (By licensed OT)     PT Frequency: 5/wk OT Frequency: 5/wk            Contractures Contractures Info: Not present    Additional Factors Info  Code Status, Allergies Code Status Info: Full Code Allergies Info: Prednisone, Morphine And Related, Penicillins           Current Medications (12/26/2017):  This is the current hospital active medication list Current Facility-Administered Medications  Medication Dose Route Frequency Provider Last Rate Last Dose  . 0.9 %  sodium chloride infusion  250 mL Intravenous PRN Omar Person, NP      . 0.9 %  sodium chloride infusion   Intravenous Continuous Omar Person, NP 75 mL/hr at 12/26/17 0800    . atorvastatin (LIPITOR) tablet 10 mg  10 mg Oral q1800 Caren Griffins, MD   10 mg at 12/24/17 2042  . chlorhexidine (PERIDEX) 0.12 % solution 15 mL  15 mL Mouth Rinse BID Scatliffe, Rise Paganini, MD   15 mL at 12/25/17 0906  . docusate sodium (COLACE) capsule 100 mg  100 mg Oral BID Rod Can, MD   100 mg at 12/24/17 2039  . enoxaparin (LOVENOX) injection 30 mg  30 mg Subcutaneous Q24H Rod Can, MD   30 mg at 12/26/17 0801  . HYDROmorphone (DILAUDID) injection 0.25 mg  0.25 mg Intravenous Q4H PRN Kipp Brood, MD   0.25 mg at 12/26/17 0803  . levothyroxine (SYNTHROID, LEVOTHROID) tablet  25 mcg  25 mcg Oral QAC breakfast Caren Griffins, MD   25 mcg at 12/26/17 0747  . lidocaine (LIDODERM) 5 % 1 patch  1 patch Transdermal Q24H Kipp Brood, MD   1 patch at 12/25/17 1116  . lidocaine (LIDODERM) 5 % 1 patch  1 patch Transdermal Q24H Scatliffe, Rise Paganini, MD   1 patch at 12/25/17 1116  . MEDLINE mouth rinse  15 mL Mouth Rinse q12n4p Scatliffe, Kristen D, MD   15 mL at 12/25/17 1125  . metoprolol tartrate (LOPRESSOR) injection 2.5 mg  2.5 mg Intravenous Q6H Agarwala, Einar Grad, MD   2.5 mg at 12/26/17 0549  . ondansetron (ZOFRAN) tablet 4 mg  4 mg Oral Q6H  PRN Swinteck, Aaron Edelman, MD       Or  . ondansetron (ZOFRAN) injection 4 mg  4 mg Intravenous Q6H PRN Swinteck, Aaron Edelman, MD      . sertraline (ZOLOFT) tablet 25 mg  25 mg Oral QHS Caren Griffins, MD   25 mg at 12/24/17 2035  . valproate (DEPACON) 250 mg in dextrose 5 % 50 mL IVPB  250 mg Intravenous Q6H Omar Person, NP   Stopped at 12/26/17 0092     Discharge Medications: Please see discharge summary for a list of discharge medications.  Relevant Imaging Results:  Relevant Lab Results:   Additional Information SSN; [id number]  Jorge Ny, LCSW

## 2017-12-26 NOTE — Progress Notes (Signed)
Physical Therapy Treatment Patient Details Name: Thomas Hooper. MRN: [id number] DOB: [birth year] Today's Date: 12/26/2017    History of Present Illness Pt is a 84 y.o. male with medical history significant of HTN, systolic CHF, chronic kidney disease stage III, mild dementia, s/p B THA, and s/p B TKA.  He was bending over in his garden when he had immediate left hip pain and fell.  He was taken to [facility] and found to have a dislocated L hip. Attempts at closed reduction failed at [facility]. Pt was, therefore, transfered to [facility] and underwent successful closed reduction in the OR 12-23-17.     PT Comments    Pt greatly limited by L hip pain and the combativeness/agitation that comes with it. Pt did tolerate sitting EOB and 2 standing trials well however did not tolerate returning to supine. Due to confusion and dementia pt with no orientation or recall of fall/dislocated hip. Pt con't to require 2 people to assist for safe transfers. Acute PT to con't to follow.   Follow Up Recommendations  SNF     Equipment Recommendations  Other (comment)(TBD at next venue)    Recommendations for Other Services       Precautions / Restrictions Precautions Precautions: Fall;Posterior Hip Precaution Booklet Issued: No Precaution Comments: pt with no comprehension Required Braces or Orthoses: Other Brace/Splint Other Brace/Splint: L hip abduction brace at all times except for hygiene.  Restrictions Weight Bearing Restrictions: Yes LLE Weight Bearing: Weight bearing as tolerated    Mobility  Bed Mobility Overal bed mobility: Needs Assistance Bed Mobility: Supine to Sit;Sit to Supine     Supine to sit: HOB elevated;Max assist;+2 for physical assistance Sit to supine: HOB elevated;Max assist;+2 for physical assistance   General bed mobility comments: pt tolerated transfer to EOB well with use of bed pad and someone holding onto L LE however pt with increased L hip pain and  agitiation/irritation requiring maxAx2 against resistance to return to supine, pt more comfortable once in supine and less agitated  Transfers Overall transfer level: Needs assistance Equipment used: (2 person lift with gait belt ) Transfers: Sit to/from Stand Sit to Stand: Mod assist;+2 physical assistance         General transfer comment: complete 2 sit to stand trials with modAx2, pt unable to sequence stepping, did slide feet up towards head of the bed x 1 foot with maxA to maintain standing  Ambulation/Gait             General Gait Details: unable at this time   Stairs             Wheelchair Mobility    Modified Rankin (Stroke Patients Only)       Balance Overall balance assessment: Needs assistance Sitting-balance support: Bilateral upper extremity supported Sitting balance-Leahy Scale: Fair     Standing balance support: During functional activity Standing balance-Leahy Scale: Zero Standing balance comment: reliant on external support of +2 assist                            Cognition Arousal/Alertness: Awake/alert Behavior During Therapy: Flat affect(can become irritated with pain) Overall Cognitive Status: Impaired/Different from baseline Area of Impairment: Orientation;Attention;Memory;Following commands;Safety/judgement;Problem solving;Awareness                 Orientation Level: Disoriented to;Place;Time;Situation Current Attention Level: Sustained Memory: Decreased short-term memory Following Commands: Follows one step commands with increased time Safety/Judgement: Decreased awareness of safety;Decreased  awareness of deficits Awareness: Intellectual Problem Solving: Slow processing;Difficulty sequencing;Requires verbal cues;Requires tactile cues General Comments: pt pleasant until onset of pain in L hip and then patient transitions to agitation, irritation, and threatens to hit someone. Pt      Exercises General Exercises  - Lower Extremity Ankle Circles/Pumps: AROM;Both;5 reps;Supine Quad Sets: AROM;Both;5 reps;Supine Long Arc Quad: AROM;Both;5 reps;Seated    General Comments        Pertinent Vitals/Pain Pain Assessment: Faces Faces Pain Scale: Hurts even more Pain Location: L hip during return to supine Pain Descriptors / Indicators: Grimacing;Guarding Pain Intervention(s): Monitored during session    Home Living                      Prior Function            PT Goals (current goals can now be found in the care plan section) Acute Rehab PT Goals Patient Stated Goal: not stated Progress towards PT goals: Not progressing toward goals - comment(limited by pain in L HIP)    Frequency    Min 3X/week      PT Plan Current plan remains appropriate    Co-evaluation              AM-PAC PT "6 Clicks" Daily Activity  Outcome Measure  Difficulty turning over in bed (including adjusting bedclothes, sheets and blankets)?: Unable Difficulty moving from lying on back to sitting on the side of the bed? : Unable Difficulty sitting down on and standing up from a chair with arms (e.g., wheelchair, bedside commode, etc,.)?: Unable Help needed moving to and from a bed to chair (including a wheelchair)?: Total Help needed walking in hospital room?: Total Help needed climbing 3-5 steps with a railing? : Total 6 Click Score: 6    End of Session Equipment Utilized During Treatment: Gait belt;Other (comment)(L hip abductor brace) Activity Tolerance: Patient tolerated treatment well Patient left: in bed;with call bell/phone within reach;with bed alarm set Nurse Communication: Mobility status;Need for lift equipment PT Visit Diagnosis: Other abnormalities of gait and mobility (R26.89)     Time: 6122-4497 PT Time Calculation (min) (ACUTE ONLY): 24 min  Charges:  $Therapeutic Exercise: 8-22 mins $Therapeutic Activity: 8-22 mins                    G Codes:       Kittie Plater, PT,  DPT Pager #: [phone] Office #: [phone]    Radie Berges M Hollis Tuller 12/26/2017, 10:29 AM

## 2017-12-26 NOTE — Progress Notes (Signed)
Reason for consult: Seizure  Subjective:Just received pain medications and is drowsy. No seizures overnight. Complaining of severe pain.    ROS: Unable to obtain due to poor mental status  Examination  Vital signs in last 24 hours: Temp:  [97.7 F (36.5 C)-98.4 F (36.9 C)] 97.8 F (36.6 C) (07/22 1152) Pulse Rate:  [41-87] 49 (07/22 1100) Resp:  [8-25] 17 (07/22 1100) BP: (110-151)/(71-96) 134/77 (07/22 1100) SpO2:  [95 %-100 %] 100 % (07/22 1100) Weight:  [68 kg (149 lb 14.6 oz)] 68 kg (149 lb 14.6 oz) (07/22 0500)  General: Not in distress, cooperative CVS: pulse-normal rate and rhythm RS: breathing comfortably Extremities: normal   Neuro: MS: drowsy, following commands intermittently CN: pupils equal and reactive, face symmetric,  Motor: withdraws bilaterally in lower extremities, moves upper extremities to commands Gait: not tested  Basic Metabolic Panel: Recent Labs  Lab 12/23/17 1451 12/25/17 0215 12/25/17 0304 12/25/17 2246  NA 140 139  --  137  K 4.5 3.4*  --  4.4  CL 110 105  --  107  CO2 22 17*  --  21*  GLUCOSE 97 174*  --  108*  BUN 24* 27*  --  31*  CREATININE 1.65* 1.80*  --  1.64*  CALCIUM 8.6* 8.8*  --  8.3*  MG  --   --  2.0  --   PHOS  --   --  3.1  --     CBC: Recent Labs  Lab 12/23/17 1451 12/25/17 0215  WBC 10.3 15.5*  HGB 11.2* 12.4*  HCT 34.8* 39.9  MCV 107.1* 110.5*  PLT 85* 100*     Coagulation Studies: No results for input(s): LABPROT, INR in the last 72 hours.  Imaging Reviewed:     ASSESSMENT AND PLAN  84 year old male with new onset seizure. Possibly provoked- gabapentin stopped suddenly. Was loaded with Depakote yesterday, no further seizures.   New onset Seizure  Restarted gabapentin, discontinued Depakote Gabapentin also seizure medication- can help with both pain and seizures EEG:  showed gen slowing  MRI brian pending, if unable to obtain then repeat CT head Continue seizure precuations Ok to given  pain medication as tolerated   Zabrina Brotherton [facility] Pager Number [phone] For questions after 7pm please refer to AMION to reach the Neurologist on call

## 2017-12-27 DIAGNOSIS — G4089 Other seizures: Secondary | ICD-10-CM

## 2017-12-27 LAB — GLUCOSE, CAPILLARY
GLUCOSE-CAPILLARY: 146 mg/dL — AB (ref 70–99)
Glucose-Capillary: 85 mg/dL (ref 70–99)
Glucose-Capillary: 89 mg/dL (ref 70–99)
Glucose-Capillary: 110 mg/dL — ABNORMAL HIGH (ref 70–99)

## 2017-12-27 MED ORDER — METOPROLOL TARTRATE 50 MG PO TABS
50.0000 mg | ORAL_TABLET | Freq: Two times a day (BID) | ORAL | Status: DC
  Administered 2017-12-27 – 2017-12-30 (×6): 50 mg via ORAL
  Filled 2017-12-27 (×6): qty 1

## 2017-12-27 MED ORDER — SERTRALINE HCL 50 MG PO TABS
50.0000 mg | ORAL_TABLET | Freq: Every day | ORAL | Status: DC
  Administered 2017-12-27 – 2017-12-29 (×3): 50 mg via ORAL
  Filled 2017-12-27 (×3): qty 1

## 2017-12-27 NOTE — Progress Notes (Signed)
1st attempt to call report. Secretary states RN has been called in to take patient. She is to call when she arrives.

## 2017-12-27 NOTE — Plan of Care (Signed)
  Problem: Education: Goal: Knowledge of General Education information will improve Description Including pain rating scale, medication(s)/side effects and non-pharmacologic comfort measures Outcome: Not Progressing Note:  Educating family. Pt remains confused.

## 2017-12-27 NOTE — Progress Notes (Signed)
Critical Care Progress Note  Reason for ICU admission: Status post witnessed seizure.  History of present illness:  Patient is 84 years old and transferred on 7/17 for severe left hip pain secondary to prosthetic hip dislocation.  Went successful reduction under anesthesia on 7/19.  He was recovering well and undergoing physical therapy and had been previously fully conversant.  He had an unheralded episode of arm flexion eyes rolling unresponsiveness teeth clenching and foaming at the mouth.  Episode ended and was accompanied then by unresponsiveness.  A brief cardiac arrest code was called.  Was found to be hypertensive.  Seen by neurology who felt that this represented a seizure with subsequent Todd's paralysis.  7/22 The patient is alert. He is not speaking presently. He will follow simple commands. He has a fair amount of pain coming from his hip He is in a normal sinus rhythm Has some other pain relate dto possible rib fravcture left from CPR. He appears able to take clear liquids and pills. He stood with PT today earlier.  7/23 The patient is awake. He appears to be uncomfortable.  It appears that to some extent he has "given up" the thought of feeling better. The patient does have a history of depression and takes a small dose of Zolft which he is receiving presently.   Temp:  [97.7 F (36.5 C)-98.4 F (36.9 C)] 97.9 F (36.6 C) (07/22 0856) Pulse Rate:  [41-103] 83 (07/22 1000) Resp:  [8-25] 12 (07/22 1000) BP: (110-151)/(71-104) 135/90 (07/22 1000) SpO2:  [95 %-100 %] 100 % (07/22 1000) Weight:  [149 lb 14.6 oz (68 kg)] 149 lb 14.6 oz (68 kg) (07/22 0500) Physical Exam  Constitutional: He is well-developed, well-nourished, and in no distress.  HENT:  Head: Normocephalic and atraumatic.  Eyes: Pupils are equal, round, and reactive to light. Conjunctivae are normal.  Neck: Normal range of motion. Neck supple. No tracheal deviation present. No thyromegaly present.   Cardiovascular: Normal rate and normal heart sounds.  No murmur heard. Pulmonary/Chest: Effort normal. No respiratory distress.  Abdominal: Soft. Bowel sounds are normal.  Musculoskeletal: Normal range of motion. He exhibits tenderness (left hip tenderness).  Neurological: He is alert.   The patient is a little more engaged today. He is talking to his granddaughter.   Skin: Skin is warm and dry.   Labs:: CBC Latest Ref Rng & Units 12/25/2017 12/23/2017  WBC 4.0 - 10.5 K/uL 15.5(H) 10.3  Hemoglobin 13.0 - 17.0 g/dL 12.4(L) 11.2(L)  Hematocrit 39.0 - 52.0 % 39.9 34.8(L)  Platelets 150 - 400 K/uL 100(L) 85(L)   BMP Latest Ref Rng & Units 12/25/2017 12/25/2017 12/23/2017  Glucose 70 - 99 mg/dL 108(H) 174(H) 97  BUN 8 - 23 mg/dL 31(H) 27(H) 24(H)  Creatinine 0.61 - 1.24 mg/dL 1.64(H) 1.80(H) 1.65(H)  Sodium 135 - 145 mmol/L 137 139 140  Potassium 3.5 - 5.1 mmol/L 4.4 3.4(L) 4.5  Chloride 98 - 111 mmol/L 107 105 110  CO2 22 - 32 mmol/L 21(L) 17(L) 22  Calcium 8.9 - 10.3 mg/dL 8.3(L) 8.8(L) 8.6(L)     Scheduled Meds: . atorvastatin  10 mg Oral q1800  . chlorhexidine  15 mL Mouth Rinse BID  . docusate sodium  100 mg Oral BID  . enoxaparin (LOVENOX) injection  30 mg Subcutaneous Q24H  . gabapentin  600 mg Oral QID  . levothyroxine  25 mcg Oral QAC breakfast  . lidocaine  1 patch Transdermal Q24H  . mouth rinse  15 mL Mouth  Rinse q12n4p  . metoprolol tartrate  50 mg Oral BID  . sertraline  50 mg Oral QHS   Continuous Infusions: . sodium chloride    . sodium chloride 75 mL/hr at 12/27/17 0800   PRN Meds:sodium chloride, HYDROcodone-acetaminophen, HYDROmorphone (DILAUDID) injection, ondansetron **OR** ondansetron (ZOFRAN) IV    I/O last 3 completed shifts: In: 2842.6 [I.V.:2437.5; IV Piggyback:405.1] Out: 1055 [Urine:1055] Intake/Output this shift: Total I/O In: 75.1 [I.V.:75.1] Out: -   Problem Assessment/Plan::  S/p left hip dislocation. S/P repair and patient getting PT  presently. I have written for 1/2 tablet of norco q 4h for moderate pain. Can consider a small dose  Of a Fentanyl patch but I am not sure what effect it will have on his level of alertness etc.,  Seizure No further events. EEG negative. Patient back on his home gabpentin.  Family His granddaughter is upset as the patient appears depressed and it appears that he has " given up" To a certain extent I asked the her about her grandfather's wished were he to take a turn for the worse and she appears conflicted. The patient is presently a full code.  The patient can probably move to a telemetry floor at this time.    Micheal Likens MD [facility] Cell [phone]

## 2017-12-27 NOTE — Progress Notes (Signed)
2nd attempt. Placed on extended hold with no answer. Called back and RN that will be taking report is unavailable and has asked that I call back. Charge RN is aware.

## 2017-12-27 NOTE — Progress Notes (Signed)
CSW spoke with pt dtr concerning SNF options- prefers 1.[facility] 2. [facility]  Will continue to follow and assist with transfer to SNF when stable  Jorge Ny, [place] Social Worker [phone]

## 2017-12-27 NOTE — Care Management Note (Signed)
Case Management Note Marvetta Gibbons RN,BSN Unit [facility] 1-22 Case Manager  [phone]  Patient Details  Name: Thomas Hooper. MRN: [id number] Date of Birth: [birth year]  Subjective/Objective:  Pt admitted as tx from [place] with dislocated hip s/p closed reduction under anesthesia on 7/19.  Post procedure s/p wetnessed seizure and brief cardiac arrest code was called tx to ICU                Action/Plan: PTA pt lived at home, per PT eval recommendation for SNF, CSW has been consulted for placement needs and will follow for transition of care to SNF when medically stable.   Expected Discharge Date:                  Expected Discharge Plan:  Skilled Nursing Facility  In-House Referral:  Clinical Social Work  Discharge planning Services  CM Consult  Post Acute Care Choice:    Choice offered to:     DME Arranged:    DME Agency:     HH Arranged:    [place] Agency:     Status of Service:  In process, will continue to follow  If discussed at Long Length of Stay Meetings, dates discussed:    Discharge Disposition:   Additional Comments:  Dawayne Patricia, RN 12/27/2017, 10:22 AM

## 2017-12-28 DIAGNOSIS — Z419 Encounter for procedure for purposes other than remedying health state, unspecified: Secondary | ICD-10-CM

## 2017-12-28 LAB — BASIC METABOLIC PANEL
Anion gap: 12 (ref 5–15)
BUN: 46 mg/dL — AB (ref 8–23)
CO2: 18 mmol/L — ABNORMAL LOW (ref 22–32)
CREATININE: 1.73 mg/dL — AB (ref 0.61–1.24)
Calcium: 8.7 mg/dL — ABNORMAL LOW (ref 8.9–10.3)
Chloride: 111 mmol/L (ref 98–111)
GFR calc Af Amer: 38 mL/min — ABNORMAL LOW (ref >60)
GFR calc non Af Amer: 33 mL/min — ABNORMAL LOW (ref >60)
Glucose, Bld: 144 mg/dL — ABNORMAL HIGH (ref 70–99)
Potassium: 4.4 mmol/L (ref 3.5–5.1)
Sodium: 141 mmol/L (ref 135–145)

## 2017-12-28 LAB — CBC WITH DIFFERENTIAL/PLATELET
Abs Immature Granulocytes: 0.1 10*3/uL (ref 0.0–0.1)
Basophils Absolute: 0 10*3/uL (ref 0.0–0.1)
Basophils Relative: 0 %
Eosinophils Absolute: 0 10*3/uL (ref 0.0–0.7)
Eosinophils Relative: 0 %
HCT: 34.1 % — ABNORMAL LOW (ref 39.0–52.0)
Hemoglobin: 11.1 g/dL — ABNORMAL LOW (ref 13.0–17.0)
Immature Granulocytes: 1 %
LYMPHS ABS: 0.4 10*3/uL — AB (ref 0.7–4.0)
Lymphocytes Relative: 4 %
MCH: 34.2 pg — AB (ref 26.0–34.0)
MCHC: 32.6 g/dL (ref 30.0–36.0)
MCV: 104.9 fL — ABNORMAL HIGH (ref 78.0–100.0)
MONO ABS: 1 10*3/uL (ref 0.1–1.0)
Monocytes Relative: 10 %
Neutro Abs: 8 10*3/uL — ABNORMAL HIGH (ref 1.7–7.7)
Neutrophils Relative %: 85 %
Platelets: 130 10*3/uL — ABNORMAL LOW (ref 150–400)
RBC: 3.25 MIL/uL — ABNORMAL LOW (ref 4.22–5.81)
RDW: 14.1 % (ref 11.5–15.5)
WBC: 9.4 10*3/uL (ref 4.0–10.5)

## 2017-12-28 LAB — GLUCOSE, CAPILLARY
GLUCOSE-CAPILLARY: 135 mg/dL — AB (ref 70–99)
Glucose-Capillary: 132 mg/dL — ABNORMAL HIGH (ref 70–99)
Glucose-Capillary: 146 mg/dL — ABNORMAL HIGH (ref 70–99)
Glucose-Capillary: 148 mg/dL — ABNORMAL HIGH (ref 70–99)

## 2017-12-28 LAB — TSH: TSH: 3.78 u[IU]/mL (ref 0.350–4.500)

## 2017-12-28 LAB — T4, FREE: Free T4: 0.9 ng/dL (ref 0.82–1.77)

## 2017-12-28 NOTE — Progress Notes (Signed)
PROGRESS NOTE  Thomas Dehart Jr. DTO:[id number] DOB: [birth year] DOA: 12/23/2017 PCP: Townsend Roger, MD  HPI/Recap of past 24 hours:  Thomas James. is a 84 y.o. male with medical history significant of HTN, systolic CHF, chronic kidney disease stage III, mild dementia, who was admitted to [facility] day before yesterday after having a fall at home and noted severe hip and his pain.  He has a history of bilateral hip replacements years ago, bilateral knee replacements, and in [place] ED he was found to have the left hip dislocated. Hospitalist at [place] discussed with Dr. Delfino Lovett and patient was transferred. Went successful reduction under anesthesia on 7/19. He was recovering well and undergoing physical therapy and had been previously fully conversant. He had an unheralded episode of arm flexion eyes rolling unresponsiveness teeth clenching and foaming at the mouth.  Episode ended and was accompanied then by unresponsiveness.  A brief cardiac arrest code was called.  Was found to be hypertensive. Seen by neurology who felt that this represented a seizure with subsequent Todd's paralysis.  12/28/17: Seen and examined at his bedside.  Alert but confused.  Does not appear to be in any acute distress.    Assessment/Plan: Active Problems:   Paroxysmal atrial fibrillation (HCC)   CKD (chronic kidney disease) stage 3, GFR 30-59 ml/min (HCC)   Dementia   Status post closed reduction of dislocated total hip prosthesis   Seizure (HCC)   Rib fractures   Left hip dislocation status post repair Continue PT as tolerated Pain management and bowel regimen in place Fall precautions  New onset seizure Restarted gabapentin per neurology Discontinue Depakote Seizure precautions Repeat CT head since unable to obtain MRI  Accelerated hypertension DC IV fluid Start amlodipine 5 mg daily  Acute metabolic encephalopathy Possibly secondary to uremia with BUN of 46 and creatinine of 1.73 Reorient as  needed Fall precautions  Chronic systolic CHF Last 2D echo revealed EF of 40 to 45% Avoid IV fluid hydration if possible Strict I's and O's Daily weight Continue current medications  CKD 3-4 Baseline creatinine 1.6 from 1.73 GFR 33 Avoid nephrotoxic agents/hypotension/dehydration  Elevated troponin Most likely demand ischemia Troponins trended down and peaked at 0.41  Chronic depression Continue Zoloft  Code Status: Full code  Family Communication: None at bedside  Disposition Plan: Home versus SNF when clinically stable   Consultants:  Neurology  Procedures:  None  Antimicrobials:  None  DVT prophylaxis: Subcu Lovenox daily   Objective: Vitals:   12/28/17 0507 12/28/17 0634 12/28/17 0948 12/28/17 1436  BP: (!) 153/88  (!) 150/104 (!) 159/89  Pulse: 70  75 61  Resp: 17  19 18   Temp: 97.9 F (36.6 C)  98.2 F (36.8 C) (!) 97.5 F (36.4 C)  TempSrc: Axillary  Oral Oral  SpO2: 94%  98% 98%  Weight:  71.8 kg (158 lb 4.6 oz)    Height:        Intake/Output Summary (Last 24 hours) at 12/28/2017 1503 Last data filed at 12/28/2017 0600 Gross per 24 hour  Intake 643.88 ml  Output 825 ml  Net -181.12 ml   Filed Weights   12/26/17 0500 12/27/17 0500 12/28/17 0634  Weight: 68 kg (149 lb 14.6 oz) 69 kg (152 lb 1.9 oz) 71.8 kg (158 lb 4.6 oz)    Exam:  . General: 84 y.o. year-old male well developed well nourished in no acute distress.  Alert but confused. . Cardiovascular: Regular rate and rhythm with no  rubs or gallops.  No thyromegaly or JVD noted.   Marland Kitchen Respiratory: Clear to auscultation with no wheezes or rales. Good inspiratory effort. . Abdomen: Soft nontender nondistended with normal bowel sounds x4 quadrants. . Psychiatry: Unable to assess due to altered mental status.   Data Reviewed: CBC: Recent Labs  Lab 12/23/17 1451 12/25/17 0215 12/28/17 0521  WBC 10.3 15.5* 9.4  NEUTROABS  --   --  8.0*  HGB 11.2* 12.4* 11.1*  HCT 34.8* 39.9  34.1*  MCV 107.1* 110.5* 104.9*  PLT 85* 100* 308*   Basic Metabolic Panel: Recent Labs  Lab 12/23/17 1451 12/25/17 0215 12/25/17 0304 12/25/17 2246 12/28/17 0521  NA 140 139  --  137 141  K 4.5 3.4*  --  4.4 4.4  CL 110 105  --  107 111  CO2 22 17*  --  21* 18*  GLUCOSE 97 174*  --  108* 144*  BUN 24* 27*  --  31* 46*  CREATININE 1.65* 1.80*  --  1.64* 1.73*  CALCIUM 8.6* 8.8*  --  8.3* 8.7*  MG  --   --  2.0  --   --   PHOS  --   --  3.1  --   --    GFR: Estimated Creatinine Clearance: 28.2 mL/min (A) (by C-G formula based on SCr of 1.73 mg/dL (H)). Liver Function Tests: Recent Labs  Lab 12/23/17 1451 12/25/17 0215  AST 21 45*  ALT 14 24  ALKPHOS 56 63  BILITOT 1.1 0.8  PROT 5.6* 6.1*  ALBUMIN 3.1* 3.1*   No results for input(s): LIPASE, AMYLASE in the last 168 hours. No results for input(s): AMMONIA in the last 168 hours. Coagulation Profile: No results for input(s): INR, PROTIME in the last 168 hours. Cardiac Enzymes: Recent Labs  Lab 12/25/17 0758 12/25/17 1213 12/25/17 1909  TROPONINI 0.38* 0.41* 0.39*   BNP (last 3 results) No results for input(s): PROBNP in the last 8760 hours. HbA1C: No results for input(s): HGBA1C in the last 72 hours. CBG: Recent Labs  Lab 12/27/17 0739 12/27/17 1224 12/27/17 2018 12/28/17 0000 12/28/17 0456  GLUCAP 89 110* 146* 148* 135*   Lipid Profile: No results for input(s): CHOL, HDL, LDLCALC, TRIG, CHOLHDL, LDLDIRECT in the last 72 hours. Thyroid Function Tests: Recent Labs    12/28/17 0521  TSH 3.780  FREET4 0.90   Anemia Panel: No results for input(s): VITAMINB12, FOLATE, FERRITIN, TIBC, IRON, RETICCTPCT in the last 72 hours. Urine analysis: No results found for: COLORURINE, APPEARANCEUR, LABSPEC, PHURINE, GLUCOSEU, HGBUR, BILIRUBINUR, KETONESUR, PROTEINUR, UROBILINOGEN, NITRITE, LEUKOCYTESUR Sepsis Labs: @LABRCNTIP (procalcitonin:4,lacticidven:4)  ) Recent Results (from the past 240 hour(s))  MRSA  PCR Screening     Status: None   Collection Time: 12/23/17  3:36 PM  Result Value Ref Range Status   MRSA by PCR NEGATIVE NEGATIVE Final    Comment:        The GeneXpert MRSA Assay (FDA approved for NASAL specimens only), is one component of a comprehensive MRSA colonization surveillance program. It is not intended to diagnose MRSA infection nor to guide or monitor treatment for MRSA infections. Performed at [facility], [place] [address], [place], [place] [address]       Studies: No results found.  Scheduled Meds: . atorvastatin  10 mg Oral q1800  . chlorhexidine  15 mL Mouth Rinse BID  . docusate sodium  100 mg Oral BID  . enoxaparin (LOVENOX) injection  30 mg Subcutaneous Q24H  .  gabapentin  600 mg Oral QID  . levothyroxine  25 mcg Oral QAC breakfast  . lidocaine  1 patch Transdermal Q24H  . mouth rinse  15 mL Mouth Rinse q12n4p  . metoprolol tartrate  50 mg Oral BID  . sertraline  50 mg Oral QHS    Continuous Infusions: . sodium chloride       LOS: 5 days     Kayleen Memos, MD [facility] Pager [phone]  If 7PM-7AM, please contact night-coverage [email or web address] Password [facility] 12/28/2017, 3:03 PM

## 2017-12-28 NOTE — Care Management Important Message (Signed)
Important Message  Patient Details  Name: Thomas Hooper. MRN: [id number] Date of Birth: February 25, 1925   Medicare Important Message Given:  Yes    [name] 12/28/2017, 4:25 PM

## 2017-12-28 NOTE — Plan of Care (Signed)

## 2017-12-28 NOTE — Progress Notes (Signed)
F/C d/c per verbal order Dr hall. Will monitor for voiding.

## 2017-12-28 NOTE — Progress Notes (Signed)
CSW spoke with the patient's granddaughter- she wanted to inform the CSW that [facility] is her preferred choice.

## 2017-12-28 NOTE — Progress Notes (Signed)
Physical Therapy Treatment Patient Details Name: Thomas Hooper. MRN: [id number] DOB: 08-21-1924 Today's Date: 12/28/2017    History of Present Illness Pt is a 84 y.o. male with medical history significant of HTN, systolic CHF, chronic kidney disease stage III, mild dementia, s/p B THA, and s/p B TKA.  He was bending over in his garden when he had immediate left hip pain and fell.  He was taken to [facility] and found to have a dislocated L hip. Attempts at closed reduction failed at [facility]. Pt was, therefore, transfered to [facility] and underwent successful closed reduction in the OR 12-23-17. Pt with reported code blue and possible seizure activity on 7/21.     PT Comments    Pt with slow progression towards goals. Did not seem to be as agitated with mobility as in previous session, however, did continue to be limited by pain. Required mod to max A +2 for mobility tasks this session. Pt requesting to lie back down following sit<>Stand X2. Pt unable to maintain attention to perform supine HEP. Educated pt's family about role of PT and current recommendations for SNF. Current recommendations appropriate.  Will continue to follow acutely to maximize functional mobility independence and safety.   Follow Up Recommendations  SNF;Supervision/Assistance - 24 hour     Equipment Recommendations  None recommended by PT    Recommendations for Other Services       Precautions / Restrictions Precautions Precautions: Fall;Posterior Hip Precaution Booklet Issued: No Precaution Comments: Pt unable to comprehend posterior hip precautions.  Required Braces or Orthoses: Other Brace/Splint Other Brace/Splint: L hip abduction brace at all times except for hygiene.  Restrictions Weight Bearing Restrictions: Yes LLE Weight Bearing: Weight bearing as tolerated    Mobility  Bed Mobility Overal bed mobility: Needs Assistance Bed Mobility: Supine to Sit;Sit to Supine     Supine to sit: Max assist;+2 for  physical assistance Sit to supine: Max assist;+2 for physical assistance   General bed mobility comments: Pt required assist with trunk elevation and use of bed pad to assist with scooting hips to EOB. Increased time required and required multimodal cues for sequencing.   Transfers Overall transfer level: Needs assistance Equipment used: 2 person hand held assist Transfers: Sit to/from Stand Sit to Stand: Mod assist;+2 physical assistance         General transfer comment: Mod A +2 for lift assist and steadying. Pt unable to fully extend LLE during standing and presented with limited weight acceptance on LLE. Gave cues for upright posture, however, pt not responding to cues.   Ambulation/Gait             General Gait Details: unable at this time   Stairs             Wheelchair Mobility    Modified Rankin (Stroke Patients Only)       Balance Overall balance assessment: Needs assistance Sitting-balance support: Bilateral upper extremity supported Sitting balance-Leahy Scale: Poor Sitting balance - Comments: Pt reliant on min A to maintain sitting balance.    Standing balance support: During functional activity Standing balance-Leahy Scale: Zero Standing balance comment: reliant on external support of +2 assist                            Cognition Arousal/Alertness: Awake/alert Behavior During Therapy: Flat affect Overall Cognitive Status: Impaired/Different from baseline Area of Impairment: Orientation;Attention;Memory;Following commands;Safety/judgement;Problem solving;Awareness  Orientation Level: Disoriented to;Place;Time;Situation Current Attention Level: Sustained Memory: Decreased short-term memory Following Commands: Follows one step commands with increased time Safety/Judgement: Decreased awareness of safety;Decreased awareness of deficits Awareness: Intellectual Problem Solving: Slow processing;Difficulty  sequencing;Requires verbal cues;Requires tactile cues General Comments: Did not note any agitation, as in last PT session. Pt pleasant and politely requesting to return to supine secondary to pain. Did ask for PT to stop asking so many questions.       Exercises      General Comments General comments (skin integrity, edema, etc.): Pt's grandaughter present during session. Educated about importance of 24/7 assist and recommendations for SNF.       Pertinent Vitals/Pain Pain Assessment: Faces Faces Pain Scale: Hurts whole lot Pain Location: L hip during mobility, especially return to supine; ribs Pain Descriptors / Indicators: Grimacing;Guarding Pain Intervention(s): Limited activity within patient's tolerance;Monitored during session;Repositioned    Home Living                      Prior Function            PT Goals (current goals can now be found in the care plan section) Acute Rehab PT Goals Patient Stated Goal: not stated PT Goal Formulation: Patient unable to participate in goal setting Time For Goal Achievement: 01/07/18 Potential to Achieve Goals: Fair Progress towards PT goals: Progressing toward goals    Frequency    Min 3X/week      PT Plan Current plan remains appropriate    Co-evaluation              AM-PAC PT "6 Clicks" Daily Activity  Outcome Measure  Difficulty turning over in bed (including adjusting bedclothes, sheets and blankets)?: Unable Difficulty moving from lying on back to sitting on the side of the bed? : Unable Difficulty sitting down on and standing up from a chair with arms (e.g., wheelchair, bedside commode, etc,.)?: Unable Help needed moving to and from a bed to chair (including a wheelchair)?: Total Help needed walking in hospital room?: Total Help needed climbing 3-5 steps with a railing? : Total 6 Click Score: 6    End of Session         PT Visit Diagnosis: Other abnormalities of gait and mobility (R26.89)      Time: 1610-9604 PT Time Calculation (min) (ACUTE ONLY): 23 min  Charges:  $Therapeutic Activity: 23-37 mins                    G Codes:       Leighton Ruff, PT, DPT  Acute Rehabilitation Services  Pager: [phone]    Rudean Hitt 12/28/2017, 1:08 PM

## 2017-12-29 ENCOUNTER — Inpatient Hospital Stay (HOSPITAL_COMMUNITY): Payer: Medicare Other

## 2017-12-29 LAB — CBC
HEMATOCRIT: 33.4 % — AB (ref 39.0–52.0)
Hemoglobin: 10.8 g/dL — ABNORMAL LOW (ref 13.0–17.0)
MCH: 34.3 pg — ABNORMAL HIGH (ref 26.0–34.0)
MCHC: 32.3 g/dL (ref 30.0–36.0)
MCV: 106 fL — AB (ref 78.0–100.0)
Platelets: 154 10*3/uL (ref 150–400)
RBC: 3.15 MIL/uL — AB (ref 4.22–5.81)
RDW: 14.3 % (ref 11.5–15.5)
WBC: 11 10*3/uL — AB (ref 4.0–10.5)

## 2017-12-29 LAB — BASIC METABOLIC PANEL
ANION GAP: 10 (ref 5–15)
BUN: 48 mg/dL — ABNORMAL HIGH (ref 8–23)
CO2: 20 mmol/L — ABNORMAL LOW (ref 22–32)
Calcium: 8.8 mg/dL — ABNORMAL LOW (ref 8.9–10.3)
Chloride: 114 mmol/L — ABNORMAL HIGH (ref 98–111)
Creatinine, Ser: 1.83 mg/dL — ABNORMAL HIGH (ref 0.61–1.24)
GFR, EST AFRICAN AMERICAN: 35 mL/min — AB (ref >60)
GFR, EST NON AFRICAN AMERICAN: 31 mL/min — AB (ref >60)
Glucose, Bld: 118 mg/dL — ABNORMAL HIGH (ref 70–99)
Potassium: 4 mmol/L (ref 3.5–5.1)
Sodium: 144 mmol/L (ref 135–145)

## 2017-12-29 LAB — GLUCOSE, CAPILLARY
Glucose-Capillary: 108 mg/dL — ABNORMAL HIGH (ref 70–99)
Glucose-Capillary: 116 mg/dL — ABNORMAL HIGH (ref 70–99)
Glucose-Capillary: 131 mg/dL — ABNORMAL HIGH (ref 70–99)
Glucose-Capillary: 97 mg/dL (ref 70–99)

## 2017-12-29 LAB — HEMOGLOBIN A1C
HEMOGLOBIN A1C: 5.7 % — AB (ref 4.8–5.6)
Mean Plasma Glucose: 116.89 mg/dL

## 2017-12-29 LAB — LIPID PANEL
CHOL/HDL RATIO: 5.3 ratio
Cholesterol: 95 mg/dL (ref 0–200)
HDL: 18 mg/dL — AB (ref >40)
LDL Cholesterol: 59 mg/dL (ref 0–99)
TRIGLYCERIDES: 88 mg/dL (ref <150)
VLDL: 18 mg/dL (ref 0–40)

## 2017-12-29 MED ORDER — AMLODIPINE BESYLATE 5 MG PO TABS
5.0000 mg | ORAL_TABLET | Freq: Every day | ORAL | Status: DC
  Administered 2017-12-29 – 2017-12-30 (×2): 5 mg via ORAL
  Filled 2017-12-29 (×2): qty 1

## 2017-12-29 MED ORDER — ASPIRIN EC 81 MG PO TBEC
81.0000 mg | DELAYED_RELEASE_TABLET | Freq: Every day | ORAL | Status: DC
  Administered 2017-12-29 – 2017-12-30 (×2): 81 mg via ORAL
  Filled 2017-12-29 (×2): qty 1

## 2017-12-29 NOTE — Progress Notes (Signed)
Physical Therapy Treatment Patient Details Name: Thomas Hooper. MRN: [id number] DOB: 1925/02/19 Today's Date: 12/29/2017    History of Present Illness Pt is a 84 y.o. male with medical history significant of HTN, systolic CHF, chronic kidney disease stage III, mild dementia, s/p B THA, and s/p B TKA.  He was bending over in his garden when he had immediate left hip pain and fell.  He was taken to [facility] and found to have a dislocated L hip. Attempts at closed reduction failed at [facility]. Pt was, therefore, transfered to [facility] and underwent successful closed reduction in the OR 12-23-17. Pt with reported code blue and possible seizure activity on 7/21.     PT Comments    Pt making slow progression towards goals. Required Mod A +2 for supine to sit but was able to initiate leg movement towards side of bed with cueing. Pt able to sit EOB with UE support for approx. 2 minutes before demonstrating an anterior weight shift putting pt at risk of breaking hip precautions, at which time he needed min A to sit upright again. Pt required Mod A +2 from sit to stand.  Pt initiated stand pivot towards chair with multimodal cues, but required Mod A x 2 in order to complete transfer into chair. Pt did not express signs of agitation this session. Pt will continue to benefit from skilled PT at this time in order to improve mobility, strength and overall safety with transfers.      Follow Up Recommendations  SNF;Supervision/Assistance - 24 hour     Equipment Recommendations  None recommended by PT    Recommendations for Other Services       Precautions / Restrictions Precautions Precautions: Fall;Posterior Hip Precaution Booklet Issued: No Precaution Comments: Pt unable to comprehend posterior hip precautions.  Required Braces or Orthoses: Other Brace/Splint Other Brace/Splint: L hip abduction brace at all times except for hygiene.  Restrictions Weight Bearing Restrictions: Yes LLE Weight Bearing:  Weight bearing as tolerated    Mobility  Bed Mobility Overal bed mobility: Needs Assistance Bed Mobility: Supine to Sit     Supine to sit: +2 for physical assistance;Mod assist     General bed mobility comments: Pt required tactile cues to initiate leg movements and mod assist x2 with trunk elevation to sit EOB; able to sit with (B) UE support without therapist assist. Reqired assist to scoot EOB to get feet flat on floor   Transfers Overall transfer level: Needs assistance Equipment used: Rolling walker (2 wheeled) Transfers: Sit to/from Stand;Stand Pivot Transfers(Had to guide patient to get him to pivot) Sit to Stand: Mod assist;+2 physical assistance         General transfer comment: Mod A x 2 to get patient from EOB to standing at walker; reqired multimodel cues needed for patient to stand up taller as pt cont. to flex trunk forward as stood longer. Pt responded to cue the best when told to look out window.   Ambulation/Gait             General Gait Details: unable at this time   Stairs             Wheelchair Mobility    Modified Rankin (Stroke Patients Only)       Balance Overall balance assessment: Needs assistance Sitting-balance support: Bilateral upper extremity supported Sitting balance-Leahy Scale: Poor Sitting balance - Comments: Pt able to sit up right with UE support and no therapist assist for approx. 2 minutes before  increased forward trunk bend putting pt at risk for breaking precations (at what time therapists assisted to sit pt upright)   Standing balance support: Bilateral upper extremity supported;During functional activity Standing balance-Leahy Scale: Poor Standing balance comment: Stood at walker and mod A x1                             Cognition Arousal/Alertness: Awake/alert Behavior During Therapy: Flat affect Overall Cognitive Status: Impaired/Different from baseline Area of Impairment:  Orientation;Attention;Memory;Following commands;Safety/judgement;Problem solving;Awareness                 Orientation Level: Disoriented to;Place;Time;Situation Current Attention Level: Sustained Memory: Decreased short-term memory Following Commands: Follows one step commands with increased time Safety/Judgement: Decreased awareness of safety;Decreased awareness of deficits Awareness: Intellectual Problem Solving: Slow processing;Difficulty sequencing;Requires verbal cues;Requires tactile cues General Comments: No agitation demonstrated during session, required verbal and tactile cues along with demonstration with exercises      Exercises General Exercises - Lower Extremity Ankle Circles/Pumps: AROM;5 reps;Both;Seated Long Arc Quad: AROM;Strengthening;Seated;Both;10 reps Heel Slides: PROM;AROM;10 reps;Seated    General Comments        Pertinent Vitals/Pain Pain Assessment: Faces Faces Pain Scale: Hurts little more Pain Location: L hip when moving from standing to sit into chair Pain Descriptors / Indicators: Grimacing;Guarding Pain Intervention(s): Monitored during session    Home Living                      Prior Function            PT Goals (current goals can now be found in the care plan section) Acute Rehab PT Goals Patient Stated Goal: not stated PT Goal Formulation: Patient unable to participate in goal setting Time For Goal Achievement: 01/07/18 Potential to Achieve Goals: Fair Progress towards PT goals: Progressing toward goals    Frequency    Min 3X/week      PT Plan Current plan remains appropriate    Co-evaluation              AM-PAC PT "6 Clicks" Daily Activity  Outcome Measure  Difficulty turning over in bed (including adjusting bedclothes, sheets and blankets)?: Unable Difficulty moving from lying on back to sitting on the side of the bed? : Unable Difficulty sitting down on and standing up from a chair with arms (e.g.,  wheelchair, bedside commode, etc,.)?: Unable Help needed moving to and from a bed to chair (including a wheelchair)?: A Lot Help needed walking in hospital room?: Total Help needed climbing 3-5 steps with a railing? : Total 6 Click Score: 7    End of Session Equipment Utilized During Treatment: Gait belt Activity Tolerance: Patient tolerated treatment well;Patient limited by fatigue Patient left: in chair;with call bell/phone within reach;with family/visitor present Nurse Communication: Mobility status;Need for lift equipment PT Visit Diagnosis: Other abnormalities of gait and mobility (R26.89)     Time: 8891-6945 PT Time Calculation (min) (ACUTE ONLY): 21 min  Charges:                        Einar Crow, SPT  Student Physical Therapist Acute Rehab [phone]   Einar Crow 12/29/2017, 3:10 PM

## 2017-12-29 NOTE — Progress Notes (Signed)
   12/29/17 1600  PT Time Calculation  PT Start Time (ACUTE ONLY) 1346  PT Stop Time (ACUTE ONLY) 1407  PT Time Calculation (min) (ACUTE ONLY) 21 min  PT General Charges  $$ ACUTE PT VISIT 1 Visit  PT Treatments  $Therapeutic Activity 8-22 mins   [place], [place], [place] [phone]

## 2017-12-29 NOTE — Progress Notes (Addendum)
CSW - for clarity -CSW called emergency contact provided on patients demographics. Spoke with [name], and she confirmed that she was his granddaughter not his daughter as listed on patient's emergency contact information.  Thurmond Butts, [place] Social Worker [phone]

## 2017-12-29 NOTE — Progress Notes (Addendum)
PROGRESS NOTE  Thomas Patmon Jr. SHF:[id number] DOB: 1924/08/19 DOA: 12/23/2017 PCP: Townsend Roger, MD  HPI/Recap of past 24 hours:  Thomas Hooper. is a 84 y.o. male with medical history significant of HTN, systolic CHF, chronic kidney disease stage III, mild dementia, who was admitted to [facility] day before yesterday after having a fall at home and noted severe hip and his pain.  He has a history of bilateral hip replacements years ago, bilateral knee replacements, and in [place] ED he was found to have the left hip dislocated. Hospitalist at [facility] discussed with Dr. Delfino Lovett and patient was transferred. Went successful reduction under anesthesia on 7/19. He was recovering well and undergoing physical therapy and had been previously fully conversant. He had an unheralded episode of arm flexion eyes rolling unresponsiveness teeth clenching and foaming at the mouth.  Episode ended and was accompanied then by unresponsiveness.  A brief cardiac arrest code was called.  Was found to be hypertensive. Seen by neurology who felt that this represented a seizure with subsequent Todd's paralysis.  12/28/17: Seen and examined at his bedside.  Alert but confused.  Does not appear to be in any acute distress.  12/29/17: Seen and examined with his granddaughter at bedside.  Somnolent but easily arousable to voices.  Per RN patient coughing when swallowing water with pills.  Speech therapist consulted to assess.    Assessment/Plan: Active Problems:   Paroxysmal atrial fibrillation (HCC)   CKD (chronic kidney disease) stage 3, GFR 30-59 ml/min (HCC)   Dementia   Status post closed reduction of dislocated total hip prosthesis   Seizure (HCC)   Rib fractures   Left hip dislocation status post repair Continue PT as tolerated Out of bed to chair with every shift Pain management and bowel regimen in place Fall precautions  New onset seizure Continue gabapentin per neurology Seizure  precautions Repeat CT head done on 12/29/2017 revealed no acute intracranial findings but revealed small remote infarcts in the left cerebellum, low right parietal cortex, left paramedian occipital cortex and left caudate head.  Remote L cerebellar infarcts/Scattered infarcts affecting low right parietal cortex, left paramedian occipital cortex and left caudate head Lipid panel, A1c Speech therapy eval PT recommends SNF ASA, lipitor  Recent cardiac arrest s/p CPR Hx of Paroxysmal afib C/w monitoring on telemetry Obtain 12 leads EKG  Accelerated HTN Start metoprolol 12.5 mg BID Avoid aceI or ARB due to worsening cr 1.83  Persistent Acute metabolic encephalopathy, most likely multifactorial Possibly secondary to uremia vs remote infarcts Reorient as needed Fall precautions  Cardiomyopathy/chronic systolic CHF Last 2D echo 12/26/17 revealed EF of 40 to 45% Avoid IV fluid hydration if possible Strict I's and O's Daily weight Follows with cardiology outpatient Dr Jenne Campus   Worsening CKD 4 Baseline creatinine 1.6 GFR 35 Cr 1.83 from 1.73 Avoid nephrotoxic agents/hypotension/dehydration  Elevated troponin Most likely demand ischemia Troponins trended down and peaked at 0.41  Chronic depression Continue Zoloft  Code Status: Full code  Family Communication: None at bedside  Disposition Plan: Home versus SNF when clinically stable   Consultants:  Neurology  Speech therapy  Procedures:  None  Antimicrobials:  None  DVT prophylaxis: Subcu Lovenox daily   Objective: Vitals:   12/29/17 0357 12/29/17 0500 12/29/17 0703 12/29/17 1256  BP: (!) 168/95  (!) 168/96 (!) 168/96  Pulse: 64  68 68  Resp: 19     Temp: 98.1 F (36.7 C)     TempSrc: Oral  SpO2: 99%     Weight:  71.1 kg (156 lb 12 oz)    Height:        Intake/Output Summary (Last 24 hours) at 12/29/2017 1416 Last data filed at 12/29/2017 0400 Gross per 24 hour  Intake -  Output 800 ml   Net -800 ml   Filed Weights   12/27/17 0500 12/28/17 0634 12/29/17 0500  Weight: 69 kg (152 lb 1.9 oz) 71.8 kg (158 lb 4.6 oz) 71.1 kg (156 lb 12 oz)    Exam:  . General: 84 y.o. year-old male WD WN Somnolent but easily arousable to voices. No acute distress. . Cardiovascular: RRR no rubs or gallops. No JVD or thyromegaly. Marland Kitchen Respiratory: Clear to auscultation with no wheezes or rales. Good inspiratory effort. . Abdomen: Soft nontender nondistended with normal bowel sounds x4 quadrants. . Psychiatry: Unable to assess due to altered mental status.   Data Reviewed: CBC: Recent Labs  Lab 12/23/17 1451 12/25/17 0215 12/28/17 0521 12/29/17 0428  WBC 10.3 15.5* 9.4 11.0*  NEUTROABS  --   --  8.0*  --   HGB 11.2* 12.4* 11.1* 10.8*  HCT 34.8* 39.9 34.1* 33.4*  MCV 107.1* 110.5* 104.9* 106.0*  PLT 85* 100* 130* 536   Basic Metabolic Panel: Recent Labs  Lab 12/23/17 1451 12/25/17 0215 12/25/17 0304 12/25/17 2246 12/28/17 0521 12/29/17 0428  NA 140 139  --  137 141 144  K 4.5 3.4*  --  4.4 4.4 4.0  CL 110 105  --  107 111 114*  CO2 22 17*  --  21* 18* 20*  GLUCOSE 97 174*  --  108* 144* 118*  BUN 24* 27*  --  31* 46* 48*  CREATININE 1.65* 1.80*  --  1.64* 1.73* 1.83*  CALCIUM 8.6* 8.8*  --  8.3* 8.7* 8.8*  MG  --   --  2.0  --   --   --   PHOS  --   --  3.1  --   --   --    GFR: Estimated Creatinine Clearance: 26.4 mL/min (A) (by C-G formula based on SCr of 1.83 mg/dL (H)). Liver Function Tests: Recent Labs  Lab 12/23/17 1451 12/25/17 0215  AST 21 45*  ALT 14 24  ALKPHOS 56 63  BILITOT 1.1 0.8  PROT 5.6* 6.1*  ALBUMIN 3.1* 3.1*   No results for input(s): LIPASE, AMYLASE in the last 168 hours. No results for input(s): AMMONIA in the last 168 hours. Coagulation Profile: No results for input(s): INR, PROTIME in the last 168 hours. Cardiac Enzymes: Recent Labs  Lab 12/25/17 0758 12/25/17 1213 12/25/17 1909  TROPONINI 0.38* 0.41* 0.39*   BNP (last 3  results) No results for input(s): PROBNP in the last 8760 hours. HbA1C: No results for input(s): HGBA1C in the last 72 hours. CBG: Recent Labs  Lab 12/28/17 0456 12/28/17 1835 12/28/17 2014 12/29/17 0455 12/29/17 1227  GLUCAP 135* 146* 132* 108* 116*   Lipid Profile: No results for input(s): CHOL, HDL, LDLCALC, TRIG, CHOLHDL, LDLDIRECT in the last 72 hours. Thyroid Function Tests: Recent Labs    12/28/17 0521  TSH 3.780  FREET4 0.90   Anemia Panel: No results for input(s): VITAMINB12, FOLATE, FERRITIN, TIBC, IRON, RETICCTPCT in the last 72 hours. Urine analysis: No results found for: COLORURINE, APPEARANCEUR, LABSPEC, PHURINE, GLUCOSEU, HGBUR, BILIRUBINUR, KETONESUR, PROTEINUR, UROBILINOGEN, NITRITE, LEUKOCYTESUR Sepsis Labs: @LABRCNTIP (procalcitonin:4,lacticidven:4)  ) Recent Results (from the past 240 hour(s))  MRSA PCR Screening     Status:  None   Collection Time: 12/23/17  3:36 PM  Result Value Ref Range Status   MRSA by PCR NEGATIVE NEGATIVE Final    Comment:        The GeneXpert MRSA Assay (FDA approved for NASAL specimens only), is one component of a comprehensive MRSA colonization surveillance program. It is not intended to diagnose MRSA infection nor to guide or monitor treatment for MRSA infections. Performed at [facility], [place] [address] [place], [place] [address]       Studies: Ct Head Wo Contrast  Result Date: 12/29/2017 CLINICAL DATA:  Focal neuro deficit with stroke suspected EXAM: CT HEAD WITHOUT CONTRAST TECHNIQUE: Contiguous axial images were obtained from the base of the skull through the vertex without intravenous contrast. COMPARISON:  Four days ago FINDINGS: Brain: No detected acute infarct. Small remote infarcts again seen in the left cerebellum, low right parietal cortex, left paramedian occipital cortex, and left caudate head. Mild for age cerebral volume loss Vascular: No hyperdense vessel.  Atherosclerotic calcification Skull:  Negative Sinuses/Orbits: Chronic left sphenoid sinusitis with sclerotic wall thickening and partial opacification. IMPRESSION: 1. No acute finding. 2. Small remote infarcts as described above. Electronically Signed   By: Monte Fantasia M.D.   On: 12/29/2017 08:41    Scheduled Meds: . amLODipine  5 mg Oral Daily  . atorvastatin  10 mg Oral q1800  . chlorhexidine  15 mL Mouth Rinse BID  . docusate sodium  100 mg Oral BID  . enoxaparin (LOVENOX) injection  30 mg Subcutaneous Q24H  . gabapentin  600 mg Oral QID  . levothyroxine  25 mcg Oral QAC breakfast  . lidocaine  1 patch Transdermal Q24H  . mouth rinse  15 mL Mouth Rinse q12n4p  . metoprolol tartrate  50 mg Oral BID  . sertraline  50 mg Oral QHS    Continuous Infusions: . sodium chloride       LOS: 6 days     Kayleen Memos, MD [facility] Pager [phone]  If 7PM-7AM, please contact night-coverage [email or web address] Password [email or web address] 12/29/2017, 2:16 PM

## 2017-12-29 NOTE — Evaluation (Signed)
Clinical/Bedside Swallow Evaluation Patient Details  Name: Thomas Hooper. MRN: [id number] Date of Birth: 09/11/1924  Today's Date: 12/29/2017 Time: SLP Start Time (ACUTE ONLY): 1551 SLP Stop Time (ACUTE ONLY): 1621 SLP Time Calculation (min) (ACUTE ONLY): 30 min  Past Medical History:  Past Medical History:  Diagnosis Date  . Atrial fibrillation ([place])   . Chronic kidney disease    Stage 3  . Dementia   . Hypertension   . Neuropathy   . Thyroid disease   . TIA (transient ischemic attack)    Past Surgical History:  Past Surgical History:  Procedure Laterality Date  . BACK SURGERY    . CHOLECYSTECTOMY    . HIP CLOSED REDUCTION Left 12/23/2017   Procedure: CLOSED REDUCTION HIP;  Surgeon: Rod Can, MD;  Location: [facility];  Service: Orthopedics;  Laterality: Left;  . HIP SURGERY    . KNEE SURGERY    . LOOP RECORDER INSERTION N/A 05/10/2017   Procedure: LOOP RECORDER INSERTION;  Surgeon: Constance Haw, MD;  Location: [place] [facility];  Service: Cardiovascular;  Laterality: N/A;  . SHOULDER SURGERY     HPI:  Thomas Hooper is a 84 y.o. male with medical history significant of HTN, systolic CHF, chronic kidney disease stage III, mild dementia, s/p B THA, and s/p B TKA.  He was bending over in his garden when he had immediate left hip pain and fell.  He was taken to [facility] and found to have a dislocated L hip. Attempts at closed reduction failed at [facility]. Pt was, therefore, transfered to [facility] and underwent successful closed reduction in the OR 12-23-17. Pt with reported code blue and possible seizure activity on 7/21. Pt has been having difficulty swallowing with coughing noted by staff when taking pills with water   Assessment / Plan / Recommendation Clinical Impression  Pt presents with mild oral dysphagia likely 2/2 edentulism and AMS and clinical indicators of pharyngeal dysphagia.  With thin liquid by straw there was wet vocal quality noted x1 and weak, prolonged  coughing x1.  Cup sips prevented clinical s/s of aspiration.  With regular solid, pt required prolonged mastication.  Mastication time decreased with placement of dentures, but pt still required prolonged oral phase and oral residue was noted even with liquid wash.  Oral phase was more efficient with decreased residue with trial of soft solid.  Recommend mechanical soft diet with thin liquid by cup sip SLP Visit Diagnosis: Dysphagia, oropharyngeal phase (R13.12)    Aspiration Risk  Mild aspiration risk    Diet Recommendation Dysphagia 3 (Mech soft);Thin liquid   Liquid Administration via: Cup Medication Administration: (As tolerated) Supervision: (1:1 assist with feeding) Compensations: Minimize environmental distractions;Small sips/bites;Slow rate;Follow solids with liquid Postural Changes: Seated upright at 90 degrees    Other  Recommendations Oral Care Recommendations: Oral care BID   Follow up Recommendations 24 hour supervision/assistance      Frequency and Duration min 2x/week  2 weeks       Prognosis Prognosis for Safe Diet Advancement: Fair Barriers to Reach Goals: Cognitive deficits      Swallow Study   General Date of Onset: 12/23/17 HPI: Thomas Hooper is a 84 y.o. male with medical history significant of HTN, systolic CHF, chronic kidney disease stage III, mild dementia, s/p B THA, and s/p B TKA.  He was bending over in his garden when he had immediate left hip pain and fell.  He was taken to [facility] and found to have a  dislocated L hip. Attempts at closed reduction failed at [facility]. Pt was, therefore, transfered to [facility] and underwent successful closed reduction in the OR 12-23-17. Pt with reported code blue and possible seizure activity on 7/21. Pt has been having difficulty swallowing with coughing noted by staff when taking pills with water Type of Study: Bedside Swallow Evaluation Diet Prior to this Study: Regular Temperature Spikes Noted: No History of Recent  Intubation: No Behavior/Cognition: Alert;Cooperative;Pleasant mood Oral Cavity Assessment: Within Functional Limits Oral Care Completed by SLP: No Oral Cavity - Dentition: Dentures, top;Adequate natural dentition Self-Feeding Abilities: Total assist Patient Positioning: Upright in bed Baseline Vocal Quality: Low vocal intensity Volitional Cough: Cognitively unable to elicit Volitional Swallow: Unable to elicit    Oral/Motor/Sensory Function Overall Oral Motor/Sensory Function: (Unable to assess 2/2 AMS)   Ice Chips Ice chips: Impaired Presentation: Spoon Oral Phase Impairments: Poor awareness of bolus Oral Phase Functional Implications: Oral holding Pharyngeal Phase Impairments: (No pharyngeal swallow reflex palpated) Other Comments: "That feels good"   Thin Liquid Thin Liquid: Impaired Presentation: Straw;Spoon;Cup Pharyngeal  Phase Impairments: Throat Clearing - Immediate;Cough - Delayed;Wet Vocal Quality Other Comments: clinical s/s noted with straw only    Nectar Thick Nectar Thick Liquid: Not tested   Honey Thick Honey Thick Liquid: Not tested   Puree Puree: Within functional limits Presentation: Spoon Other Comments: pt expectorated some of bolus. Stated he does not like applesauce   Solid     Solid: Impaired Oral Phase Impairments: Impaired mastication Oral Phase Functional Implications: Prolonged oral transit;Oral residue      Allannah Kempen E Saket Hellstrom, MA, CCC-SLP 12/29/2017,4:36 PM

## 2017-12-29 NOTE — Progress Notes (Signed)
Interim Note. No charge.   Was consulted for seizure and stroke rule out.  MRI ordered, not performed . Repeated CT head- negative for acute infarct.   Neurology will sign off.

## 2017-12-30 DIAGNOSIS — Z9181 History of falling: Secondary | ICD-10-CM | POA: Diagnosis not present

## 2017-12-30 DIAGNOSIS — Z7401 Bed confinement status: Secondary | ICD-10-CM | POA: Diagnosis not present

## 2017-12-30 DIAGNOSIS — N179 Acute kidney failure, unspecified: Secondary | ICD-10-CM | POA: Diagnosis not present

## 2017-12-30 DIAGNOSIS — Z7409 Other reduced mobility: Secondary | ICD-10-CM | POA: Diagnosis not present

## 2017-12-30 DIAGNOSIS — F329 Major depressive disorder, single episode, unspecified: Secondary | ICD-10-CM | POA: Diagnosis not present

## 2017-12-30 DIAGNOSIS — I429 Cardiomyopathy, unspecified: Secondary | ICD-10-CM | POA: Diagnosis not present

## 2017-12-30 DIAGNOSIS — F0390 Unspecified dementia without behavioral disturbance: Secondary | ICD-10-CM

## 2017-12-30 DIAGNOSIS — I480 Paroxysmal atrial fibrillation: Secondary | ICD-10-CM | POA: Diagnosis not present

## 2017-12-30 DIAGNOSIS — I214 Non-ST elevation (NSTEMI) myocardial infarction: Secondary | ICD-10-CM | POA: Diagnosis not present

## 2017-12-30 DIAGNOSIS — R5383 Other fatigue: Secondary | ICD-10-CM | POA: Diagnosis not present

## 2017-12-30 DIAGNOSIS — K449 Diaphragmatic hernia without obstruction or gangrene: Secondary | ICD-10-CM | POA: Diagnosis not present

## 2017-12-30 DIAGNOSIS — R569 Unspecified convulsions: Secondary | ICD-10-CM

## 2017-12-30 DIAGNOSIS — S73005A Unspecified dislocation of left hip, initial encounter: Secondary | ICD-10-CM

## 2017-12-30 DIAGNOSIS — R0789 Other chest pain: Secondary | ICD-10-CM | POA: Diagnosis not present

## 2017-12-30 DIAGNOSIS — R079 Chest pain, unspecified: Secondary | ICD-10-CM | POA: Diagnosis not present

## 2017-12-30 DIAGNOSIS — I959 Hypotension, unspecified: Secondary | ICD-10-CM | POA: Diagnosis not present

## 2017-12-30 DIAGNOSIS — N183 Chronic kidney disease, stage 3 (moderate): Secondary | ICD-10-CM | POA: Diagnosis not present

## 2017-12-30 DIAGNOSIS — R262 Difficulty in walking, not elsewhere classified: Secondary | ICD-10-CM | POA: Diagnosis not present

## 2017-12-30 DIAGNOSIS — Z9889 Other specified postprocedural states: Secondary | ICD-10-CM | POA: Diagnosis not present

## 2017-12-30 DIAGNOSIS — R0902 Hypoxemia: Secondary | ICD-10-CM

## 2017-12-30 DIAGNOSIS — G9341 Metabolic encephalopathy: Secondary | ICD-10-CM | POA: Diagnosis not present

## 2017-12-30 DIAGNOSIS — I639 Cerebral infarction, unspecified: Secondary | ICD-10-CM | POA: Diagnosis not present

## 2017-12-30 DIAGNOSIS — I2510 Atherosclerotic heart disease of native coronary artery without angina pectoris: Secondary | ICD-10-CM | POA: Diagnosis not present

## 2017-12-30 DIAGNOSIS — I700 Atherosclerosis of aorta: Secondary | ICD-10-CM | POA: Diagnosis not present

## 2017-12-30 DIAGNOSIS — F419 Anxiety disorder, unspecified: Secondary | ICD-10-CM | POA: Diagnosis not present

## 2017-12-30 DIAGNOSIS — X58XXXA Exposure to other specified factors, initial encounter: Secondary | ICD-10-CM | POA: Diagnosis not present

## 2017-12-30 DIAGNOSIS — Z96643 Presence of artificial hip joint, bilateral: Secondary | ICD-10-CM | POA: Diagnosis not present

## 2017-12-30 DIAGNOSIS — I491 Atrial premature depolarization: Secondary | ICD-10-CM | POA: Diagnosis not present

## 2017-12-30 DIAGNOSIS — I130 Hypertensive heart and chronic kidney disease with heart failure and stage 1 through stage 4 chronic kidney disease, or unspecified chronic kidney disease: Secondary | ICD-10-CM | POA: Diagnosis not present

## 2017-12-30 DIAGNOSIS — R2689 Other abnormalities of gait and mobility: Secondary | ICD-10-CM | POA: Diagnosis not present

## 2017-12-30 DIAGNOSIS — G8929 Other chronic pain: Secondary | ICD-10-CM | POA: Diagnosis not present

## 2017-12-30 DIAGNOSIS — E039 Hypothyroidism, unspecified: Secondary | ICD-10-CM | POA: Diagnosis not present

## 2017-12-30 DIAGNOSIS — I5022 Chronic systolic (congestive) heart failure: Secondary | ICD-10-CM | POA: Diagnosis not present

## 2017-12-30 DIAGNOSIS — E785 Hyperlipidemia, unspecified: Secondary | ICD-10-CM | POA: Diagnosis not present

## 2017-12-30 DIAGNOSIS — I5020 Unspecified systolic (congestive) heart failure: Secondary | ICD-10-CM | POA: Diagnosis not present

## 2017-12-30 DIAGNOSIS — R278 Other lack of coordination: Secondary | ICD-10-CM | POA: Diagnosis not present

## 2017-12-30 DIAGNOSIS — R1312 Dysphagia, oropharyngeal phase: Secondary | ICD-10-CM | POA: Diagnosis not present

## 2017-12-30 DIAGNOSIS — Z515 Encounter for palliative care: Secondary | ICD-10-CM

## 2017-12-30 DIAGNOSIS — I6782 Cerebral ischemia: Secondary | ICD-10-CM | POA: Diagnosis not present

## 2017-12-30 DIAGNOSIS — S73005D Unspecified dislocation of left hip, subsequent encounter: Secondary | ICD-10-CM

## 2017-12-30 DIAGNOSIS — S2231XA Fracture of one rib, right side, initial encounter for closed fracture: Secondary | ICD-10-CM | POA: Diagnosis not present

## 2017-12-30 DIAGNOSIS — D649 Anemia, unspecified: Secondary | ICD-10-CM | POA: Diagnosis not present

## 2017-12-30 DIAGNOSIS — Z79899 Other long term (current) drug therapy: Secondary | ICD-10-CM | POA: Diagnosis not present

## 2017-12-30 DIAGNOSIS — M6281 Muscle weakness (generalized): Secondary | ICD-10-CM | POA: Diagnosis not present

## 2017-12-30 DIAGNOSIS — I4891 Unspecified atrial fibrillation: Secondary | ICD-10-CM | POA: Diagnosis not present

## 2017-12-30 DIAGNOSIS — J90 Pleural effusion, not elsewhere classified: Secondary | ICD-10-CM | POA: Diagnosis not present

## 2017-12-30 DIAGNOSIS — R45851 Suicidal ideations: Secondary | ICD-10-CM | POA: Diagnosis not present

## 2017-12-30 DIAGNOSIS — Z4789 Encounter for other orthopedic aftercare: Secondary | ICD-10-CM | POA: Diagnosis not present

## 2017-12-30 DIAGNOSIS — S2249XD Multiple fractures of ribs, unspecified side, subsequent encounter for fracture with routine healing: Secondary | ICD-10-CM | POA: Diagnosis not present

## 2017-12-30 DIAGNOSIS — Z7189 Other specified counseling: Secondary | ICD-10-CM

## 2017-12-30 DIAGNOSIS — G319 Degenerative disease of nervous system, unspecified: Secondary | ICD-10-CM | POA: Diagnosis not present

## 2017-12-30 DIAGNOSIS — E871 Hypo-osmolality and hyponatremia: Secondary | ICD-10-CM | POA: Diagnosis not present

## 2017-12-30 DIAGNOSIS — G4489 Other headache syndrome: Secondary | ICD-10-CM | POA: Diagnosis not present

## 2017-12-30 DIAGNOSIS — G4089 Other seizures: Secondary | ICD-10-CM | POA: Diagnosis not present

## 2017-12-30 DIAGNOSIS — M2550 Pain in unspecified joint: Secondary | ICD-10-CM | POA: Diagnosis not present

## 2017-12-30 DIAGNOSIS — G629 Polyneuropathy, unspecified: Secondary | ICD-10-CM | POA: Diagnosis not present

## 2017-12-30 DIAGNOSIS — N184 Chronic kidney disease, stage 4 (severe): Secondary | ICD-10-CM | POA: Diagnosis not present

## 2017-12-30 DIAGNOSIS — M109 Gout, unspecified: Secondary | ICD-10-CM | POA: Diagnosis not present

## 2017-12-30 LAB — CREATININE, SERUM
Creatinine, Ser: 1.86 mg/dL — ABNORMAL HIGH (ref 0.61–1.24)
GFR calc Af Amer: 35 mL/min — ABNORMAL LOW (ref >60)
GFR, EST NON AFRICAN AMERICAN: 30 mL/min — AB (ref >60)

## 2017-12-30 LAB — GLUCOSE, CAPILLARY
GLUCOSE-CAPILLARY: 104 mg/dL — AB (ref 70–99)
GLUCOSE-CAPILLARY: 85 mg/dL (ref 70–99)
Glucose-Capillary: 87 mg/dL (ref 70–99)
Glucose-Capillary: 123 mg/dL — ABNORMAL HIGH (ref 70–99)

## 2017-12-30 MED ORDER — AMLODIPINE BESYLATE 5 MG PO TABS: 5.0000 mg | ORAL_TABLET | Freq: Every day | ORAL | 0 refills | Status: DC

## 2017-12-30 NOTE — Consult Note (Signed)
            [facility] CM Primary Care Navigator  12/30/2017  Nasim Habeeb. 29-Mar-1925 [id number]   [name] to see patientinthe roomto identify possible discharge needs buthe wasalreadydischarge per RN report.  Patientwas discharged today to skilled nursing facility per therapy recommendation (SNF).  Per chart review,patient was admitted to [facility] after having a fall at home, noted severe hip pain and was found to have left hip dislocated. (status post closed reduction of dislocated left total hip prosthesis)  Patient has discharge instruction to follow-up withprimary care provider and cardiology in 1 day after discharge.   For additional questions please contact:  Edwena Felty A. Torell Minder, BSN, RN-BC [facility] PRIMARY CARE Navigator Cell: [phone]

## 2017-12-30 NOTE — Clinical Social Work Note (Signed)
Clinical Social Worker facilitated patient discharge including contacting patient family and facility to confirm patient discharge plans.  Clinical information faxed to facility and family agreeable with plan.  CSW arranged ambulance transport via [facility] to  [facility] [place].  RN to call [phone] for report prior to discharge.  Clinical Social Worker will sign off for now as social work intervention is no longer needed. Please consult [place] again if new need arises.  Loletha Grayer, MSW [phone]

## 2017-12-30 NOTE — Consult Note (Signed)
Consultation Note Date: 12/30/2017   Patient Name: Thomas Hooper.  DOB: [birth year]  MRN: [id number]  Age / Sex: 84 y.o., male  PCP: Townsend Roger, MD Referring Physician: Kayleen Memos, DO  Reason for Consultation: Establishing goals of care  HPI/Patient Profile: 84 y.o. male admitted on 12/23/2017 from [facility].  He has a past medical history of atrial fibrillation, chronic kidney disease stage III, hypertension, dementia, neuropathy, thyroid disease, bilateral hip replacement, bilateral knee replacements, and TIA.  Patient was taken to [place] on 12/22/17 with complaints of left hip pain.  Per reports patient was bending over his garden when he had an immediate left hip pain and fell.  During his course at [facility] he underwent cervical ED closed reduction attempts which were unsuccessful.  He was also taken to the OR for an attempt which was also unsuccessful and later transferred to [facility] for further care.  Patient was a poor historian.  He was alert but confused and unable to provide much history.  Patient did recall that he fell at home.  He denies fever, chills, chest pains, or loss of consciousness.  His granddaughter [name] was at the bedside and provided history.  Since admission patient has underwent closed reduction of left hip.  He also experienced a seizure and has been followed by neurology as well.  Palliative medicine team consulted for goals of care discussion.  Clinical Assessment and Goals of Care: I have reviewed medical records including lab results, imaging, Epic notes, and MAR, received report from the bedside RN, and assessed the patient. I then met at the bedside with patient's granddaughter/POA, [name] to discuss diagnosis prognosis, GOC, EOL wishes, disposition and options.  Patient is lying in bed asleep.  He is easily awakened however he remains somewhat pleasantly  confused.  Patient unable to appropriately engage in goals of care discussion with his granddaughter and I.  I introduced Palliative Medicine as specialized medical care for people living with serious illness. It focuses on providing relief from the symptoms and stress of a serious illness. The goal is to improve quality of life for both the patient and the family.  We discussed a brief life review of the patient.  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  She is ultimately his primary caregiver.  She reports her mother passed away on last year.  Patient is of Christian faith.  He enjoys spending time with his family, sitting outside on the porch, and watching sports on TV.  As far as functional and nutritional status.  Granddaughter states despite patient having dementia he was pretty functional prior to admission.  He had intermittent confusion however he was able to live by himself and perform all ADLs without assistance.  She reports she will come by and stay with him every afternoon and sometimes during the night.  She also did majority of his preparation and grocery shopping.  Daughter reports he was ambulatory without assistive devices.  She reports that he had complained of some left  hip pain and leg pain over the past couple of weeks which he took Tylenol for.  She feels as though when he was out working in his garden that his leg may have given out.  We discussed his current illness and what it means in the larger context of his on-going co-morbidities.  Natural disease trajectory and expectations at EOL were discussed.  Granddaughter is hopeful that patient will begin to show some signs of improvement.  She reports that his mental status and appetite has somewhat improved over the past 24 hours.  She is aware of his current age and ongoing comorbidities.  She continues to remain hopeful for the best but is prepared for the worse.  Her goal is for him to be able to participate in  rehabilitation while at SNF and hopefully return home.  I attempted to elicit values and goals of care important to the patient.    Advanced directives, concepts specific to code status, artifical feeding and hydration, and rehospitalization were considered and discussed.  Granddaughter states patient does have a living will and she is his  HCPOA.  We discussed his current code status, which is full code. She states patient has only said that he would not want to be in a vegetative state but has never voiced any further concerns.  He is not prepared to make him a DNR/DNI.  She states may be if his mental status improves she can have a further conversation with him but at this time she would like to continue with full code and full aggressive measures.  She expresses her concern with changing him to a DNR status in regards to patient recent hospital events where he experienced a seizure and was coded.  She states she can only think that if she had placed a DNR that patient may have passed away without any form of intervention.  It is hard for her to make this decision knowing that he is now getting better and again if she has chosen to make him a DNR he would not have had this chance.  Hospice and Palliative Care services outpatient were explained and offered.  We discussed the difference between both services.  Granddaughter is aware that patient is not eligible for hospice outpatient services given their goal is for him to be placed at [facility] for rehabilitation.  Granddaughter does verbalize her wishes for patient to be followed outpatient with palliative services with awareness that if she wishes to transition patient to hospice she can discuss with outpatient palliative team and they can assist with this transition.  Questions and concerns were addressed. The family was encouraged to call with questions or concerns.  PMT will continue to support holistically.  Primary decision maker: NEXT OF  KIN-granddaughter, [name]   SUMMARY OF RECOMMENDATIONS    Full code-at granddaughter's request  Continue to treat the treatable including aggressive measures.  Granddaughter is hopeful that patient will regain some strength and mental status will somewhat improve.  She is aware of the course of dementia and his other comorbidities.  She states plan is for patient to be placed at [facility] for rehabilitation.  She would also like patient to be followed by outpatient palliative services.  Social work consult for outpatient palliative services at discharge.  Discussed with Shelton Silvas, [place].  Palliative care team will continue to support patient, patient's family, and medical team during hospitalization.  Code Status/Advance Care Planning:  Full code  Palliative Prophylaxis:   Aspiration, Bowel Regimen, Delirium  Protocol, Frequent Pain Assessment, Oral Care, Palliative Wound Care and Turn Reposition  Additional Recommendations (Limitations, Scope, Preferences): Full Scope Treatment continue to treat the treatable including aggressive measures.  Psycho-social/Spiritual:   Desire for further Chaplaincy support: No  Prognosis:   Unable to determine-guarded to poor in the setting of dementia, deconditioning, decreased immobility, poor p.o. intake, atrial fibrillation, chronic kidney disease stage III, status post closed reduction of dislocated total hip prosthesis, seizures, recent cardiac arrest, and hypertension.  Discharge Planning: [facility] for rehab with Palliative care service follow-up      Primary Diagnoses: Present on Admission: . (Resolved) Hip dislocation, left ([place]) . Paroxysmal atrial fibrillation (HCC) . CKD (chronic kidney disease) stage 3, GFR 30-59 ml/min (HCC) . Dementia   I have reviewed the medical record, interviewed the patient and family, and examined the patient. The following aspects are pertinent.  Past Medical History:  Diagnosis Date    . Atrial fibrillation ([place])   . Chronic kidney disease    Stage 3  . Dementia   . Hypertension   . Neuropathy   . Thyroid disease   . TIA (transient ischemic attack)    Social History   Socioeconomic History  . Marital status: Widowed    Spouse name: Not on file  . Number of children: Not on file  . Years of education: Not on file  . Highest education level: Not on file  Occupational History  . Not on file  Social Needs  . Financial resource strain: Not on file  . Food insecurity:    Worry: Not on file    Inability: Not on file  . Transportation needs:    Medical: Not on file    Non-medical: Not on file  Tobacco Use  . Smoking status: Never Smoker  . Smokeless tobacco: Never Used  Substance and Sexual Activity  . Alcohol use: No  . Drug use: No  . Sexual activity: Not on file  Lifestyle  . Physical activity:    Days per week: Not on file    Minutes per session: Not on file  . Stress: Not on file  Relationships  . Social connections:    Talks on phone: Not on file    Gets together: Not on file    Attends religious service: Not on file    Active member of club or organization: Not on file    Attends meetings of clubs or organizations: Not on file    Relationship status: Not on file  Other Topics Concern  . Not on file  Social History Narrative  . Not on file   Family History  Problem Relation Age of Onset  . Cancer Father   . CAD Brother    Scheduled Meds: . amLODipine  5 mg Oral Daily  . aspirin EC  81 mg Oral Daily  . atorvastatin  10 mg Oral q1800  . chlorhexidine  15 mL Mouth Rinse BID  . docusate sodium  100 mg Oral BID  . enoxaparin (LOVENOX) injection  30 mg Subcutaneous Q24H  . gabapentin  600 mg Oral QID  . levothyroxine  25 mcg Oral QAC breakfast  . lidocaine  1 patch Transdermal Q24H  . mouth rinse  15 mL Mouth Rinse q12n4p  . metoprolol tartrate  50 mg Oral BID  . sertraline  50 mg Oral QHS   Continuous Infusions: . sodium chloride      PRN Meds:.sodium chloride, ondansetron **OR** ondansetron (ZOFRAN) IV Medications Prior to Admission:  Prior to Admission medications   Medication Sig Start Date End Date Taking? Authorizing Provider  allopurinol (ZYLOPRIM) 100 MG tablet Take 100 mg by mouth daily.  04/28/16  Yes [provider]  aspirin EC 81 MG tablet Take 81 mg by mouth daily.   Yes [provider]  atorvastatin (LIPITOR) 10 MG tablet Take 10 mg by mouth daily.   Yes [provider]  Calcium Carb-Cholecalciferol (CALCIUM 600/VITAMIN D3 PO) Take 1 tablet by mouth daily.   Yes [provider]  cyanocobalamin 1000 MCG tablet Take 1,000 mcg by mouth daily.   Yes [provider]  FEROSUL 325 (65 Fe) MG tablet Take 325 mg by mouth every morning. 11/21/17  Yes [provider]  gabapentin (NEURONTIN) 600 MG tablet Take 600 mg by mouth 4 (four) times daily.   Yes [provider]  ibuprofen (ADVIL,MOTRIN) 200 MG tablet Take 800 mg by mouth daily.    Yes [provider]  levothyroxine (SYNTHROID, LEVOTHROID) 25 MCG tablet Take 25 mcg by mouth daily before breakfast.  04/21/16  Yes [provider]  metoprolol tartrate (LOPRESSOR) 25 MG tablet Take 12.5 mg by mouth 2 (two) times daily.   Yes [provider]  Omega-3 Fatty Acids (FISH OIL) 1000 MG CAPS Take 1,000 mg by mouth daily.   Yes [provider]  sertraline (ZOLOFT) 25 MG tablet Take 25 mg by mouth at bedtime.    Yes [provider]  amLODipine (NORVASC) 5 MG tablet Take 1 tablet (5 mg total) by mouth daily. 12/31/17   Kayleen Memos, DO   Allergies  Allergen Reactions  . Prednisone Other (See Comments)    Causes Afib  . Morphine And Related Other (See Comments)    Agitation, "made him mean" per granddaughter  . Penicillins Other (See Comments)    Has patient had a PCN reaction causing immediate rash, facial/tongue/throat swelling, SOB or lightheadedness with  hypotension: Unknown Has patient had a PCN reaction causing severe rash involving mucus membranes or skin necrosis: Unknown Has patient had a PCN reaction that required hospitalization: Unknown Has patient had a PCN reaction occurring within the last 10 years: Unknown If all of the above answers are "NO", then may proceed with Cephalosporin use.    Review of Systems  Unable to perform ROS: Dementia    Physical Exam  Constitutional: Vital signs are normal. He has a sickly appearance.  Cardiovascular: Normal rate, regular rhythm, normal heart sounds and normal pulses.  Pulmonary/Chest: Effort normal. He has decreased breath sounds.  Abdominal: Soft. Bowel sounds are normal.  Musculoskeletal:  S/p left hip reduction   Neurological: He is alert. He is disoriented. He displays atrophy.  Skin: Skin is warm, dry and intact. Bruising noted.  Psychiatric: Cognition and memory are impaired. He expresses inappropriate judgment.  Nursing note and vitals reviewed.   Vital Signs: BP (!) 105/48   Pulse 64   Temp 97.7 F (36.5 C) (Oral)   Resp 18   Ht 5' 10"  (1.778 m)   Wt 71.1 kg (156 lb 12 oz)   SpO2 94%   BMI 22.49 kg/m  Pain Scale: 0-10 POSS *See Group Information*: S-Acceptable,Sleep, easy to arouse Pain Score: Asleep   SpO2: SpO2: 94 % O2 Device:SpO2: 94 % O2 Flow Rate: .O2 Flow Rate (L/min): 1 L/min  IO: Intake/output summary:   Intake/Output Summary (Last 24 hours) at 12/30/2017 1211 Last data filed at 12/30/2017 0500 Gross per 24 hour  Intake -  Output  500 ml  Net -500 ml    LBM: Last BM Date: 12/28/17 Baseline Weight: Weight: 68.5 kg (151 lb 0.2 oz) Most recent weight: Weight: 71.1 kg (156 lb 12 oz)     Palliative Assessment/Data: PPS 40 %   Time In: 1100 Time Out: 1205 Time Total: 65 min.   Greater than 50%  of this time was spent counseling and coordinating care related to the above assessment and plan.  Signed by:  Alda Lea,  NP-BC Palliative Medicine Team  Phone: [phone] Fax: [phone] Pager: [phone] Amion: Bjorn Pippin    Please contact Palliative Medicine Team phone at [phone] for questions and concerns.  For individual provider: See Shea Evans

## 2017-12-30 NOTE — Clinical Social Work Placement (Signed)
   CLINICAL SOCIAL WORK PLACEMENT  NOTE  Date:  12/30/2017  Patient Details  Name: Thomas Hooper. MRN: [id number] Date of Birth: 14-Apr-1925  Clinical Social Work is seeking post-discharge placement for this patient at the [place] level of care (*CSW will initial, date and re-position this form in  chart as items are completed):      Patient/family provided with [place] Work Department's list of facilities offering this level of care within the geographic area requested by the patient (or if unable, by the patient's family).  Yes   Patient/family informed of their freedom to choose among providers that offer the needed level of care, that participate in Medicare, Medicaid or managed care program needed by the patient, have an available bed and are willing to accept the patient.      Patient/family informed of [facility]'s ownership interest in [facility] and [facility], as well as of the fact that they are under no obligation to receive care at these facilities.  PASRR submitted to EDS on       PASRR number received on 12/26/17     Existing PASRR number confirmed on       FL2 transmitted to all facilities in geographic area requested by pt/family on 12/26/17     FL2 transmitted to all facilities within larger geographic area on       Patient informed that his/her managed care company has contracts with or will negotiate with certain facilities, including the following:        Yes   Patient/family informed of bed offers received.  Patient chooses bed at [facility]     Physician recommends and patient chooses bed at      Patient to be transferred to [facility] on 12/30/17.  Patient to be transferred to facility by [facility]     Patient family notified on 12/30/17 of transfer.  Name of family member notified:  [name]     PHYSICIAN       Additional Comment:     _______________________________________________ Eileen Stanford, LCSW 12/30/2017, 12:58 PM

## 2017-12-30 NOTE — Progress Notes (Signed)
RN notified triad MD on call  about Patient Afib- SVR pauses.Pt is asymptomatic. Will continue to monitor.

## 2017-12-30 NOTE — Discharge Summary (Addendum)
Discharge Summary  Thomas Hooper. YSA:[id number] DOB: 01/15/1939  PCP: Townsend Roger, MD  Admit date: 12/23/2017 Discharge date: 12/30/2017  Time spent: 25 minutes  Recommendations for Outpatient Follow-up:  1. Follow-up with your PCP 2. Follow-up with orthopedic surgery 3. Follow-up with cardiology 4. Follow-up with neurology 5. Take your medications as prescribed 6. Continue PT at SNF 7. Fall precautions 8. May not drive or operate machinery until cleared by MD or neurology 9. Recommend palliative care assessment and management at SNF  Discharge Diagnoses:  Active Hospital Problems   Diagnosis Date Noted  . Rib fractures 12/25/2017  . Seizure ([place])   . Status post closed reduction of dislocated total hip prosthesis 12/23/2017  . Dementia 02/28/2017  . CKD (chronic kidney disease) stage 3, GFR 30-59 ml/min (HCC) 02/24/2017  . Paroxysmal atrial fibrillation (HCC) 05/19/2016    Resolved Hospital Problems   Diagnosis Date Noted Date Resolved  . Hip dislocation, left [facility]) 12/23/2017 12/25/2017    Discharge Condition: Stable  Diet recommendation: Mechanical soft, thin liquids, one-to-one assistance in feeding.  Vitals:   12/30/17 0500 12/30/17 1045  BP: (!) 105/48 (!) 105/48  Pulse: 64 64  Resp: 18   Temp: 97.7 F (36.5 C)   SpO2: 94%     History of present illness:  Thomas Hooperis a 84 y.o.malewith medical history significant ofHTN, systolic CHF,chronic kidney disease stage III, dementia, who was admitted to [facility] after having a fall at home and noted severe hip pain. He has a history of bilateral hip replacements years ago, bilateral knee replacements. At [facility] ED he was found to have left hip dislocated. Hospitalist at [facility] discussed with Dr. Mee Hives patient was transferred to [facility]. Went successful reduction under anesthesia on 7/19. He was recovering well and undergoing physical therapy and had been previously fully conversant. He had an  unheralded episode of arm flexion eyes rolling unresponsiveness teeth clenching and foaming at the mouth. Episode ended and was accompanied then by unresponsiveness. A brief cardiac arrest code was called. Underwent CPR.  Seen by neurology who felt that this represented a seizure with subsequent Todd's paralysis.  Hospital course complicated by acute metabolic encephalopathy in the setting of dementia, dysphagia with recommendations for dysphasia 3 diet, and persistent A. fib.  PT assessed and recommended SNF.  12/30/2017: Patient seen and examined with his granddaughter daughter at bedside.  He is alert but confused in the setting of dementia.  He has no new complaints.  Denies chest pain, palpitations or dyspnea.  The day of discharge the patient was hemodynamically stable.  Per his granddaughter in the room his mentation is much improved and near his baseline.  He will need to follow-up with his PCP, orthopedic surgery, cardiologist, and neurology post hospitalization.     Hospital Course:  Active Problems:   Paroxysmal atrial fibrillation (HCC)   CKD (chronic kidney disease) stage 3, GFR 30-59 ml/min (HCC)   Dementia   Status post closed reduction of dislocated total hip prosthesis   Seizure (HCC)   Rib fractures  Left hip dislocation status post repair Continue PT as tolerated Out of bed to chair with every shift High risk for fall  New onset seizure Continue gabapentin per neurology No recurrence of seizure  Repeat CT head done on 12/29/2017 revealed no acute intracranial findings but revealed small remote infarcts in the left cerebellum, low right parietal cortex, left paramedian occipital cortex and left caudate head.  Paroxysmal A. fib Rate controlled on metoprolol Not  on oral anticoagulation due to high risk for fall Follow-up with your cardiologist outpatient  Remote L cerebellar infarcts/Scattered infarcts affecting low right parietal cortex, left paramedian occipital  cortex and left caudate head LDL at goal Speech therapy recommended dysphagia 3 diet PT recommends SNF Continue aspirin Lipitor  Recent cardiac arrest s/p CPR Hx of Paroxysmal afib Twelve-lead EKG revealed persistent A. fib  Accelerated HTN, stable Continue metoprolol 12.5 mg BID Avoid aceI or ARB due to worsening cr 2.45  Acute metabolic encephalopathy, most likely multifactorial Possibly secondary to uremia vs remote infarcts Appears to be back to his baseline per his granddaughter in the room Reorient as needed Fall precautions  Cardiomyopathy/chronic systolic CHF Last 2D echo 12/26/17 revealed EF of 40 to 45% Avoid IV fluid hydration if possible Strict I's and O's Daily weight Follow up with cardiology outpatient Dr Jenne Campus   Worsening CKD 4 Baseline creatinine 1.6 GFR 35 Cr 1.86 from 1.83  Avoid nephrotoxic agents/hypotension  Elevated troponin Most likely demand ischemia Troponins trended down and peaked at 0.41  Chronic depression Continue Zoloft     Procedures:  None  Consultations:  Neurology  Discharge Exam: BP (!) 105/48   Pulse 64   Temp 97.7 F (36.5 C) (Oral)   Resp 18   Ht 5\' 10"  (1.778 m)   Wt 71.1 kg (156 lb 12 oz)   SpO2 94%   BMI 22.49 kg/m  . General: 84 y.o. year-old male well developed well nourished in no acute distress.  Alert and confused in the setting of dementia. . Cardiovascular: Irregular rate and rhythm with no rubs or gallops.  No thyromegaly or JVD noted.   Marland Kitchen Respiratory: Clear to auscultation with no wheezes or rales. Good inspiratory effort. . Abdomen: Soft nontender nondistended with normal bowel sounds x4 quadrants. . Musculoskeletal: No lower extremity edema. 2/4 pulses in all 4 extremities. Marland Kitchen Psychiatry: Mood is appropriate for condition and setting  Discharge Instructions You were cared for by a hospitalist during your hospital stay. If you have any questions about your discharge medications  or the care you received while you were in the hospital after you are discharged, you can call the unit and asked to speak with the hospitalist on call if the hospitalist that took care of you is not available. Once you are discharged, your primary care physician will handle any further medical issues. Please note that NO REFILLS for any discharge medications will be authorized once you are discharged, as it is imperative that you return to your primary care physician (or establish a relationship with a primary care physician if you do not have one) for your aftercare needs so that they can reassess your need for medications and monitor your lab values.   Allergies as of 12/30/2017      Reactions   Prednisone Other (See Comments)   Causes Afib   Morphine And Related Other (See Comments)   Agitation, "made him mean" per granddaughter   Penicillins Other (See Comments)   Has patient had a PCN reaction causing immediate rash, facial/tongue/throat swelling, SOB or lightheadedness with hypotension: Unknown Has patient had a PCN reaction causing severe rash involving mucus membranes or skin necrosis: Unknown Has patient had a PCN reaction that required hospitalization: Unknown Has patient had a PCN reaction occurring within the last 10 years: Unknown If all of the above answers are "NO", then may proceed with Cephalosporin use.      Medication List    STOP taking these  medications   ibuprofen 200 MG tablet Commonly known as:  ADVIL,MOTRIN     TAKE these medications   allopurinol 100 MG tablet Commonly known as:  ZYLOPRIM Take 100 mg by mouth daily.   amLODipine 5 MG tablet Commonly known as:  NORVASC Take 1 tablet (5 mg total) by mouth daily. Start taking on:  12/31/2017   aspirin EC 81 MG tablet Take 81 mg by mouth daily.   atorvastatin 10 MG tablet Commonly known as:  LIPITOR Take 10 mg by mouth daily.   CALCIUM 600/VITAMIN D3 PO Take 1 tablet by mouth daily.   cyanocobalamin  1000 MCG tablet Take 1,000 mcg by mouth daily.   FEROSUL 325 (65 FE) MG tablet Generic drug:  ferrous sulfate Take 325 mg by mouth every morning.   Fish Oil 1000 MG Caps Take 1,000 mg by mouth daily.   gabapentin 600 MG tablet Commonly known as:  NEURONTIN Take 600 mg by mouth 4 (four) times daily.   levothyroxine 25 MCG tablet Commonly known as:  SYNTHROID, LEVOTHROID Take 25 mcg by mouth daily before breakfast.   metoprolol tartrate 25 MG tablet Commonly known as:  LOPRESSOR Take 12.5 mg by mouth 2 (two) times daily.   sertraline 25 MG tablet Commonly known as:  ZOLOFT Take 25 mg by mouth at bedtime.      Allergies  Allergen Reactions  . Prednisone Other (See Comments)    Causes Afib  . Morphine And Related Other (See Comments)    Agitation, "made him mean" per granddaughter  . Penicillins Other (See Comments)    Has patient had a PCN reaction causing immediate rash, facial/tongue/throat swelling, SOB or lightheadedness with hypotension: Unknown Has patient had a PCN reaction causing severe rash involving mucus membranes or skin necrosis: Unknown Has patient had a PCN reaction that required hospitalization: Unknown Has patient had a PCN reaction occurring within the last 10 years: Unknown If all of the above answers are "NO", then may proceed with Cephalosporin use.     Contact information for follow-up providers    Swinteck, Aaron Edelman, MD. Schedule an appointment as soon as possible for a visit in 2 weeks.   Specialty:  Orthopedic Surgery Contact information: [address] [place] [address] [phone]        Townsend Roger, MD. Call in 1 day(s).   Specialty:  Internal Medicine Why:  Please call for an appointment. Contact information: [address]  [place] [address] [phone]        Park Liter, MD. Call in 1 day(s).   Specialty:  Cardiology Why:  Please call for an appointment. Contact information: [address] [place] [address] [phone]            Contact information for after-discharge care    Destination    [facility] [place] Preferred SNF .   Service:  Skilled Nursing Contact information: [address] [place] [phone]                   The results of significant diagnostics from this hospitalization (including imaging, microbiology, ancillary and laboratory) are listed below for reference.    Significant Diagnostic Studies: Ct Head Wo Contrast  Result Date: 12/29/2017 CLINICAL DATA:  Focal neuro deficit with stroke suspected EXAM: CT HEAD WITHOUT CONTRAST TECHNIQUE: Contiguous axial images were obtained from the base of the skull through the vertex without intravenous contrast. COMPARISON:  Four  days ago FINDINGS: Brain: No detected acute infarct. Small remote infarcts again seen in the left cerebellum, low right parietal cortex, left paramedian occipital cortex, and left caudate head. Mild for age cerebral volume loss Vascular: No hyperdense vessel.  Atherosclerotic calcification Skull: Negative Sinuses/Orbits: Chronic left sphenoid sinusitis with sclerotic wall thickening and partial opacification. IMPRESSION: 1. No acute finding. 2. Small remote infarcts as described above. Electronically Signed   By: Monte Fantasia M.D.   On: 12/29/2017 08:41   Ct Head Wo Contrast  Result Date: 12/25/2017 CLINICAL DATA:  Initial evaluation for acute altered mental status. EXAM: CT HEAD WITHOUT CONTRAST TECHNIQUE: Contiguous axial images were obtained from the base of the skull through the vertex without intravenous contrast. COMPARISON:  Prior CT from 11/25/2017 FINDINGS: Brain: Moderately advanced cerebral and cerebellar atrophy. Moderate chronic small vessel ischemic disease. Remote left cerebellar infarct. Scattered remote lacunar infarcts at the bilateral basal ganglia. No acute intracranial hemorrhage. No acute large vessel  territory infarct. No mass lesion or midline shift. No hydrocephalus. No extra-axial fluid collection. Vascular: No hyperdense vessel. Calcified atherosclerosis at the skull base. Skull: Scalp soft tissues demonstrate no acute abnormality. Calvarium intact. Sinuses/Orbits: Globes and orbital soft tissues demonstrate no acute finding. Chronic left sphenoid sinusitis. Mastoid air cells are largely clear. Other: None. IMPRESSION: 1. No acute intracranial abnormality. 2. Moderately advanced parenchymal volume loss with chronic microvascular ischemic disease and remote infarcts as above. Electronically Signed   By: Jeannine Boga M.D.   On: 12/25/2017 04:24   Dg Pelvis Portable  Result Date: 12/25/2017 CLINICAL DATA:  Status post seizure. Clinical concern for a hip dislocation. EXAM: PORTABLE PELVIS 1-2 VIEWS COMPARISON:  12/23/2017. FINDINGS: Again demonstrated are bilateral hip prostheses. No fracture or dislocation seen. Atheromatous arterial calcifications and lower lumbar spine degenerative changes. Diffuse osteopenia. IMPRESSION: Bilateral hip prostheses without fracture or dislocation. Electronically Signed   By: Claudie Revering M.D.   On: 12/25/2017 12:19   Dg Pelvis Portable  Result Date: 12/23/2017 CLINICAL DATA:  84 year old male status post closed reduction of dislocated left total hip arthroplasty. EXAM: PORTABLE PELVIS 1-2 VIEWS COMPARISON:  [facility] left hip radiographs 12/22/2017. Intraoperative image 16 21 hours today. FINDINGS: Portable AP supine view at 1644 hours. Bilateral total hip arthroplasties appear normally aligned on these AP images. Hardware appears intact. No acute osseous abnormality identified. Aortoiliac and bilateral femoral artery calcified atherosclerosis. Negative visible bowel gas pattern. IMPRESSION: Bilateral total hip arthroplasties with no adverse features. Electronically Signed   By: Genevie Ann M.D.   On: 12/23/2017 16:58   Dg Chest Port 1 View  Result Date:  12/25/2017 CLINICAL DATA:  CPR performed last night. EXAM: PORTABLE CHEST 1 VIEW COMPARISON:  Earlier today. FINDINGS: The cardiac silhouette remains mildly enlarged. A moderate-sized hiatal hernia is again demonstrated. Interval small amount of linear density in the right mid lung zone. Clear left lung. Diffuse osteopenia. Right humeral head prosthesis and mild left shoulder degenerative changes. No fracture or pneumothorax seen. Possible small amount of mediastinal air on the right versus overlying artifacts from skin fold. IMPRESSION: 1. Possible small amount of mediastinal air on the right versus artifact from overlying skin folds. 2. No pneumothorax. 3. Interval minimal linear atelectasis in the right mid lung zone. 4. Stable cardiomegaly and moderate-sized hiatal hernia. Electronically Signed   By: Claudie Revering M.D.   On: 12/25/2017 12:24   Dg Chest Port 1 View  Result Date: 12/25/2017 CLINICAL DATA:  84 year old male with hypoxia. EXAM: PORTABLE CHEST  1 VIEW COMPARISON:  Chest radiograph dated 12/21/2017 and 02/21/2017 comparison is also made to the CT of the abdomen pelvis dated 10/24/2017 FINDINGS: There is no focal consolidation, pleural effusion, or pneumothorax. Minimal left lung base atelectasis. There is a left parasymphyseal hernia. Stable cardiac silhouette. Atherosclerotic calcification of the aorta. Osteopenia with degenerative changes of the spine and partially visualized right shoulder arthroplasty. No acute osseous pathology. IMPRESSION: 1. No acute cardiopulmonary process. 2. Hiatal hernia. Electronically Signed   By: Anner Crete M.D.   On: 12/25/2017 03:33   Dg Hip Operative Unilat W Or W/o Pelvis Left  Result Date: 12/23/2017 CLINICAL DATA:  Closed reduction of the left hip EXAM: OPERATIVE LEFT HIP (WITH PELVIS IF PERFORMED) 1 VIEWS TECHNIQUE: Fluoroscopic spot image(s) were submitted for interpretation post-operatively. COMPARISON:  None. FINDINGS: A single spot AP radiograph of  the left hip is provided for review and demonstrates a left total hip replacement prosthesis. Alignment appears anatomic given solitary AP projection. No definite fracture. Limited visualization of the pelvis demonstrates sequela of contralateral right total hip replacement, incompletely evaluated. Degenerative change of the lower lumbar spine is suspected though incompletely evaluated. Vascular calcifications overlie the pelvis and left thigh. No radiopaque foreign body. IMPRESSION: Post reduction of left total hip replacement without evidence of complication. Electronically Signed   By: Sandi Mariscal M.D.   On: 12/23/2017 16:35    Microbiology: Recent Results (from the past 240 hour(s))  MRSA PCR Screening     Status: None   Collection Time: 12/23/17  3:36 PM  Result Value Ref Range Status   MRSA by PCR NEGATIVE NEGATIVE Final    Comment:        The GeneXpert MRSA Assay (FDA approved for NASAL specimens only), is one component of a comprehensive MRSA colonization surveillance program. It is not intended to diagnose MRSA infection nor to guide or monitor treatment for MRSA infections. Performed at [facility], [place] [address], [place], [place] [address]      Labs: Basic Metabolic Panel: Recent Labs  Lab 12/23/17 1451 12/25/17 0215 12/25/17 0304 12/25/17 2246 12/28/17 0521 12/29/17 0428 12/30/17 0600  NA 140 139  --  137 141 144  --   K 4.5 3.4*  --  4.4 4.4 4.0  --   CL 110 105  --  107 111 114*  --   CO2 22 17*  --  21* 18* 20*  --   GLUCOSE 97 174*  --  108* 144* 118*  --   BUN 24* 27*  --  31* 46* 48*  --   CREATININE 1.65* 1.80*  --  1.64* 1.73* 1.83* 1.86*  CALCIUM 8.6* 8.8*  --  8.3* 8.7* 8.8*  --   MG  --   --  2.0  --   --   --   --   PHOS  --   --  3.1  --   --   --   --    Liver Function Tests: Recent Labs  Lab 12/23/17 1451 12/25/17 0215  AST 21 45*  ALT 14 24  ALKPHOS 56 63  BILITOT 1.1 0.8  PROT 5.6* 6.1*  ALBUMIN 3.1* 3.1*   No results for  input(s): LIPASE, AMYLASE in the last 168 hours. No results for input(s): AMMONIA in the last 168 hours. CBC: Recent Labs  Lab 12/23/17 1451 12/25/17 0215 12/28/17 0521 12/29/17 0428  WBC 10.3 15.5* 9.4 11.0*  NEUTROABS  --   --  8.0*  --  HGB 11.2* 12.4* 11.1* 10.8*  HCT 34.8* 39.9 34.1* 33.4*  MCV 107.1* 110.5* 104.9* 106.0*  PLT 85* 100* 130* 154   Cardiac Enzymes: Recent Labs  Lab 12/25/17 0758 12/25/17 1213 12/25/17 1909  TROPONINI 0.38* 0.41* 0.39*   BNP: BNP (last 3 results) No results for input(s): BNP in the last 8760 hours.  ProBNP (last 3 results) No results for input(s): PROBNP in the last 8760 hours.  CBG: Recent Labs  Lab 12/29/17 1610 12/29/17 2121 12/30/17 0005 12/30/17 0623 12/30/17 0754  GLUCAP 131* 97 104* 85 87       Signed:  Kayleen Memos, MD [facility] 12/30/2017, 11:34 AM

## 2017-12-30 NOTE — Progress Notes (Signed)
Report called to [employer] .  Report given to [facility] (nurse)  Written instructions will be sent with the patient.  Follow up appointments and discharge instructions are available.  [facility] will transport the patient.

## 2018-01-03 DIAGNOSIS — N183 Chronic kidney disease, stage 3 (moderate): Secondary | ICD-10-CM | POA: Diagnosis not present

## 2018-01-03 DIAGNOSIS — R262 Difficulty in walking, not elsewhere classified: Secondary | ICD-10-CM | POA: Diagnosis not present

## 2018-01-03 DIAGNOSIS — I480 Paroxysmal atrial fibrillation: Secondary | ICD-10-CM | POA: Diagnosis not present

## 2018-01-03 DIAGNOSIS — I5020 Unspecified systolic (congestive) heart failure: Secondary | ICD-10-CM | POA: Diagnosis not present

## 2018-01-06 DIAGNOSIS — F329 Major depressive disorder, single episode, unspecified: Secondary | ICD-10-CM | POA: Diagnosis not present

## 2018-01-06 DIAGNOSIS — R45851 Suicidal ideations: Secondary | ICD-10-CM | POA: Diagnosis not present

## 2018-01-09 ENCOUNTER — Other Ambulatory Visit: Payer: Self-pay | Admitting: *Deleted

## 2018-01-09 NOTE — Patient Outreach (Signed)
[facility]) Care Management  01/09/2018  Thomas Hooper. 1925-04-02 [id number]  Onsite visit to facility. Met with Haze Boyden, admissions.  She reports that patient is still working with therapy. They had a meeting with the family and the grand daughter [name] and her spouse, Corene Cornea are going to have patient come live with them to assist with his care. [name].   Patient nor family were in the room upon attempted patient visit.   Plan for follow up with [facility] UM and patient granddaughter to further assess for [facility] community care management needs.   Royetta Crochet. Laymond Purser, RN, BSN, [place] [phone]) Business Cell  [phone]) Toll Free Office

## 2018-01-10 DIAGNOSIS — Z79899 Other long term (current) drug therapy: Secondary | ICD-10-CM | POA: Diagnosis not present

## 2018-01-10 DIAGNOSIS — E871 Hypo-osmolality and hyponatremia: Secondary | ICD-10-CM | POA: Diagnosis not present

## 2018-01-10 DIAGNOSIS — I5020 Unspecified systolic (congestive) heart failure: Secondary | ICD-10-CM | POA: Diagnosis not present

## 2018-01-10 DIAGNOSIS — N179 Acute kidney failure, unspecified: Secondary | ICD-10-CM | POA: Diagnosis not present

## 2018-01-10 DIAGNOSIS — N183 Chronic kidney disease, stage 3 (moderate): Secondary | ICD-10-CM | POA: Diagnosis not present

## 2018-01-12 ENCOUNTER — Other Ambulatory Visit: Payer: Self-pay | Admitting: *Deleted

## 2018-01-12 NOTE — Patient Outreach (Signed)
[facility]) Care Management  01/12/2018  Thomas Hooper. 1924-12-14 [id number]   Reached out to patient's granddaughter [name] to follow up on [facility] information left at patient's bedside.  Granddaughter thankful for the call. [name] stated she looked over packet was very interested in [facility] services. Explained [facility] CM services. [name] stated she was concerned about taking patient home related to her having to work and patient being a 2 person assist to get up.  [name] requested to speak with [facility] LCSW to discuss caregiver resources prior to her grandfather leaving facility.  Placed a referral for [facility] social work to follow up with granddaughter while patient remains at the facility and for community care manager to follow up at post acute facility discharge.  Will collaborate with [facility].  Will collaborate with [facility] LCSW.  For questions or concerns please contact:  Braxton Vantrease RN, [place] Acute Care Coordinator [phone]) Business Mobile [phone]) Toll free office

## 2018-01-13 ENCOUNTER — Other Ambulatory Visit: Payer: Self-pay | Admitting: *Deleted

## 2018-01-13 NOTE — Patient Outreach (Signed)
[facility]) Care Management  01/13/2018  Thomas Hooper. 1924/07/05 [id number]   CSW was able to make (brief) contact with pt's granddaughter who confirmed pt's identity. CSW introduced self and role and reason for call. Per [name], pt is at [facility] rehab [facility] [place] after a fall and hip dislocation. "he coded while in hospital and his rehab progress is slow".  [name] informed CSW she was currently at work and offered to call back later when more convenient. CSW will await callback.   Eduard Clos, MSW, [place] Worker  [place] [phone]

## 2018-01-15 ENCOUNTER — Encounter (HOSPITAL_COMMUNITY): Payer: Self-pay | Admitting: Emergency Medicine

## 2018-01-15 ENCOUNTER — Observation Stay (HOSPITAL_COMMUNITY): Payer: Medicare Other

## 2018-01-15 ENCOUNTER — Emergency Department (HOSPITAL_COMMUNITY): Payer: Medicare Other

## 2018-01-15 ENCOUNTER — Observation Stay (HOSPITAL_COMMUNITY)
Admission: EM | Admit: 2018-01-15 | Discharge: 2018-01-18 | Disposition: A | Discharge status: Home / Self Care | Payer: Medicare Other | Attending: Internal Medicine | Admitting: Internal Medicine

## 2018-01-15 DIAGNOSIS — J90 Pleural effusion, not elsewhere classified: Secondary | ICD-10-CM | POA: Diagnosis not present

## 2018-01-15 DIAGNOSIS — K449 Diaphragmatic hernia without obstruction or gangrene: Secondary | ICD-10-CM | POA: Insufficient documentation

## 2018-01-15 DIAGNOSIS — I2510 Atherosclerotic heart disease of native coronary artery without angina pectoris: Secondary | ICD-10-CM | POA: Insufficient documentation

## 2018-01-15 DIAGNOSIS — Z888 Allergy status to other drugs, medicaments and biological substances status: Secondary | ICD-10-CM | POA: Insufficient documentation

## 2018-01-15 DIAGNOSIS — Z8249 Family history of ischemic heart disease and other diseases of the circulatory system: Secondary | ICD-10-CM | POA: Insufficient documentation

## 2018-01-15 DIAGNOSIS — I5022 Chronic systolic (congestive) heart failure: Secondary | ICD-10-CM | POA: Insufficient documentation

## 2018-01-15 DIAGNOSIS — I6782 Cerebral ischemia: Secondary | ICD-10-CM | POA: Insufficient documentation

## 2018-01-15 DIAGNOSIS — I480 Paroxysmal atrial fibrillation: Secondary | ICD-10-CM | POA: Diagnosis not present

## 2018-01-15 DIAGNOSIS — E785 Hyperlipidemia, unspecified: Secondary | ICD-10-CM | POA: Insufficient documentation

## 2018-01-15 DIAGNOSIS — Z7189 Other specified counseling: Secondary | ICD-10-CM

## 2018-01-15 DIAGNOSIS — X58XXXA Exposure to other specified factors, initial encounter: Secondary | ICD-10-CM | POA: Insufficient documentation

## 2018-01-15 DIAGNOSIS — I700 Atherosclerosis of aorta: Secondary | ICD-10-CM | POA: Insufficient documentation

## 2018-01-15 DIAGNOSIS — G8929 Other chronic pain: Secondary | ICD-10-CM | POA: Insufficient documentation

## 2018-01-15 DIAGNOSIS — Z96642 Presence of left artificial hip joint: Secondary | ICD-10-CM | POA: Insufficient documentation

## 2018-01-15 DIAGNOSIS — S2231XA Fracture of one rib, right side, initial encounter for closed fracture: Secondary | ICD-10-CM | POA: Insufficient documentation

## 2018-01-15 DIAGNOSIS — Z7982 Long term (current) use of aspirin: Secondary | ICD-10-CM | POA: Insufficient documentation

## 2018-01-15 DIAGNOSIS — Z79890 Hormone replacement therapy (postmenopausal): Secondary | ICD-10-CM | POA: Insufficient documentation

## 2018-01-15 DIAGNOSIS — Z515 Encounter for palliative care: Secondary | ICD-10-CM | POA: Diagnosis not present

## 2018-01-15 DIAGNOSIS — Z9889 Other specified postprocedural states: Secondary | ICD-10-CM | POA: Insufficient documentation

## 2018-01-15 DIAGNOSIS — I214 Non-ST elevation (NSTEMI) myocardial infarction: Secondary | ICD-10-CM | POA: Diagnosis not present

## 2018-01-15 DIAGNOSIS — Z9181 History of falling: Secondary | ICD-10-CM | POA: Diagnosis not present

## 2018-01-15 DIAGNOSIS — E039 Hypothyroidism, unspecified: Secondary | ICD-10-CM | POA: Insufficient documentation

## 2018-01-15 DIAGNOSIS — R0789 Other chest pain: Secondary | ICD-10-CM | POA: Diagnosis not present

## 2018-01-15 DIAGNOSIS — M109 Gout, unspecified: Secondary | ICD-10-CM | POA: Insufficient documentation

## 2018-01-15 DIAGNOSIS — Z9049 Acquired absence of other specified parts of digestive tract: Secondary | ICD-10-CM | POA: Insufficient documentation

## 2018-01-15 DIAGNOSIS — D649 Anemia, unspecified: Secondary | ICD-10-CM | POA: Diagnosis not present

## 2018-01-15 DIAGNOSIS — N183 Chronic kidney disease, stage 3 unspecified: Secondary | ICD-10-CM | POA: Diagnosis present

## 2018-01-15 DIAGNOSIS — I130 Hypertensive heart and chronic kidney disease with heart failure and stage 1 through stage 4 chronic kidney disease, or unspecified chronic kidney disease: Secondary | ICD-10-CM | POA: Diagnosis not present

## 2018-01-15 DIAGNOSIS — G629 Polyneuropathy, unspecified: Secondary | ICD-10-CM | POA: Insufficient documentation

## 2018-01-15 DIAGNOSIS — R079 Chest pain, unspecified: Secondary | ICD-10-CM | POA: Diagnosis present

## 2018-01-15 DIAGNOSIS — F0390 Unspecified dementia without behavioral disturbance: Secondary | ICD-10-CM | POA: Insufficient documentation

## 2018-01-15 DIAGNOSIS — Z8673 Personal history of transient ischemic attack (TIA), and cerebral infarction without residual deficits: Secondary | ICD-10-CM | POA: Insufficient documentation

## 2018-01-15 DIAGNOSIS — F419 Anxiety disorder, unspecified: Secondary | ICD-10-CM | POA: Insufficient documentation

## 2018-01-15 DIAGNOSIS — Z880 Allergy status to penicillin: Secondary | ICD-10-CM | POA: Insufficient documentation

## 2018-01-15 DIAGNOSIS — I639 Cerebral infarction, unspecified: Secondary | ICD-10-CM | POA: Insufficient documentation

## 2018-01-15 DIAGNOSIS — Z885 Allergy status to narcotic agent status: Secondary | ICD-10-CM | POA: Insufficient documentation

## 2018-01-15 DIAGNOSIS — R569 Unspecified convulsions: Secondary | ICD-10-CM | POA: Insufficient documentation

## 2018-01-15 DIAGNOSIS — L8990 Pressure ulcer of unspecified site, unspecified stage: Secondary | ICD-10-CM

## 2018-01-15 DIAGNOSIS — Z79899 Other long term (current) drug therapy: Secondary | ICD-10-CM | POA: Insufficient documentation

## 2018-01-15 DIAGNOSIS — G319 Degenerative disease of nervous system, unspecified: Secondary | ICD-10-CM | POA: Insufficient documentation

## 2018-01-15 LAB — BASIC METABOLIC PANEL
ANION GAP: 13 (ref 5–15)
BUN: 37 mg/dL — ABNORMAL HIGH (ref 8–23)
CO2: 23 mmol/L (ref 22–32)
Calcium: 8.1 mg/dL — ABNORMAL LOW (ref 8.9–10.3)
Chloride: 100 mmol/L (ref 98–111)
Creatinine, Ser: 1.81 mg/dL — ABNORMAL HIGH (ref 0.61–1.24)
GFR calc non Af Amer: 31 mL/min — ABNORMAL LOW (ref >60)
GFR, EST AFRICAN AMERICAN: 36 mL/min — AB (ref >60)
Glucose, Bld: 132 mg/dL — ABNORMAL HIGH (ref 70–99)
POTASSIUM: 3.9 mmol/L (ref 3.5–5.1)
SODIUM: 136 mmol/L (ref 135–145)

## 2018-01-15 LAB — I-STAT TROPONIN, ED: Troponin i, poc: 0.12 ng/mL (ref 0.00–0.08)

## 2018-01-15 LAB — CBC
HEMATOCRIT: 34.7 % — AB (ref 39.0–52.0)
HEMOGLOBIN: 10.9 g/dL — AB (ref 13.0–17.0)
MCH: 33.2 pg (ref 26.0–34.0)
MCHC: 31.4 g/dL (ref 30.0–36.0)
MCV: 105.8 fL — ABNORMAL HIGH (ref 78.0–100.0)
Platelets: 167 10*3/uL (ref 150–400)
RBC: 3.28 MIL/uL — AB (ref 4.22–5.81)
RDW: 14.5 % (ref 11.5–15.5)
WBC: 8.2 10*3/uL (ref 4.0–10.5)

## 2018-01-15 MED ORDER — ACETAMINOPHEN 325 MG PO TABS
650.0000 mg | ORAL_TABLET | Freq: Four times a day (QID) | ORAL | Status: DC | PRN
  Administered 2018-01-16: 650 mg via ORAL
  Filled 2018-01-15: qty 2

## 2018-01-15 MED ORDER — SODIUM CHLORIDE 0.9% FLUSH
3.0000 mL | Freq: Two times a day (BID) | INTRAVENOUS | Status: DC
  Administered 2018-01-16 – 2018-01-17 (×4): 3 mL via INTRAVENOUS

## 2018-01-15 MED ORDER — ASPIRIN 81 MG PO CHEW
324.0000 mg | CHEWABLE_TABLET | Freq: Once | ORAL | Status: AC
  Administered 2018-01-15: 324 mg via ORAL
  Filled 2018-01-15: qty 4

## 2018-01-15 MED ORDER — ENOXAPARIN SODIUM 30 MG/0.3ML ~~LOC~~ SOLN
30.0000 mg | SUBCUTANEOUS | Status: DC
  Administered 2018-01-17 – 2018-01-18 (×2): 30 mg via SUBCUTANEOUS
  Filled 2018-01-15 (×3): qty 0.3

## 2018-01-15 MED ORDER — SODIUM CHLORIDE 0.9 % IV SOLN: 250.0000 mL | INTRAVENOUS | Status: DC | PRN

## 2018-01-15 MED ORDER — ACETAMINOPHEN 650 MG RE SUPP: 650.0000 mg | Freq: Four times a day (QID) | RECTAL | Status: DC | PRN

## 2018-01-15 MED ORDER — SODIUM CHLORIDE 0.9% FLUSH: 3.0000 mL | INTRAVENOUS | Status: DC | PRN

## 2018-01-15 NOTE — H&P (Signed)
TRH H&P   Patient Demographics:    Thomas Hooper, is a 84 y.o. male  MRN: [id number]   DOB - [birth year]  Admit Date - 01/15/2018  Outpatient Primary MD for the patient is Townsend Roger, MD  Referring MD/NP/PA:  Dr. Sedonia Small  Outpatient Specialists:    Patient coming from:  [facility] Complaint  Patient presents with  . Chest Pain  . Headache      HPI:    Thomas Hooper  is a 84 y.o. male, w dementia, tia, ckd stage 3, Pafib, apparently c/o chest pain and sob while at [facility].  Pt has dementia so its difficult to tell if chest pain truly present or not.  Pt at current time denies chest pain.   In Ed,   ekg afib at 80, nl axis, slight st depression v5,6  CXR IMPRESSION: Right-sided effusion with underlying atelectatic changes.  Na 136, K 3.9, Bun37 creatinine 1.81  Wbc 8.2, Hgb 10.9, Plt 167 Trop 0.12  Pt will be admitted for chest pain and troponin elevation   Review of systems:    In addition to the HPI above,  No Fever-chills, No Headache, No changes with Vision or hearing, No problems swallowing food or Liquids, No Cough No Abdominal pain, No Nausea or Vommitting, Bowel movements are regular, No Blood in stool or Urine, No dysuria, No new skin rashes or bruises, No new joints pains-aches,  No new weakness, tingling, numbness in any extremity, No recent weight gain or loss, No polyuria, polydypsia or polyphagia, No significant Mental Stressors.  A full 10 point Review of Systems was done, except as stated above, all other Review of Systems were negative.   With Past History of the following :    Past Medical History:  Diagnosis Date  . Atrial fibrillation ([place])   . Chronic kidney disease    Stage 3  . Dementia   . Hypertension   . Neuropathy   . Thyroid disease   . TIA (transient ischemic attack)       Past Surgical History:    Procedure Laterality Date  . BACK SURGERY    . CHOLECYSTECTOMY    . HIP CLOSED REDUCTION Left 12/23/2017   Procedure: CLOSED REDUCTION HIP;  Surgeon: Rod Can, MD;  Location: [place];  Service: Orthopedics;  Laterality: Left;  . HIP SURGERY    . KNEE SURGERY    . LOOP RECORDER INSERTION N/A 05/10/2017   Procedure: LOOP RECORDER INSERTION;  Surgeon: Constance Haw, MD;  Location: [place] [facility];  Service: Cardiovascular;  Laterality: N/A;  . SHOULDER SURGERY        Social History:     Social History   Tobacco Use  . Smoking status: Never Smoker  . Smokeless tobacco: Never Used  Substance Use Topics  . Alcohol use: No     Lives -  at [place] - unclear   Family History :     Family History  Problem Relation Age of Onset  . Cancer Father   . CAD Brother       Home Medications:   Prior to Admission medications   Medication Sig Start Date End Date Taking? Authorizing Provider  allopurinol (ZYLOPRIM) 100 MG tablet Take 100 mg by mouth daily.  04/28/16   [provider]  amLODipine (NORVASC) 5 MG tablet Take 1 tablet (5 mg total) by mouth daily. 12/31/17   Kayleen Memos, DO  aspirin EC 81 MG tablet Take 81 mg by mouth daily.    [provider]  atorvastatin (LIPITOR) 10 MG tablet Take 10 mg by mouth daily.    [provider]  Calcium Carb-Cholecalciferol (CALCIUM 600/VITAMIN D3 PO) Take 1 tablet by mouth daily.    [provider]  cyanocobalamin 1000 MCG tablet Take 1,000 mcg by mouth daily.    [provider]  FEROSUL 325 (65 Fe) MG tablet Take 325 mg by mouth every morning. 11/21/17   [provider]  gabapentin (NEURONTIN) 600 MG tablet Take 600 mg by mouth 4 (four) times daily.    [provider]  levothyroxine (SYNTHROID, LEVOTHROID) 25 MCG tablet Take 25 mcg by mouth daily before breakfast.  04/21/16   [provider]  metoprolol tartrate (LOPRESSOR) 25 MG tablet  Take 12.5 mg by mouth 2 (two) times daily.    [provider]  Omega-3 Fatty Acids (FISH OIL) 1000 MG CAPS Take 1,000 mg by mouth daily.    [provider]  sertraline (ZOLOFT) 25 MG tablet Take 25 mg by mouth at bedtime.     [provider]     Allergies:     Allergies  Allergen Reactions  . Prednisone Other (See Comments)    Causes Afib  . Morphine And Related Other (See Comments)    Agitation, "made him mean" per granddaughter  . Penicillins Other (See Comments)    Has patient had a PCN reaction causing immediate rash, facial/tongue/throat swelling, SOB or lightheadedness with hypotension: Unknown Has patient had a PCN reaction causing severe rash involving mucus membranes or skin necrosis: Unknown Has patient had a PCN reaction that required hospitalization: Unknown Has patient had a PCN reaction occurring within the last 10 years: Unknown If all of the above answers are "NO", then may proceed with Cephalosporin use.      Physical Exam:   Vitals  Blood pressure 114/71, pulse (!) 58, temperature (!) 97.5 F (36.4 C), temperature source Oral, resp. rate 18, height 5\' 10"  (1.778 m), weight 71.1 kg, SpO2 99 %.   1. General  lying in bed in NAD,   2. Normal affect and insight, Not Suicidal or Homicidal, Awake Alert, Oriented X 1  3. No F.N deficits, ALL C.Nerves Intact, Strength 5/5 all 4 extremities, Sensation intact all 4 extremities, Plantars down going.  4. Ears and Eyes appear Normal, Conjunctivae clear, PERRLA. Moist Oral Mucosa.  5. Supple Neck, No JVD, No cervical lymphadenopathy appriciated, No Carotid Bruits.  6. Symmetrical Chest wall movement, Good air movement bilaterally, CTAB.  7. RRR, No Gallops, Rubs or Murmurs, No Parasternal Heave.  8. Positive Bowel Sounds, Abdomen Soft, No tenderness, No organomegaly appriciated,No rebound -guarding or rigidity.  9.  No Cyanosis, Normal Skin Turgor, No Skin Rash or Bruise.  10. Good  muscle tone,  joints appear normal , no effusions, Normal ROM.  11. No  Palpable Lymph Nodes in Neck or Axillae     Data Review:    CBC Recent Labs  Lab 01/15/18 2030  WBC 8.2  HGB 10.9*  HCT 34.7*  PLT 167  MCV 105.8*  MCH 33.2  MCHC 31.4  RDW 14.5   ------------------------------------------------------------------------------------------------------------------  Chemistries  Recent Labs  Lab 01/15/18 2030  NA 136  K 3.9  CL 100  CO2 23  GLUCOSE 132*  BUN 37*  CREATININE 1.81*  CALCIUM 8.1*   ------------------------------------------------------------------------------------------------------------------ estimated creatinine clearance is 26.7 mL/min (A) (by C-G formula based on SCr of 1.81 mg/dL (H)). ------------------------------------------------------------------------------------------------------------------ No results for input(s): TSH, T4TOTAL, T3FREE, THYROIDAB in the last 72 hours.  Invalid input(s): FREET3  Coagulation profile No results for input(s): INR, PROTIME in the last 168 hours. ------------------------------------------------------------------------------------------------------------------- No results for input(s): DDIMER in the last 72 hours. -------------------------------------------------------------------------------------------------------------------  Cardiac Enzymes No results for input(s): CKMB, TROPONINI, MYOGLOBIN in the last 168 hours.  Invalid input(s): CK ------------------------------------------------------------------------------------------------------------------ No results found for: BNP   ---------------------------------------------------------------------------------------------------------------  Urinalysis No results found for: COLORURINE, APPEARANCEUR, LABSPEC, [place], GLUCOSEU, HGBUR, BILIRUBINUR, KETONESUR, PROTEINUR, UROBILINOGEN, NITRITE,  LEUKOCYTESUR  ----------------------------------------------------------------------------------------------------------------   Imaging Results:    Dg Chest 2 View  Result Date: 01/15/2018 CLINICAL DATA:  Central chest pain EXAM: CHEST - 2 VIEW COMPARISON:  12/25/2017 FINDINGS: Cardiac shadow is enlarged in size and stable. Loop recorder is again noted. Right-sided pleural effusion is seen with likely underlying atelectatic changes. No other focal infiltrate is seen. IMPRESSION: Right-sided effusion with underlying atelectatic changes. Electronically Signed   By: Inez Catalina M.D.   On: 01/15/2018 21:24     Assessment & Plan:    Principal Problem:   Chest pain Active Problems:   Paroxysmal atrial fibrillation (HCC)   CKD (chronic kidney disease) stage 3, GFR 30-59 ml/min (HCC)   Dementia   Anemia    Chest pain Tele Trop iq 6h x3 Check cardiac echo  Troponin elevation Cardiology consulted by email  R pleural effusion Check CT chest   Pafib Cont metoprolol Cont Aspirin  Hypertension Cont Amlodipine 5mg  po qday  Hyperlipidemia Cont Lipitor 10mg  po qhs  Gout Cont allopurinol 100mg  po qday  Hypothyroidism Cont levothyroxine 25 micrograms po qday  Anxiety Cont Zoloft 25mg  po qday  Anemia Check cbc in am  CKD stage 3, Check cmp in am  Chronic back pain Cont gabapentin  DVT Prophylaxis Lovenox - SCDs  AM Labs Ordered, also please review Full Orders  Family Communication: Admission, patients condition and plan of care including tests being ordered have been discussed with the patient who indicate understanding and agree with the plan and Code Status.  Code Status  FULL CODE  Likely DC to  home  Condition GUARDED    Consults called:  Cardiology by email  Admission status:    observation  Time spent in minutes : 60   Jani Gravel M.D on 01/15/2018 at 10:14 PM  Between 7am to 7pm - Pager - [phone]. After 7pm go to [email or web address] - password  [facility]  [facility] - Office  [phone]

## 2018-01-15 NOTE — ED Notes (Signed)
Dr Sedonia Small and RN Jorene Guest informed of troponin results .12

## 2018-01-15 NOTE — ED Notes (Signed)
No addl blood,  Pt enroute to inpatient floor.

## 2018-01-15 NOTE — ED Provider Notes (Signed)
[facility] Emergency Department Provider Note MRN:  [id number]  Arrival date & time: 01/16/18     Chief Complaint   Chest Pain and Headache   History of Present Illness   Thomas Hooper. is a 84 y.o. year-old male with a history of A. fib, CKD presenting to the ED with chief complaint of chest pain.  Patient is a poor historian, history obtained from granddaughter.  Patient endorsing sudden onset severe chest pain, constant throughout the day today.  Located in center of the chest, described as a severe pain.  Associated with posterior headache, which she experiences daily.  Expressed shortness of breath with the chest pain.  No recent fever cough, no abdominal pain.  Pain has since resolved.  Review of Systems  A complete 10 system review of systems was obtained and all systems are negative except as noted in the HPI and PMH.   Patient's Health History    Past Medical History:  Diagnosis Date  . Atrial fibrillation ([place])   . Chronic kidney disease    Stage 3  . Dementia   . Hypertension   . Neuropathy   . Thyroid disease   . TIA (transient ischemic attack)     Past Surgical History:  Procedure Laterality Date  . BACK SURGERY    . CHOLECYSTECTOMY    . HIP CLOSED REDUCTION Left 12/23/2017   Procedure: CLOSED REDUCTION HIP;  Surgeon: Rod Can, MD;  Location: [place];  Service: Orthopedics;  Laterality: Left;  . HIP SURGERY    . KNEE SURGERY    . LOOP RECORDER INSERTION N/A 05/10/2017   Procedure: LOOP RECORDER INSERTION;  Surgeon: Constance Haw, MD;  Location: [place] [facility];  Service: Cardiovascular;  Laterality: N/A;  . SHOULDER SURGERY      Family History  Problem Relation Age of Onset  . Cancer Father   . CAD Brother     Social History   Socioeconomic History  . Marital status: Widowed    Spouse name: Not on file  . Number of children: Not on file  . Years of education: Not on file  . Highest education level: Not on file    Occupational History  . Not on file  Social Needs  . Financial resource strain: Not on file  . Food insecurity:    Worry: Not on file    Inability: Not on file  . Transportation needs:    Medical: Not on file    Non-medical: Not on file  Tobacco Use  . Smoking status: Never Smoker  . Smokeless tobacco: Never Used  Substance and Sexual Activity  . Alcohol use: No  . Drug use: No  . Sexual activity: Not on file  Lifestyle  . Physical activity:    Days per week: Not on file    Minutes per session: Not on file  . Stress: Not on file  Relationships  . Social connections:    Talks on phone: Not on file    Gets together: Not on file    Attends religious service: Not on file    Active member of club or organization: Not on file    Attends meetings of clubs or organizations: Not on file    Relationship status: Not on file  . Intimate partner violence:    Fear of current or ex partner: Not on file    Emotionally abused: Not on file    Physically abused: Not on file    Forced  sexual activity: Not on file  Other Topics Concern  . Not on file  Social History Narrative  . Not on file     Physical Exam  Vital Signs and Nursing Notes reviewed Vitals:   01/15/18 2253 01/15/18 2329  BP:  131/69  Pulse:  68  Resp:  16  Temp: 98.7 F (37.1 C) (!) 97 F (36.1 C)  SpO2:  98%    CONSTITUTIONAL: Elderly-appearing, NAD NEURO:  Alert and oriented x 3, no focal deficits EYES:  eyes equal and reactive ENT/NECK:  no LAD, no JVD CARDIO: Regular rate, well-perfused, normal S1 and S2 PULM:  CTAB no wheezing or rhonchi GI/GU:  normal bowel sounds, non-distended, non-tender MSK/SPINE:  No gross deformities, no edema SKIN:  no rash, atraumatic PSYCH:  Appropriate speech and behavior  Diagnostic and Interventional Summary    EKG Interpretation  Date/Time:  Sunday January 15 2018 20:19:41 EDT Ventricular Rate:  82 PR Interval:    QRS Duration: 97 QT Interval:  407 QTC  Calculation: 490 R Axis:   17 Text Interpretation:  Atrial fibrillation Borderline low voltage, extremity leads Nonspecific T abnormalities, inferior leads Borderline prolonged QT interval Confirmed by Gerlene Fee [phone]) on 01/15/2018 9:00:47 PM      Labs Reviewed  BASIC METABOLIC PANEL - Abnormal; Notable for the following components:      Result Value   Glucose, Bld 132 (*)    BUN 37 (*)    Creatinine, Ser 1.81 (*)    Calcium 8.1 (*)    GFR calc non Af Amer 31 (*)    GFR calc Af Amer 36 (*)    All other components within normal limits  CBC - Abnormal; Notable for the following components:   RBC 3.28 (*)    Hemoglobin 10.9 (*)    HCT 34.7 (*)    MCV 105.8 (*)    All other components within normal limits  I-STAT TROPONIN, ED - Abnormal; Notable for the following components:   Troponin i, poc 0.12 (*)    All other components within normal limits  COMPREHENSIVE METABOLIC PANEL  CBC  TROPONIN I  TROPONIN I  TROPONIN I  I-STAT TROPONIN, ED    CT CHEST WO CONTRAST  Final Result    DG Chest 2 View  Final Result      Medications  enoxaparin (LOVENOX) injection 30 mg (has no administration in time range)  sodium chloride flush (NS) 0.9 % injection 3 mL (3 mLs Intravenous Given 01/16/18 0011)  sodium chloride flush (NS) 0.9 % injection 3 mL (has no administration in time range)  0.9 %  sodium chloride infusion (has no administration in time range)  acetaminophen (TYLENOL) tablet 650 mg (has no administration in time range)    Or  acetaminophen (TYLENOL) suppository 650 mg (has no administration in time range)  aspirin chewable tablet 324 mg (324 mg Oral Given 01/15/18 2209)     Procedures Critical Care  ED Course and Medical Decision Making  I have reviewed the triage vital signs and the nursing notes.  Pertinent labs & imaging results that were available during my care of the patient were reviewed by me and considered in my medical decision making (see below for  details).    84 year old male history of A. fib, CKD presenting with central severe chest pain throughout the day.  Vital signs stable, pain resolved upon ED arrival.  EKG with no ischemic changes, first troponin 0 0.12, concerning for NSTEMI.  Not tachycardic, no  leg swelling or pain, satting well on room air, nothing to suggest PE.  Cardiology consulted, admitted for further care and evaluation.  Goals of care discussion was performed with patient and patient's granddaughter in the room.  Patient had expressed desire to "go to heaven" during the most severe parts of the pain today, did not want to be hospitalized.  However in the setting of no pain, patient wishes to have continued medical care.   Barth Kirks. Sedonia Small, [place] [email or web address]  Final Clinical Impressions(s) / ED Diagnoses     ICD-10-CM   1. NSTEMI (non-ST elevated myocardial infarction) [facility]) I21.4     ED Discharge Orders    None         Maudie Flakes, MD 01/16/18 [phone]

## 2018-01-15 NOTE — ED Triage Notes (Signed)
Brought by ems from [facility] for c/o central chest pain.  Describes a sharp.  Given Ntg x 1 via nursing facility with full pain relief reported.  Now only c/o headache.  CBG-150 via ems.  Alert and answering questions in triage.

## 2018-01-15 NOTE — Consult Note (Signed)
[facility] Consult Note   Primary Physician:  Townsend Roger, MD Primary Cardiologist:   Will Meredith Leeds, MD   Reason for Consultation:  Chest pain  HPI:    Thomas Hooper. is seen today for evaluation of chest pain at the request of Dr. Sedonia Small (ED Attending). The patient is a 84 year old gentleman with a past medical history significant for multiple falls, dementia, status post closed reduction of dislocated left hip recently, chronic systolic heart failure, paroxysmal atrial fibrillation, remote CVAs, chronic kidney disease and seizures who is admitted with chest pain.   The patient was brought by EMS from the Rehab facility where he resides. Apparently he had an episode of sharp chest pain that he reported to the nurse. He was given nitroglycerin with unclear response from the medicine. The patient is an extremely poor historian. In the ED he denies any chest pain. He also does not have any shortness of breath, orthopnea or leg edema. Interestingly he had rib fractures on a recent hospitalization in July 2019.  An implantable loop recorder was inserted on 05/10/2017 to document the burden of atrial fibrillation given his past history of TIAs. Last remote device check (12/26/2017): no new AF episodes. No pauses. No tachycardia or bradycardia episodes. The presenting ECG today is atrial fibrillation with controlled rate in the 80s.   Echocardiogram 12/26/2017 - Left ventricle: The cavity size was mildly dilated. There was   mild concentric hypertrophy. Systolic function was mildly to   moderately reduced. The estimated ejection fraction was in the   range of 40% to 45%. - Mitral valve: Calcified annulus. Normal thickness leaflets . - Pulmonary arteries: Systolic pressure was mildly increased. PA   peak pressure: 40 mm Hg (S). Impressions: - Even with Definity contrast, the study quality is poor and   regional wall motion cannot be reliably assessed.      Home  Medications Prior to Admission medications   Medication Sig Start Date End Date Taking? Authorizing Provider  acetaminophen (TYLENOL) 325 MG tablet Take 650 mg by mouth every 6 (six) hours as needed (pain).   Yes [provider]  allopurinol (ZYLOPRIM) 100 MG tablet Take 100 mg by mouth daily.  04/28/16  Yes [provider]  amLODipine (NORVASC) 5 MG tablet Take 1 tablet (5 mg total) by mouth daily. 12/31/17  Yes Kayleen Memos, DO  aspirin EC 81 MG tablet Take 81 mg by mouth daily.   Yes [provider]  atorvastatin (LIPITOR) 10 MG tablet Take 10 mg by mouth every evening.    Yes [provider]  Calcium Carbonate-Vitamin D (CALCIUM 600+D) 600-400 MG-UNIT tablet Take 1 tablet by mouth every evening.   Yes [provider]  docusate (COLACE) 50 MG/5ML liquid Take 100 mg by mouth 2 (two) times daily.   Yes [provider]  ferrous sulfate 325 (65 FE) MG tablet Take 325 mg by mouth daily.   Yes [provider]  furosemide (LASIX) 20 MG tablet Take 20 mg by mouth.   Yes [provider]  gabapentin (NEURONTIN) 600 MG tablet Take 600 mg by mouth every 6 (six) hours.    Yes [provider]  levothyroxine (SYNTHROID, LEVOTHROID) 50 MCG tablet Take 50 mcg by mouth daily at 6 (six) AM.  04/21/16  Yes [provider]  metoprolol tartrate (LOPRESSOR) 25 MG tablet Take 12.5 mg by mouth 2 (two) times daily. 9am, 9pm   Yes [provider]  NUTRITIONAL SUPPLEMENT  LIQD Take 120 mLs by mouth 2 (two) times daily. MedPass   Yes [provider]  nystatin (MYCOSTATIN) 100000 UNIT/ML suspension Use as directed 15 mLs in the mouth or throat See admin instructions. Swish and spit 15 ml every 6 hours as needed for mouth pain   Yes [provider]  Omega-3 Fatty Acids (FISH OIL) 1000 MG CAPS Take 1,000 mg by mouth every evening.    Yes [provider]  sertraline (ZOLOFT) 25 MG tablet Take 25 mg by  mouth at bedtime.    Yes [provider]  vitamin B-12 (CYANOCOBALAMIN) 500 MCG tablet Take 1,000 mcg by mouth daily.    Yes [provider]    Past Medical History: Past Medical History:  Diagnosis Date  . Atrial fibrillation ([place])   . Chronic kidney disease    Stage 3  . Dementia   . Hypertension   . Neuropathy   . Thyroid disease   . TIA (transient ischemic attack)     Past Surgical History: Past Surgical History:  Procedure Laterality Date  . BACK SURGERY    . CHOLECYSTECTOMY    . HIP CLOSED REDUCTION Left 12/23/2017   Procedure: CLOSED REDUCTION HIP;  Surgeon: Rod Can, MD;  Location: [facility];  Service: Orthopedics;  Laterality: Left;  . HIP SURGERY    . KNEE SURGERY    . LOOP RECORDER INSERTION N/A 05/10/2017   Procedure: LOOP RECORDER INSERTION;  Surgeon: Constance Haw, MD;  Location: [facility];  Service: Cardiovascular;  Laterality: N/A;  . SHOULDER SURGERY      Family History: Family History  Problem Relation Age of Onset  . Cancer Father   . CAD Brother     Social History: Social History   Socioeconomic History  . Marital status: Widowed    Spouse name: Not on file  . Number of children: Not on file  . Years of education: Not on file  . Highest education level: Not on file  Occupational History  . Not on file  Social Needs  . Financial resource strain: Not on file  . Food insecurity:    Worry: Not on file    Inability: Not on file  . Transportation needs:    Medical: Not on file    Non-medical: Not on file  Tobacco Use  . Smoking status: Never Smoker  . Smokeless tobacco: Never Used  Substance and Sexual Activity  . Alcohol use: No  . Drug use: No  . Sexual activity: Not on file  Lifestyle  . Physical activity:    Days per week: Not on file    Minutes per session: Not on file  . Stress: Not on file  Relationships  . Social connections:    Talks on phone: Not on file    Gets together: Not on file     Attends religious service: Not on file    Active member of club or organization: Not on file    Attends meetings of clubs or organizations: Not on file    Relationship status: Not on file  Other Topics Concern  . Not on file  Social History Narrative  . Not on file    Allergies:  Allergies  Allergen Reactions  . Prednisone Other (See Comments)    Causes Afib  . Morphine And Related Other (See Comments)    Agitation, "made him mean" per granddaughter  . Penicillins Other (See Comments)    Has patient had a PCN reaction  causing immediate rash, facial/tongue/throat swelling, SOB or lightheadedness with hypotension: Unknown Has patient had a PCN reaction causing severe rash involving mucus membranes or skin necrosis: Unknown Has patient had a PCN reaction that required hospitalization: Unknown Has patient had a PCN reaction occurring within the last 10 years: Unknown If all of the above answers are "NO", then may proceed with Cephalosporin use.     Objective:    Vital Signs:   Temp:  [97.5 F (36.4 C)-98.7 F (37.1 C)] 98.7 F (37.1 C) (08/11 2253) Pulse Rate:  [39-66] 41 (08/11 2215) Resp:  [13-20] 18 (08/11 2245) BP: (114-136)/(71-92) 124/81 (08/11 2245) SpO2:  [98 %-100 %] 100 % (08/11 2230) Weight:  [71.1 kg] 71.1 kg (08/11 2019)    Weight change: Filed Weights   01/15/18 2019  Weight: 71.1 kg    Intake/Output:  No intake or output data in the 24 hours ending 01/15/18 2303    Physical Exam    General:  Well appearing. No resp difficulty HEENT: normal Neck: supple. JVP . Carotids 2+ bilat; no bruits. No lymphadenopathy or thyromegaly appreciated. Cor: PMI nondisplaced. Irregularly irregular. No rubs, gallops or murmurs. Lungs: clear Abdomen: soft, nontender, nondistended. No hepatosplenomegaly. No bruits or masses. Good bowel sounds. Extremities: no cyanosis, clubbing, rash, edema Neuro: alert & orientedx3, cranial nerves grossly intact. moves all 4  extremities w/o difficulty.  Affect pleasant, slightly confused    EKG    01/15/2018: atrial fibrillation, rate 82 bpm, nonspecific T-wave abnormalities, poor R-wave progression  Labs   Basic Metabolic Panel: Recent Labs  Lab 01/15/18 2030  NA 136  K 3.9  CL 100  CO2 23  GLUCOSE 132*  BUN 37*  CREATININE 1.81*  CALCIUM 8.1*    Liver Function Tests: No results for input(s): AST, ALT, ALKPHOS, BILITOT, PROT, ALBUMIN in the last 168 hours. No results for input(s): LIPASE, AMYLASE in the last 168 hours. No results for input(s): AMMONIA in the last 168 hours.  CBC: Recent Labs  Lab 01/15/18 2030  WBC 8.2  HGB 10.9*  HCT 34.7*  MCV 105.8*  PLT 167    Cardiac Enzymes: No results for input(s): CKTOTAL, CKMB, CKMBINDEX, TROPONINI in the last 168 hours.  BNP: BNP (last 3 results) No results for input(s): BNP in the last 8760 hours.  ProBNP (last 3 results) No results for input(s): PROBNP in the last 8760 hours.   CBG: No results for input(s): GLUCAP in the last 168 hours.  Coagulation Studies: No results for input(s): LABPROT, INR in the last 72 hours.   Imaging   Chest X-Ray 2 View - Result Date: 01/15/2018 FINDINGS: Cardiac shadow is enlarged in size and stable. Loop recorder is again noted. Right-sided pleural effusion is seen with likely underlying atelectatic changes. No other focal infiltrate is seen.  IMPRESSION: Right-sided effusion with underlying atelectatic changes.   Echocardiogram 12/26/2017 - Left ventricle: The cavity size was mildly dilated. There was   mild concentric hypertrophy. Systolic function was mildly to   moderately reduced. The estimated ejection fraction was in the   range of 40% to 45%. - Mitral valve: Calcified annulus. Normal thickness leaflets . - Pulmonary arteries: Systolic pressure was mildly increased. PA   peak pressure: 40 mm Hg (S). Impressions: - Even with Definity contrast, the study quality is poor and    regional wall motion cannot be reliably assessed.    Assessment/Plan   1. Chest pain  - Trend cardiac biomarkers and obtain serial ECGs. Initial troponin is  0.12 - Consider aspirin 81 mg daily if considered safe from the standpoint of his recent left hip dislocation. - Continue atorvastatin - The patient is a poor candidate for any invasive strategies (such as cardiac catheterization) due to his frequent falls and history of dementia. Any coronary intervention would entail the use of dual antiplatelet therapy for at least 12 months post-procedure. This would be very difficult to accomplish in his case.   2. Atrial fibrillation  - A LINQ device was previously placed to document the burden of his atrial fibrillation. His presenting ECG today is atrial fibrillation with a controlled rate. - Consider rate control with AV nodal blockers such as metoprolol.  - He is likely not a candidate for rhythm control. - Not on oral anticoagulation due to high risk for falls and dementia. Would hold off on IV heparin until cleared by orthopedics.    3. Chronic systolic heart failure He is euvolemic on my exam. The chest x-ray does not reveal any evidence of vascular congestion.  - Avoid large volume shifts. - Institute strict I's and O's and obtain daily weights. - Serum creatinine is 1.81 with a GFR of 31. Use furosemide only as needed to maintain his fluid status net even.   Meade Maw, MD  01/15/2018, 11:03 PM  Cardiology Overnight Team Please contact [facility] for night-coverage after hours (4p -7a ) and weekends on [email or web address]

## 2018-01-16 ENCOUNTER — Encounter (HOSPITAL_COMMUNITY): Payer: Self-pay | Admitting: Radiology

## 2018-01-16 ENCOUNTER — Observation Stay (HOSPITAL_COMMUNITY): Payer: Medicare Other

## 2018-01-16 ENCOUNTER — Ambulatory Visit (HOSPITAL_COMMUNITY): Payer: Medicare Other

## 2018-01-16 ENCOUNTER — Other Ambulatory Visit: Payer: Self-pay

## 2018-01-16 ENCOUNTER — Telehealth: Payer: Self-pay

## 2018-01-16 DIAGNOSIS — N183 Chronic kidney disease, stage 3 (moderate): Secondary | ICD-10-CM

## 2018-01-16 DIAGNOSIS — I5021 Acute systolic (congestive) heart failure: Secondary | ICD-10-CM

## 2018-01-16 DIAGNOSIS — F0390 Unspecified dementia without behavioral disturbance: Secondary | ICD-10-CM

## 2018-01-16 DIAGNOSIS — J9811 Atelectasis: Secondary | ICD-10-CM | POA: Diagnosis not present

## 2018-01-16 DIAGNOSIS — I480 Paroxysmal atrial fibrillation: Secondary | ICD-10-CM | POA: Diagnosis not present

## 2018-01-16 DIAGNOSIS — J90 Pleural effusion, not elsewhere classified: Secondary | ICD-10-CM | POA: Diagnosis not present

## 2018-01-16 DIAGNOSIS — R079 Chest pain, unspecified: Secondary | ICD-10-CM | POA: Diagnosis not present

## 2018-01-16 HISTORY — PX: IR THORACENTESIS ASP PLEURAL SPACE W/IMG GUIDE: IMG5380

## 2018-01-16 LAB — TROPONIN I
Troponin I: 0.08 ng/mL (ref <0.03)
Troponin I: 0.08 ng/mL
Troponin I: 0.08 ng/mL

## 2018-01-16 LAB — BODY FLUID CELL COUNT WITH DIFFERENTIAL
EOS FL: 3 %
LYMPHS FL: 54 %
Monocyte-Macrophage-Serous Fluid: 19 % — ABNORMAL LOW (ref 50–90)
NEUTROPHIL FLUID: 24 % (ref 0–25)
WBC FLUID: 710 uL (ref 0–1000)

## 2018-01-16 LAB — CBC
HCT: 30.9 % — ABNORMAL LOW (ref 39.0–52.0)
Hemoglobin: 10.2 g/dL — ABNORMAL LOW (ref 13.0–17.0)
MCH: 33.7 pg (ref 26.0–34.0)
MCHC: 33 g/dL (ref 30.0–36.0)
MCV: 102 fL — ABNORMAL HIGH (ref 78.0–100.0)
Platelets: 151 K/uL (ref 150–400)
RBC: 3.03 MIL/uL — ABNORMAL LOW (ref 4.22–5.81)
RDW: 14.3 % (ref 11.5–15.5)
WBC: 6.6 K/uL (ref 4.0–10.5)

## 2018-01-16 LAB — COMPREHENSIVE METABOLIC PANEL WITH GFR
ALT: 21 U/L (ref 0–44)
AST: 21 U/L (ref 15–41)
Albumin: 2.4 g/dL — ABNORMAL LOW (ref 3.5–5.0)
Alkaline Phosphatase: 123 U/L (ref 38–126)
Anion gap: 11 (ref 5–15)
BUN: 34 mg/dL — ABNORMAL HIGH (ref 8–23)
CO2: 26 mmol/L (ref 22–32)
Calcium: 8 mg/dL — ABNORMAL LOW (ref 8.9–10.3)
Chloride: 101 mmol/L (ref 98–111)
Creatinine, Ser: 1.74 mg/dL — ABNORMAL HIGH (ref 0.61–1.24)
GFR calc Af Amer: 38 mL/min — ABNORMAL LOW
GFR calc non Af Amer: 33 mL/min — ABNORMAL LOW
Glucose, Bld: 92 mg/dL (ref 70–99)
Potassium: 3.8 mmol/L (ref 3.5–5.1)
Sodium: 138 mmol/L (ref 135–145)
Total Bilirubin: 0.6 mg/dL (ref 0.3–1.2)
Total Protein: 5.5 g/dL — ABNORMAL LOW (ref 6.5–8.1)

## 2018-01-16 LAB — GRAM STAIN

## 2018-01-16 LAB — LACTATE DEHYDROGENASE, PLEURAL OR PERITONEAL FLUID: LD, Fluid: 134 U/L — ABNORMAL HIGH (ref 3–23)

## 2018-01-16 LAB — LACTATE DEHYDROGENASE: LDH: 187 U/L (ref 98–192)

## 2018-01-16 LAB — PROTEIN, PLEURAL OR PERITONEAL FLUID: Total protein, fluid: <3 g/dL

## 2018-01-16 MED ORDER — LIDOCAINE HCL (PF) 2 % IJ SOLN
INTRAMUSCULAR | Status: AC
  Filled 2018-01-16: qty 20

## 2018-01-16 MED ORDER — LIDOCAINE HCL (PF) 2 % IJ SOLN
INTRAMUSCULAR | Status: DC | PRN
  Administered 2018-01-16: 10 mL

## 2018-01-16 NOTE — Progress Notes (Addendum)
Progress Note    Thomas Hollingshead Jr.  YIF:[id number] DOB: 12-22-1924  DOA: 01/15/2018 PCP: Townsend Roger, MD    Brief Narrative:     Medical records reviewed and are as summarized below:  Thomas Scollard [name] is an 84 y.o. male w dementia, tia, ckd stage 3, Pafib, apparently c/o chest pain and sob while at [facility].  Pt has dementia so its difficult to tell if chest pain truly present or not.  Pt at current time denies chest pain.   Assessment/Plan:   Principal Problem:   Chest pain Active Problems:   Paroxysmal atrial fibrillation (HCC)   CKD (chronic kidney disease) stage 3, GFR 30-59 ml/min (HCC)   Dementia   Anemia  Pleural effusion right (prod due to HF-- gained 10 lbs from 7/21-7/24) -s/p thoracentesis- 1.1L removed -await labs -lasix is PRN due to CKD  Chest pain -recent echo in 7/19 -? If related to pleural effusion -troponin flat -cardiology consult appreciated  Pafib Cont metoprolol Cont Aspirin  Hypertension Cont Amlodipine 42m po qday  Hyperlipidemia Cont Lipitor 133mpo qhs  Gout Cont allopurinol 10012mo qday  Hypothyroidism Cont levothyroxine  Anxiety Cont Zoloft 48m78m qday  Anemia Check cbc in am  CKD stage 3, -monitor  Chronic back pain Cont gabapentin  Deep tissues injury on heel  Dementia -appears to be at baseline -went to SNF at last d/c and appears to have not made much progress -PT eval  Overall poor prognosis, family met with palliative care last admission-- do not think another consult would be helpful-- palliative care was reccommended to follow at SNF unless situation changes    Family Communication/Anticipated D/C date and plan/Code Status   DVT prophylaxis: Lovenox ordered. Code Status: Full Code.  Family Communication: at bedside Disposition Plan: SNF?   Medical Consultants:    Cards  IR    Subjective:   On bed pan No current complaints  Objective:    Vitals:   01/15/18 2329  01/16/18 0429 01/16/18 0816 01/16/18 1254  BP: 131/69 (!) 144/71 (!) 135/97 114/72  Pulse: 68 77 91 68  Resp: 16 18 18 20   Temp: (!) 97 F (36.1 C) (!) 97.4 F (36.3 C) 98.3 F (36.8 C) 98.6 F (37 C)  TempSrc:  Oral    SpO2: 98% 97% 99% 100%  Weight: 70.5 kg 70.4 kg    Height: 5' 10"  (1.778 m)       Intake/Output Summary (Last 24 hours) at 01/16/2018 1401 Last data filed at 01/16/2018 1358 Gross per 24 hour  Intake 240 ml  Output 1100 ml  Net -860 ml   Filed Weights   01/15/18 2019 01/15/18 2329 01/16/18 0429  Weight: 71.1 kg 70.5 kg 70.4 kg    Exam: Frail appearing Diminished breath sounds Upper extremities swollen No LE edema +BS, soft  Data Reviewed:   I have personally reviewed following labs and imaging studies:  Labs: Labs show the following:   Basic Metabolic Panel: Recent Labs  Lab 01/15/18 2030 01/16/18 0551  NA 136 138  K 3.9 3.8  CL 100 101  CO2 23 26  GLUCOSE 132* 92  BUN 37* 34*  CREATININE 1.81* 1.74*  CALCIUM 8.1* 8.0*   GFR Estimated Creatinine Clearance: 27.5 mL/min (A) (by C-G formula based on SCr of 1.74 mg/dL (H)). Liver Function Tests: Recent Labs  Lab 01/16/18 0551  AST 21  ALT 21  ALKPHOS 123  BILITOT 0.6  PROT 5.5*  ALBUMIN 2.4*  No results for input(s): LIPASE, AMYLASE in the last 168 hours. No results for input(s): AMMONIA in the last 168 hours. Coagulation profile No results for input(s): INR, PROTIME in the last 168 hours.  CBC: Recent Labs  Lab 01/15/18 2030 01/16/18 0551  WBC 8.2 6.6  HGB 10.9* 10.2*  HCT 34.7* 30.9*  MCV 105.8* 102.0*  PLT 167 151   Cardiac Enzymes: Recent Labs  Lab 01/15/18 2355 01/16/18 0551 01/16/18 1223  TROPONINI 0.08* 0.08* 0.08*   BNP (last 3 results) No results for input(s): PROBNP in the last 8760 hours. CBG: No results for input(s): GLUCAP in the last 168 hours. D-Dimer: No results for input(s): DDIMER in the last 72 hours. Hgb A1c: No results for input(s):  HGBA1C in the last 72 hours. Lipid Profile: No results for input(s): CHOL, HDL, LDLCALC, TRIG, CHOLHDL, LDLDIRECT in the last 72 hours. Thyroid function studies: No results for input(s): TSH, T4TOTAL, T3FREE, THYROIDAB in the last 72 hours.  Invalid input(s): FREET3 Anemia work up: No results for input(s): VITAMINB12, FOLATE, FERRITIN, TIBC, IRON, RETICCTPCT in the last 72 hours. Sepsis Labs: Recent Labs  Lab 01/15/18 2030 01/16/18 0551  WBC 8.2 6.6    Microbiology No results found for this or any previous visit (from the past 240 hour(s)).  Procedures and diagnostic studies:  Dg Chest 2 View  Result Date: 01/15/2018 CLINICAL DATA:  Central chest pain EXAM: CHEST - 2 VIEW COMPARISON:  12/25/2017 FINDINGS: Cardiac shadow is enlarged in size and stable. Loop recorder is again noted. Right-sided pleural effusion is seen with likely underlying atelectatic changes. No other focal infiltrate is seen. IMPRESSION: Right-sided effusion with underlying atelectatic changes. Electronically Signed   By: Inez Catalina M.D.   On: 01/15/2018 21:24   Ct Chest Wo Contrast  Result Date: 01/15/2018 CLINICAL DATA:  Chest pain and shortness of breath. EXAM: CT CHEST WITHOUT CONTRAST TECHNIQUE: Multidetector CT imaging of the chest was performed following the standard protocol without IV contrast. COMPARISON:  None. FINDINGS: Cardiovascular: Moderate cardiac enlargement. There is aortic atherosclerosis. No pericardial effusion. Calcifications within the LAD, left circumflex and RCA coronary arteries noted. Mediastinum/Nodes: The trachea appears patent and is midline. Moderate to large hiatal hernia noted. Normal appearance of the thyroid gland. No enlarged mediastinal or axillary adenopathy. Lungs/Pleura: There is a moderate to large right pleural effusion. Overlying passive atelectasis within the posterior right lower lobe identified. Small left pleural effusion is also identified. Passive atelectasis overlying  the hiatal hernia noted in the left lower lobe. Patchy area of ground-glass attenuation within the lingula is identified compatible with nonspecific pneumonitis. Upper Abdomen: Calcified granulomas identified within the spleen. Previous cholecystectomy. Extensive atherosclerotic disease involves the abdominal aorta and branch vessels. Musculoskeletal: Spondylosis noted within the thoracic spine. Previous right shoulder arthroplasty. Chronic appearing lateral right third rib fracture deformity noted. Age indeterminate fracture deformity involving the proximal body of sternum is identified, image 61/7. This may be related to recent CPR. Clinical correlation advised. Chronic L1 superior endplate compression fracture. IMPRESSION: 1. Moderate to large right pleural effusion with overlying passive atelectasis. 2. Small left pleural effusion. 3. Age indeterminate fracture deformity involving the proximal body of sternum. This may be related to recent CPR. Clinical correlation advised. 4. Chronic right third rib fracture and L1 superior endplate compression fracture. 5. Multi vessel coronary artery atherosclerotic calcifications. 6. Hiatal hernia. Aortic Atherosclerosis (ICD10-I70.0). Electronically Signed   By: Kerby Moors M.D.   On: 01/15/2018 23:39    Medications:   .  enoxaparin (LOVENOX) injection  30 mg Subcutaneous Q24H  . lidocaine      . sodium chloride flush  3 mL Intravenous Q12H   Continuous Infusions: . sodium chloride       LOS: 0 days   Geradine Girt  [facility]   *Please refer to [email or web address], password [email or web address] to get updated schedule on who will round on this patient, as hospitalists switch teams weekly. If 7PM-7AM, please contact night-coverage at [email or web address], password [email or web address] for any overnight needs.  01/16/2018, 2:01 PM

## 2018-01-16 NOTE — Procedures (Signed)
PROCEDURE SUMMARY:  Successful US guided right thoracentesis. Yielded 1.1 L of amber colored fluid. Pt tolerated procedure well. No immediate complications.  Specimen was sent for labs. CXR ordered.  Ascencion Dike PA-C 01/16/2018 1:54 PM

## 2018-01-16 NOTE — Telephone Encounter (Signed)
Notes on file.

## 2018-01-16 NOTE — Progress Notes (Signed)
DAILY PROGRESS NOTE   Patient Name: Thomas Hooper. Date of Encounter: 01/16/2018  Chief Complaint   Shortness of breath  Patient Profile   84 yo male with history of multiple falls, dementia, chronic systolic heart failure with LVEF 40 to 45% as of 12/26/2017, paroxysmal atrial fibrillation, chronic kidney disease, history of seizures and status post implanted loop recorder which documented burden of atrial fibrillation.  Presents with chest pain and elevated troponin.  Subjective   Thomas Hooper reports his chest pain has improved overnight.  Troponin has been flat elevated, more consistent with heart failure.  He remains in A. fib which is rate controlled.  CT shows moderate to large pleural effusion on the right. Suspect this is related to CHF.  Objective   Vitals:   01/15/18 2253 01/15/18 2329 01/16/18 0429 01/16/18 0816  BP:  131/69 (!) 144/71 (!) 135/97  Pulse:  68 77 91  Resp:  16 18 18   Temp: 98.7 F (37.1 C) (!) 97 F (36.1 C) (!) 97.4 F (36.3 C) 98.3 F (36.8 C)  TempSrc: Oral  Oral   SpO2:  98% 97% 99%  Weight:  70.5 kg 70.4 kg   Height:  5' 10"  (1.778 m)      Intake/Output Summary (Last 24 hours) at 01/16/2018 1228 Last data filed at 01/16/2018 1048 Gross per 24 hour  Intake 240 ml  Output -  Net 240 ml   Filed Weights   01/15/18 2019 01/15/18 2329 01/16/18 0429  Weight: 71.1 kg 70.5 kg 70.4 kg    Physical Exam   General appearance: appears older than stated age, cachectic and confuseed Neck: JVD - a few cm above sternal notch, no carotid bruit and thyroid not enlarged, symmetric, no tenderness/mass/nodules Lungs: diminished breath sounds bibasilar, RLL and RML and dullness to percussion RLL and RML Heart: irregularly irregular rhythm Abdomen: soft, non-tender; bowel sounds normal; no masses,  no organomegaly Extremities: extremities normal, atraumatic, no cyanosis or edema Pulses: 2+ and symmetric Skin: pale, warm, dry Neurologic: Mental status:  confused Psych: Pleasant  Inpatient Medications    Scheduled Meds: . enoxaparin (LOVENOX) injection  30 mg Subcutaneous Q24H  . sodium chloride flush  3 mL Intravenous Q12H    Continuous Infusions: . sodium chloride      PRN Meds: sodium chloride, acetaminophen **OR** acetaminophen, sodium chloride flush   Labs   Results for orders placed or performed during the hospital encounter of 01/15/18 (from the past 48 hour(s))  Basic metabolic panel     Status: Abnormal   Collection Time: 01/15/18  8:30 PM  Result Value Ref Range   Sodium 136 135 - 145 mmol/L   Potassium 3.9 3.5 - 5.1 mmol/L   Chloride 100 98 - 111 mmol/L   CO2 23 22 - 32 mmol/L   Glucose, Bld 132 (H) 70 - 99 mg/dL   BUN 37 (H) 8 - 23 mg/dL   Creatinine, Ser 1.81 (H) 0.61 - 1.24 mg/dL   Calcium 8.1 (L) 8.9 - 10.3 mg/dL   GFR calc non Af Amer 31 (L) >60 mL/min   GFR calc Af Amer 36 (L) >60 mL/min    Comment: (NOTE) The eGFR has been calculated using the CKD EPI equation. This calculation has not been validated in all clinical situations. eGFR's persistently <60 mL/min signify possible Chronic Kidney Disease.    Anion gap 13 5 - 15    Comment: Performed at [facility] [address], [place], [place] [address]  CBC     Status: Abnormal   Collection Time: 01/15/18  8:30 PM  Result Value Ref Range   WBC 8.2 4.0 - 10.5 K/uL   RBC 3.28 (L) 4.22 - 5.81 MIL/uL   Hemoglobin 10.9 (L) 13.0 - 17.0 g/dL   HCT 34.7 (L) 39.0 - 52.0 %   MCV 105.8 (H) 78.0 - 100.0 fL   MCH 33.2 26.0 - 34.0 pg   MCHC 31.4 30.0 - 36.0 g/dL   RDW 14.5 11.5 - 15.5 %   Platelets 167 150 - 400 K/uL    Comment: Performed at [facility] [address] [place], [place] [address]  I-stat troponin, ED     Status: Abnormal   Collection Time: 01/15/18  8:37 PM  Result Value Ref Range   Troponin i, poc 0.12 (HH) 0.00 - 0.08 ng/mL   Comment NOTIFIED PHYSICIAN    Comment 3            Comment: Due to the release kinetics of  cTnI, a negative result within the first hours of the onset of symptoms does not rule out myocardial infarction with certainty. If myocardial infarction is still suspected, repeat the test at appropriate intervals.   Troponin I     Status: Abnormal   Collection Time: 01/15/18 11:55 PM  Result Value Ref Range   Troponin I 0.08 (HH) <0.03 ng/mL    Comment: CRITICAL RESULT CALLED TO, READ BACK BY AND VERIFIED WITH: WORTH,S RN 01/16/2018 0229 JORDANS Performed at [facility], [place] [address] [place], [place] [address]   Comprehensive metabolic panel     Status: Abnormal   Collection Time: 01/16/18  5:51 AM  Result Value Ref Range   Sodium 138 135 - 145 mmol/L   Potassium 3.8 3.5 - 5.1 mmol/L   Chloride 101 98 - 111 mmol/L   CO2 26 22 - 32 mmol/L   Glucose, Bld 92 70 - 99 mg/dL   BUN 34 (H) 8 - 23 mg/dL   Creatinine, Ser 1.74 (H) 0.61 - 1.24 mg/dL   Calcium 8.0 (L) 8.9 - 10.3 mg/dL   Total Protein 5.5 (L) 6.5 - 8.1 g/dL   Albumin 2.4 (L) 3.5 - 5.0 g/dL   AST 21 15 - 41 U/L   ALT 21 0 - 44 U/L   Alkaline Phosphatase 123 38 - 126 U/L   Total Bilirubin 0.6 0.3 - 1.2 mg/dL   GFR calc non Af Amer 33 (L) >60 mL/min   GFR calc Af Amer 38 (L) >60 mL/min    Comment: (NOTE) The eGFR has been calculated using the CKD EPI equation. This calculation has not been validated in all clinical situations. eGFR's persistently <60 mL/min signify possible Chronic Kidney Disease.    Anion gap 11 5 - 15    Comment: Performed at [facility] [address] [place], [place] [address]  CBC     Status: Abnormal   Collection Time: 01/16/18  5:51 AM  Result Value Ref Range   WBC 6.6 4.0 - 10.5 K/uL   RBC 3.03 (L) 4.22 - 5.81 MIL/uL   Hemoglobin 10.2 (L) 13.0 - 17.0 g/dL   HCT 30.9 (L) 39.0 - 52.0 %   MCV 102.0 (H) 78.0 - 100.0 fL   MCH 33.7 26.0 - 34.0 pg   MCHC 33.0 30.0 - 36.0 g/dL   RDW 14.3 11.5 - 15.5 %   Platelets 151 150 - 400 K/uL    Comment: Performed  at [facility], [place] [address], [place], [place] [address]  Troponin I     Status: Abnormal   Collection Time: 01/16/18  5:51 AM  Result Value Ref Range   Troponin I 0.08 (HH) <0.03 ng/mL    Comment: CRITICAL VALUE NOTED.  VALUE IS CONSISTENT WITH PREVIOUSLY REPORTED AND CALLED VALUE. Performed at [facility], [place] [address], [place],  [address]     ECG   Afib with CVR - Personally Reviewed  Telemetry   Afib - Personally Reviewed  Radiology    Dg Chest 2 View  Result Date: 01/15/2018 CLINICAL DATA:  Central chest pain EXAM: CHEST - 2 VIEW COMPARISON:  12/25/2017 FINDINGS: Cardiac shadow is enlarged in size and stable. Loop recorder is again noted. Right-sided pleural effusion is seen with likely underlying atelectatic changes. No other focal infiltrate is seen. IMPRESSION: Right-sided effusion with underlying atelectatic changes. Electronically Signed   By: Inez Catalina M.D.   On: 01/15/2018 21:24   Ct Chest Wo Contrast  Result Date: 01/15/2018 CLINICAL DATA:  Chest pain and shortness of breath. EXAM: CT CHEST WITHOUT CONTRAST TECHNIQUE: Multidetector CT imaging of the chest was performed following the standard protocol without IV contrast. COMPARISON:  None. FINDINGS: Cardiovascular: Moderate cardiac enlargement. There is aortic atherosclerosis. No pericardial effusion. Calcifications within the LAD, left circumflex and RCA coronary arteries noted. Mediastinum/Nodes: The trachea appears patent and is midline. Moderate to large hiatal hernia noted. Normal appearance of the thyroid gland. No enlarged mediastinal or axillary adenopathy. Lungs/Pleura: There is a moderate to large right pleural effusion. Overlying passive atelectasis within the posterior right lower lobe identified. Small left pleural effusion is also identified. Passive atelectasis overlying the hiatal hernia noted in the left lower lobe. Patchy area of ground-glass attenuation within the lingula is identified compatible with  nonspecific pneumonitis. Upper Abdomen: Calcified granulomas identified within the spleen. Previous cholecystectomy. Extensive atherosclerotic disease involves the abdominal aorta and branch vessels. Musculoskeletal: Spondylosis noted within the thoracic spine. Previous right shoulder arthroplasty. Chronic appearing lateral right third rib fracture deformity noted. Age indeterminate fracture deformity involving the proximal body of sternum is identified, image 61/7. This may be related to recent CPR. Clinical correlation advised. Chronic L1 superior endplate compression fracture. IMPRESSION: 1. Moderate to large right pleural effusion with overlying passive atelectasis. 2. Small left pleural effusion. 3. Age indeterminate fracture deformity involving the proximal body of sternum. This may be related to recent CPR. Clinical correlation advised. 4. Chronic right third rib fracture and L1 superior endplate compression fracture. 5. Multi vessel coronary artery atherosclerotic calcifications. 6. Hiatal hernia. Aortic Atherosclerosis (ICD10-I70.0). Electronically Signed   By: Kerby Moors M.D.   On: 01/15/2018 23:39    Cardiac Studies   N/A  Assessment   1. Principal Problem: 2.   Chest pain 3. Active Problems: 4.   Paroxysmal atrial fibrillation (HCC) 5.   CKD (chronic kidney disease) stage 3, GFR 30-59 ml/min (HCC) 6.   Dementia 7.   Anemia 8.   Plan   1. Troponins not consistent with ACS. Symptoms more likely related to acute systolic heart failure with moderate to large right-sided pericardial effusion. Creatinine trending up with diuresis. D/w Dr. Eliseo Squires, agree he may benefit from diagnostic/therapeutic thoracentesis. Palliative care evaluation is reasonable - he is not a candidate for cardiac interventions as described. Afib is rate-controlled.  [facility] will sign off.   Medication Recommendations:  Lasix as tolerated, aspirin 81 mg daily, resume atorvastatin and metoprolol  Other  recommendations (labs, testing, etc):  Palliative care evaluation Follow up as an outpatient:  Dr. Agustin [facility] cardiology [place])   Time Spent Directly with Patient:  I have spent a total of 15 minutes with the patient reviewing hospital notes, telemetry, EKGs, labs and examining the patient as well as establishing an assessment and plan that was discussed personally with the patient.  > 50% of time was spent in direct patient care.  Length of Stay:  LOS: 0 days   Pixie Casino, MD, [facility], [place] Director of the [facility] Lipid [facility] &  Cardiovascular Risk [facility] Diplomate of the American Board of Clinical Lipidology Attending Cardiologist  Direct Dial: [phone]  Fax: [phone]  Website:  [email or web address] 01/16/2018, 12:28 PM

## 2018-01-17 ENCOUNTER — Observation Stay (HOSPITAL_COMMUNITY): Payer: Medicare Other

## 2018-01-17 ENCOUNTER — Telehealth: Payer: Self-pay | Admitting: Cardiology

## 2018-01-17 ENCOUNTER — Ambulatory Visit: Payer: Medicare Other | Admitting: Cardiology

## 2018-01-17 ENCOUNTER — Ambulatory Visit: Payer: Self-pay | Admitting: *Deleted

## 2018-01-17 DIAGNOSIS — G92 Toxic encephalopathy: Secondary | ICD-10-CM

## 2018-01-17 DIAGNOSIS — N183 Chronic kidney disease, stage 3 (moderate): Secondary | ICD-10-CM | POA: Diagnosis not present

## 2018-01-17 DIAGNOSIS — E785 Hyperlipidemia, unspecified: Secondary | ICD-10-CM | POA: Diagnosis not present

## 2018-01-17 DIAGNOSIS — F0390 Unspecified dementia without behavioral disturbance: Secondary | ICD-10-CM | POA: Diagnosis not present

## 2018-01-17 DIAGNOSIS — R0789 Other chest pain: Secondary | ICD-10-CM | POA: Diagnosis not present

## 2018-01-17 DIAGNOSIS — R079 Chest pain, unspecified: Secondary | ICD-10-CM | POA: Diagnosis not present

## 2018-01-17 DIAGNOSIS — L8990 Pressure ulcer of unspecified site, unspecified stage: Secondary | ICD-10-CM

## 2018-01-17 DIAGNOSIS — E039 Hypothyroidism, unspecified: Secondary | ICD-10-CM | POA: Diagnosis not present

## 2018-01-17 DIAGNOSIS — Z7189 Other specified counseling: Secondary | ICD-10-CM

## 2018-01-17 DIAGNOSIS — F0280 Dementia in other diseases classified elsewhere without behavioral disturbance: Secondary | ICD-10-CM | POA: Diagnosis not present

## 2018-01-17 DIAGNOSIS — D649 Anemia, unspecified: Secondary | ICD-10-CM | POA: Diagnosis not present

## 2018-01-17 DIAGNOSIS — I480 Paroxysmal atrial fibrillation: Secondary | ICD-10-CM | POA: Diagnosis not present

## 2018-01-17 DIAGNOSIS — R4189 Other symptoms and signs involving cognitive functions and awareness: Secondary | ICD-10-CM

## 2018-01-17 DIAGNOSIS — Z515 Encounter for palliative care: Secondary | ICD-10-CM | POA: Diagnosis not present

## 2018-01-17 DIAGNOSIS — I5022 Chronic systolic (congestive) heart failure: Secondary | ICD-10-CM | POA: Diagnosis not present

## 2018-01-17 DIAGNOSIS — G629 Polyneuropathy, unspecified: Secondary | ICD-10-CM | POA: Diagnosis not present

## 2018-01-17 DIAGNOSIS — J90 Pleural effusion, not elsewhere classified: Secondary | ICD-10-CM

## 2018-01-17 DIAGNOSIS — I639 Cerebral infarction, unspecified: Secondary | ICD-10-CM | POA: Diagnosis not present

## 2018-01-17 DIAGNOSIS — I130 Hypertensive heart and chronic kidney disease with heart failure and stage 1 through stage 4 chronic kidney disease, or unspecified chronic kidney disease: Secondary | ICD-10-CM | POA: Diagnosis not present

## 2018-01-17 DIAGNOSIS — R4182 Altered mental status, unspecified: Secondary | ICD-10-CM | POA: Diagnosis not present

## 2018-01-17 LAB — CBC
HCT: 31.4 % — ABNORMAL LOW (ref 39.0–52.0)
HEMOGLOBIN: 9.9 g/dL — AB (ref 13.0–17.0)
MCH: 33.1 pg (ref 26.0–34.0)
MCHC: 31.5 g/dL (ref 30.0–36.0)
MCV: 105 fL — ABNORMAL HIGH (ref 78.0–100.0)
Platelets: 163 10*3/uL (ref 150–400)
RBC: 2.99 MIL/uL — ABNORMAL LOW (ref 4.22–5.81)
RDW: 14.8 % (ref 11.5–15.5)
WBC: 5.6 10*3/uL (ref 4.0–10.5)

## 2018-01-17 LAB — BASIC METABOLIC PANEL
Anion gap: 9 (ref 5–15)
BUN: 27 mg/dL — ABNORMAL HIGH (ref 8–23)
CHLORIDE: 102 mmol/L (ref 98–111)
CO2: 26 mmol/L (ref 22–32)
CREATININE: 1.61 mg/dL — AB (ref 0.61–1.24)
Calcium: 7.9 mg/dL — ABNORMAL LOW (ref 8.9–10.3)
GFR calc Af Amer: 41 mL/min — ABNORMAL LOW (ref >60)
GFR calc non Af Amer: 36 mL/min — ABNORMAL LOW (ref >60)
Glucose, Bld: 91 mg/dL (ref 70–99)
POTASSIUM: 3.8 mmol/L (ref 3.5–5.1)
SODIUM: 137 mmol/L (ref 135–145)

## 2018-01-17 LAB — GLUCOSE, CAPILLARY: GLUCOSE-CAPILLARY: 101 mg/dL — AB (ref 70–99)

## 2018-01-17 MED ORDER — BISACODYL 10 MG RE SUPP: 10.0000 mg | Freq: Every day | RECTAL | Status: DC | PRN

## 2018-01-17 MED ORDER — METOPROLOL TARTRATE 25 MG PO TABS
25.0000 mg | ORAL_TABLET | Freq: Two times a day (BID) | ORAL | Status: DC
  Administered 2018-01-17 – 2018-01-18 (×2): 25 mg via ORAL
  Filled 2018-01-17 (×2): qty 1

## 2018-01-17 MED ORDER — DOCUSATE SODIUM 50 MG/5ML PO LIQD
100.0000 mg | Freq: Two times a day (BID) | ORAL | Status: DC
  Administered 2018-01-17: 100 mg via ORAL
  Filled 2018-01-17 (×3): qty 10

## 2018-01-17 MED ORDER — METOPROLOL TARTRATE 25 MG PO TABS: 25.0000 mg | ORAL_TABLET | Freq: Two times a day (BID) | ORAL | Status: DC

## 2018-01-17 MED ORDER — SERTRALINE HCL 50 MG PO TABS
25.0000 mg | ORAL_TABLET | Freq: Every day | ORAL | Status: DC
  Administered 2018-01-17: 25 mg via ORAL
  Filled 2018-01-17: qty 1

## 2018-01-17 MED ORDER — ALLOPURINOL 100 MG PO TABS
100.0000 mg | ORAL_TABLET | Freq: Every day | ORAL | Status: DC
  Administered 2018-01-17 – 2018-01-18 (×2): 100 mg via ORAL
  Filled 2018-01-17 (×2): qty 1

## 2018-01-17 MED ORDER — ATORVASTATIN CALCIUM 10 MG PO TABS
10.0000 mg | ORAL_TABLET | Freq: Every evening | ORAL | Status: DC
  Administered 2018-01-17 – 2018-01-18 (×2): 10 mg via ORAL
  Filled 2018-01-17 (×2): qty 1

## 2018-01-17 MED ORDER — LEVOTHYROXINE SODIUM 50 MCG PO TABS
50.0000 ug | ORAL_TABLET | Freq: Every day | ORAL | Status: DC
  Administered 2018-01-18: 50 ug via ORAL
  Filled 2018-01-17: qty 1

## 2018-01-17 MED ORDER — VITAMIN B-12 1000 MCG PO TABS
1000.0000 ug | ORAL_TABLET | Freq: Every day | ORAL | Status: DC
Start: 2018-01-17 — End: 2018-01-18
  Administered 2018-01-17 – 2018-01-18 (×2): 1000 ug via ORAL
  Filled 2018-01-17 (×2): qty 1

## 2018-01-17 MED ORDER — METOPROLOL TARTRATE 12.5 MG HALF TABLET
12.5000 mg | ORAL_TABLET | Freq: Two times a day (BID) | ORAL | Status: DC
  Administered 2018-01-17: 12.5 mg via ORAL
  Filled 2018-01-17: qty 1

## 2018-01-17 MED ORDER — CALCIUM-VITAMIN D 500-200 MG-UNIT PO TABS
1.0000 | ORAL_TABLET | Freq: Every evening | ORAL | Status: DC
  Filled 2018-01-17 (×2): qty 1

## 2018-01-17 MED ORDER — CALCIUM CARBONATE-VITAMIN D 500-200 MG-UNIT PO TABS
1.0000 | ORAL_TABLET | Freq: Every evening | ORAL | Status: DC
  Administered 2018-01-17 – 2018-01-18 (×2): 1 via ORAL
  Filled 2018-01-17 (×2): qty 1

## 2018-01-17 MED ORDER — POLYETHYLENE GLYCOL 3350 17 G PO PACK: 17.0000 g | PACK | Freq: Every day | ORAL | Status: DC

## 2018-01-17 MED ORDER — GABAPENTIN 600 MG PO TABS: 300.0000 mg | ORAL_TABLET | Freq: Every day | ORAL | Status: DC

## 2018-01-17 MED ORDER — FERROUS SULFATE 325 (65 FE) MG PO TABS
325.0000 mg | ORAL_TABLET | Freq: Every day | ORAL | Status: DC
  Administered 2018-01-17 – 2018-01-18 (×2): 325 mg via ORAL
  Filled 2018-01-17 (×2): qty 1

## 2018-01-17 MED ORDER — FUROSEMIDE 20 MG PO TABS
20.0000 mg | ORAL_TABLET | Freq: Every day | ORAL | Status: DC
  Administered 2018-01-17 – 2018-01-18 (×2): 20 mg via ORAL
  Filled 2018-01-17 (×2): qty 1

## 2018-01-17 MED ORDER — GABAPENTIN 600 MG PO TABS
300.0000 mg | ORAL_TABLET | Freq: Three times a day (TID) | ORAL | Status: DC
  Administered 2018-01-17 – 2018-01-18 (×4): 300 mg via ORAL
  Filled 2018-01-17 (×4): qty 1

## 2018-01-17 MED ORDER — FUROSEMIDE 20 MG PO TABS: ORAL_TABLET | ORAL | Status: AC

## 2018-01-17 NOTE — Progress Notes (Signed)
Patient came back from head CT, seen by Dr. Eliseo Squires and Neurology.Family was updated with patient status by MD's. With order to transfer to Neuro for close monitoring.

## 2018-01-17 NOTE — Progress Notes (Addendum)
Progress Note    Thomas Cybulski Jr.  KDT:[id number] DOB: 1924/07/24  DOA: 01/15/2018 PCP: Townsend Roger, MD    Brief Narrative:     Medical records reviewed and are as summarized below:  Thomas Montenegro [name] is an 84 y.o. male w dementia, tia, ckd stage 3, Pafib, apparently c/o chest pain and sob while at [facility].  Pt has dementia so its difficult to tell if chest pain truly present or not.  Pt at current time denies chest pain.  On 8/13 AM, patient was doing well up in chair, eating breakfast.  Was on the bedpan trying to have BM when his HR dropped and he became unresponsive.  CT scan done.  Patient slowly coming back to baseline.  Assessment/Plan:   Principal Problem:   Chest pain Active Problems:   Paroxysmal atrial fibrillation (HCC)   CKD (chronic kidney disease) stage 3, GFR 30-59 ml/min (HCC)   Dementia   Anemia   Pressure injury of skin  Unresponsive episode -EEG pending -MRI/MRA pending (will remove hip brace and replace after) -EEG pending -neuro consult appreciated -? Seizure-- change Neurontin to 300 TID  Pleural effusion right (? Exudative so may be trauma related to CPR vs due to HF-- gained 10 lbs from 7/21-7/24) -s/p thoracentesis- 1.1L removed -lasix PO -? Traumatic pleural effusion  Chest pain -recent echo in 7/19 -? If related to pleural effusion -troponin flat -cardiology consult appreciated  Pafib Cont metoprolol- increase dose Cont Aspirin- not a NOAC candidate  Hypertension Cont Amlodipine 5mg  po qday  Hyperlipidemia Cont Lipitor 10mg  po qhs  Gout Cont allopurinol 100mg  po qday  Hypothyroidism Cont levothyroxine  Anxiety Cont Zoloft 25mg  po qday  CKD stage 3 -monitor  Deep tissues injury on heel  Dementia -appears to be at baseline -went to SNF at last d/c and appears to have not made much progress -PT eval  Overall poor prognosis- will have palliative care come see family in light of the episode on  8/13   Family Communication/Anticipated D/C date and plan/Code Status   DVT prophylaxis: Lovenox ordered. Code Status: Full Code.  Family Communication: at bedside Disposition Plan: SNF?   Medical Consultants:    Cards  IR  Palliative care  neurology    Subjective:   This AM-- in chair- no complaints, breathing better -see above for PM episode  Objective:    Vitals:   01/17/18 1206 01/17/18 1206 01/17/18 1216 01/17/18 1303  BP: (!) 158/94 (!) 158/94 (!) 150/86 (!) 154/96  Pulse: (!) 119 (!) 119 97 (!) 114  Resp:      Temp:      TempSrc:      SpO2:      Weight:      Height:        Intake/Output Summary (Last 24 hours) at 01/17/2018 1612 Last data filed at 01/17/2018 0919 Gross per 24 hour  Intake 660 ml  Output 775 ml  Net -115 ml   Filed Weights   01/15/18 2329 01/16/18 0429 01/17/18 0449  Weight: 70.5 kg 70.4 kg 69 kg    Exam: In chair, NAD Eating breakfast Improved air movement on right side of heart No LE edema No rashes    Data Reviewed:   I have personally reviewed following labs and imaging studies:  Labs: Labs show the following:   Basic Metabolic Panel: Recent Labs  Lab 01/15/18 2030 01/16/18 0551 01/17/18 0535  NA 136 138 137  K 3.9 3.8 3.8  CL 100 101 102  CO2 23 26 26   GLUCOSE 132* 92 91  BUN 37* 34* 27*  CREATININE 1.81* 1.74* 1.61*  CALCIUM 8.1* 8.0* 7.9*   GFR Estimated Creatinine Clearance: 29.2 mL/min (A) (by C-G formula based on SCr of 1.61 mg/dL (H)). Liver Function Tests: Recent Labs  Lab 01/16/18 0551  AST 21  ALT 21  ALKPHOS 123  BILITOT 0.6  PROT 5.5*  ALBUMIN 2.4*   No results for input(s): LIPASE, AMYLASE in the last 168 hours. No results for input(s): AMMONIA in the last 168 hours. Coagulation profile No results for input(s): INR, PROTIME in the last 168 hours.  CBC: Recent Labs  Lab 01/15/18 2030 01/16/18 0551 01/17/18 0535  WBC 8.2 6.6 5.6  HGB 10.9* 10.2* 9.9*  HCT 34.7* 30.9*  31.4*  MCV 105.8* 102.0* 105.0*  PLT 167 151 163   Cardiac Enzymes: Recent Labs  Lab 01/15/18 2355 01/16/18 0551 01/16/18 1223  TROPONINI 0.08* 0.08* 0.08*   BNP (last 3 results) No results for input(s): PROBNP in the last 8760 hours. CBG: Recent Labs  Lab 01/17/18 1205  GLUCAP 101*   D-Dimer: No results for input(s): DDIMER in the last 72 hours. Hgb A1c: No results for input(s): HGBA1C in the last 72 hours. Lipid Profile: No results for input(s): CHOL, HDL, LDLCALC, TRIG, CHOLHDL, LDLDIRECT in the last 72 hours. Thyroid function studies: No results for input(s): TSH, T4TOTAL, T3FREE, THYROIDAB in the last 72 hours.  Invalid input(s): FREET3 Anemia work up: No results for input(s): VITAMINB12, FOLATE, FERRITIN, TIBC, IRON, RETICCTPCT in the last 72 hours. Sepsis Labs: Recent Labs  Lab 01/15/18 2030 01/16/18 0551 01/17/18 0535  WBC 8.2 6.6 5.6    Microbiology Recent Results (from the past 240 hour(s))  Gram stain     Status: None   Collection Time: 01/16/18  1:49 PM  Result Value Ref Range Status   Specimen Description PLEURAL  Final   Special Requests RIGHT  Final   Gram Stain   Final    MODERATE WBC PRESENT,BOTH PMN AND MONONUCLEAR NO ORGANISMS SEEN Performed at [facility], [address], [place], [place] [address]    Report Status 01/16/2018 FINAL  Final  Culture, body fluid-bottle     Status: None (Preliminary result)   Collection Time: 01/16/18  1:49 PM  Result Value Ref Range Status   Specimen Description PLEURAL FLUID  Final   Special Requests RIGHT  Final   Culture   Final    NO GROWTH 1 DAY Performed at [facility], [place] [address], [place], [place] [address]    Report Status PENDING  Incomplete    Procedures and diagnostic studies:  Dg Chest 1 View  Result Date: 01/16/2018 CLINICAL DATA:  Followup right thoracentesis. EXAM: CHEST  1 VIEW COMPARISON:  01/15/2018 FINDINGS: Less pleural fluid on the right. No pleural air.  Better aeration of the right lung. Mild persistent atelectasis at the left base. Loop recorder as seen previously. Cardiomegaly and aortic atherosclerosis. IMPRESSION: Less pleural fluid on the right.  No pneumothorax. Electronically Signed   By: Thomas Hooper M.D.   On: 01/16/2018 14:11   Dg Chest 2 View  Result Date: 01/15/2018 CLINICAL DATA:  Central chest pain EXAM: CHEST - 2 VIEW COMPARISON:  12/25/2017 FINDINGS: Cardiac shadow is enlarged in size and stable. Loop recorder is again noted. Right-sided pleural effusion is seen with likely underlying atelectatic changes. No other focal infiltrate is seen. IMPRESSION: Right-sided effusion with  underlying atelectatic changes. Electronically Signed   By: Inez Catalina M.D.   On: 01/15/2018 21:24   Ct Chest Wo Contrast  Result Date: 01/15/2018 CLINICAL DATA:  Chest pain and shortness of breath. EXAM: CT CHEST WITHOUT CONTRAST TECHNIQUE: Multidetector CT imaging of the chest was performed following the standard protocol without IV contrast. COMPARISON:  None. FINDINGS: Cardiovascular: Moderate cardiac enlargement. There is aortic atherosclerosis. No pericardial effusion. Calcifications within the LAD, left circumflex and RCA coronary arteries noted. Mediastinum/Nodes: The trachea appears patent and is midline. Moderate to large hiatal hernia noted. Normal appearance of the thyroid gland. No enlarged mediastinal or axillary adenopathy. Lungs/Pleura: There is a moderate to large right pleural effusion. Overlying passive atelectasis within the posterior right lower lobe identified. Small left pleural effusion is also identified. Passive atelectasis overlying the hiatal hernia noted in the left lower lobe. Patchy area of ground-glass attenuation within the lingula is identified compatible with nonspecific pneumonitis. Upper Abdomen: Calcified granulomas identified within the spleen. Previous cholecystectomy. Extensive atherosclerotic disease involves the abdominal  aorta and branch vessels. Musculoskeletal: Spondylosis noted within the thoracic spine. Previous right shoulder arthroplasty. Chronic appearing lateral right third rib fracture deformity noted. Age indeterminate fracture deformity involving the proximal body of sternum is identified, image 61/7. This may be related to recent CPR. Clinical correlation advised. Chronic L1 superior endplate compression fracture. IMPRESSION: 1. Moderate to large right pleural effusion with overlying passive atelectasis. 2. Small left pleural effusion. 3. Age indeterminate fracture deformity involving the proximal body of sternum. This may be related to recent CPR. Clinical correlation advised. 4. Chronic right third rib fracture and L1 superior endplate compression fracture. 5. Multi vessel coronary artery atherosclerotic calcifications. 6. Hiatal hernia. Aortic Atherosclerosis (ICD10-I70.0). Electronically Signed   By: Kerby Moors M.D.   On: 01/15/2018 23:39   Ct Head Code Stroke Wo Contrast  Result Date: 01/17/2018 CLINICAL DATA:  Code stroke. 84 year old male with altered mental status and no speech. EXAM: CT HEAD WITHOUT CONTRAST TECHNIQUE: Contiguous axial images were obtained from the base of the skull through the vertex without intravenous contrast. COMPARISON:  Head CT without contrast 12/29/2017 and earlier. FINDINGS: Brain: Stable cerebral volume. Stable ventricle size and configuration. No midline shift, mass effect, or evidence of intracranial mass lesion. Multiple small infarcts in the cerebellum appear stable. Mild for age cerebral white matter and deep gray matter hypodensity and heterogeneity is stable. No cortically based acute infarct identified. No acute intracranial hemorrhage identified. Vascular: Calcified atherosclerosis at the skull base. No suspicious intracranial vascular hyperdensity. Skull: No acute osseous abnormality identified. Sinuses/Orbits: Stable with chronic left sphenoid sinusitis including  periosteal thickening. Tympanic cavities and mastoids remain clear. Other: Generalized scalp vessel calcified atherosclerosis. No acute orbit or scalp soft tissue findings. ASPECTS ([place] Stroke Program Early CT Score) - Ganglionic level infarction (caudate, lentiform nuclei, internal capsule, insula, M1-M3 cortex): 7 - Supraganglionic infarction (M4-M6 cortex): 3 Total score (0-10 with 10 being normal): 10 IMPRESSION: 1.  Stable non contrast CT appearance of the brain since July. No acute cortically based infarct or hemorrhage identified. ASPECTS is 10. 2. Small chronic infarcts in the cerebellum, more so the left. Mild for age supratentorial small vessel disease. 3. These results were communicated to Dr. Rory Percy at 12:57 pmon 8/13/2019by text page via the [facility] messaging system. Electronically Signed   By: Genevie Ann M.D.   On: 01/17/2018 12:57   Ir Thoracentesis Asp Pleural Space W/img Guide  Result Date: 01/16/2018 INDICATION: Shortness of breath. Right-sided pleural  effusion. Request for diagnostic and therapeutic thoracentesis. EXAM: ULTRASOUND GUIDED RIGHT THORACENTESIS MEDICATIONS: None. COMPLICATIONS: None immediate. PROCEDURE: An ultrasound guided thoracentesis was thoroughly discussed with the patient and questions answered. The benefits, risks, alternatives and complications were also discussed. The patient understands and wishes to proceed with the procedure. Written consent was obtained. Ultrasound was performed to localize and mark an adequate pocket of fluid in the right chest. The area was then prepped and draped in the normal sterile fashion. 1% Lidocaine was used for local anesthesia. Under ultrasound guidance a 6 Fr Safe-T-Centesis catheter was introduced. Thoracentesis was performed. The catheter was removed and a dressing applied. FINDINGS: A total of approximately 1.1 L of amber colored fluid was removed. Samples were sent to the laboratory as requested by the clinical team. IMPRESSION:  Successful ultrasound guided right thoracentesis yielding 1.1 L of pleural fluid. Read by: Ascencion Dike PA-C Electronically Signed   By: Corrie Mckusick D.O.   On: 01/16/2018 16:19    Medications:   . allopurinol  100 mg Oral Daily  . atorvastatin  10 mg Oral QPM  . calcium-vitamin D  1 tablet Oral QPM  . docusate  100 mg Oral BID  . enoxaparin (LOVENOX) injection  30 mg Subcutaneous Q24H  . ferrous sulfate  325 mg Oral Daily  . furosemide  20 mg Oral Daily  . gabapentin  300 mg Oral TID  . [START ON 01/18/2018] levothyroxine  50 mcg Oral QAC breakfast  . metoprolol tartrate  25 mg Oral BID  . polyethylene glycol  17 g Oral Daily  . sertraline  25 mg Oral QHS  . sodium chloride flush  3 mL Intravenous Q12H  . vitamin B-12  1,000 mcg Oral Daily   Continuous Infusions: . sodium chloride       LOS: 0 days   Geradine Girt  [facility]   *Please refer to [email or web address], password [email or web address] to get updated schedule on who will round on this patient, as hospitalists switch teams weekly. If 7PM-7AM, please contact night-coverage at [email or web address], password [email or web address] for any overnight needs.  01/17/2018, 4:12 PM

## 2018-01-17 NOTE — Progress Notes (Addendum)
Patient was on toilet straining to have BM.  HR went down to 34 briefly and he became less responsive.  Suspect vasovagal.  Recent CVA and known a fib not anticoagulation candidate.   Stat head CT and will discuss with neuro.  ?EEG vs MRI.  Slowly recovering mental baseline but not back to his normal.    Plan:  -ortho consult to remove brace if possible--- for MRI of brain-- has been unable to make appointment -EEG -MRI of brain if brace able to be removed (has loop but is MRI compatible)  -bowel regimen -as he needs neuro checks, will need transferred to neuro tele per nursing  Eulogio Bear DO

## 2018-01-17 NOTE — Discharge Summary (Signed)
Physician Discharge Summary  Thomas Hooper. DCV:[id number] DOB: [birth year] DOA: 01/15/2018  PCP: Townsend Roger, MD  Admit date: 01/15/2018 Discharge date: 01/17/2018  Admitted From: SNF Discharge disposition: SNF   Recommendations for Outpatient Follow-Up:   1. Metoprolol dose increased for better HR control-- please monitor 2. PRN x ray to evaluate for pleural effusion (? Traumatic) 3. BMP 1 week 4. Lasix dose adjusted to 20 mg daily plus and extra 20 mg PRN 5. Resume palliative care talks with patient and family 6. Incentive spirometry 7. PRN O2 8. Final culture from thoracentesis pending, please follow   Discharge Diagnosis:   Principal Problem:   Chest pain Active Problems:   Paroxysmal atrial fibrillation (HCC)   CKD (chronic kidney disease) stage 3, GFR 30-59 ml/min (HCC)   Dementia   Anemia    Discharge Condition: Improved.  Diet recommendation: Low sodium, heart healthy  Wound care: None.  Code status: Full.   History of Present Illness:   Thomas Hooper  is a 84 y.o. male, w dementia, tia, ckd stage 3, Pafib, apparently c/o chest pain and sob while at [facility].  Pt has dementia so its difficult to tell if chest pain truly present or not.  Pt at current time denies chest pain.   In Ed,   ekg afib at 80, nl axis, slight st depression v5,6  CXR IMPRESSION: Right-sided effusion with underlying atelectatic changes.  Na 136, K 3.9, Bun37 creatinine 1.81  Wbc 8.2, Hgb 10.9, Plt 167 Trop 0.12  Pt will be admitted for chest pain and troponin elevation   Hospital Course by Problem:   Pleural effusion right (was originally thought to be from HF as he gained 10 lbs from 7/21-7/24 but based on lights criteria could be traumatic from CPR) -s/p thoracentesis- 1.1L removed -exudative based on labs- 1/2 criteria met -lasix scheduled and PRN-- monitor labs closely  Chest pain -recent echo in 7/19 -troponin flat -cardiology consult  appreciated- no further intervention  Pafib Cont metoprolol but increase dose Cont Aspirin No NOAC/coumadin due to fall risk  Hypertension Use metoprolol  Hyperlipidemia Cont Lipitor 47m po qhs  Gout Cont allopurinol 1027mpo qday  Hypothyroidism Cont levothyroxine  Anxiety Cont Zoloft 2512mo qday  Anemia PRN CBC  CKD stage 3 -monitor with BMPs  Chronic back pain Cont gabapentin  Deep tissues injury on heel  Dementia -appears to be at baseline   Overall poor prognosis, family met with palliative care last admission-- do not think another consult would be helpful in hospital-- palliative care was reccommended to follow at SNF -- continue to recommend    Medical Consultants:   cardiology   Discharge Exam:   Vitals:   01/17/18 0009 01/17/18 0449  BP: 120/77 140/80  Pulse: 89 87  Resp: 16 16  Temp: 97.8 F (36.6 C) 97.6 F (36.4 C)  SpO2: 96% 97%   Vitals:   01/16/18 1645 01/16/18 2131 01/17/18 0009 01/17/18 0449  BP: 120/86 (!) 149/83 120/77 140/80  Pulse: 79 76 89 87  Resp: 19 16 16 16   Temp: (!) 97.5 F (36.4 C) 98.1 F (36.7 C) 97.8 F (36.6 C) 97.6 F (36.4 C)  TempSrc: Oral Oral Oral Oral  SpO2: 97% 100% 96% 97%  Weight:    69 kg  Height:        General exam: Appears calm and comfortable.  Slow to respond at times-- ? hearing  The results of significant diagnostics from this  hospitalization (including imaging, microbiology, ancillary and laboratory) are listed below for reference.     Procedures and Diagnostic Studies:   Dg Chest 1 View  Result Date: 01/16/2018 CLINICAL DATA:  Followup right thoracentesis. EXAM: CHEST  1 VIEW COMPARISON:  01/15/2018 FINDINGS: Less pleural fluid on the right. No pleural air. Better aeration of the right lung. Mild persistent atelectasis at the left base. Loop recorder as seen previously. Cardiomegaly and aortic atherosclerosis. IMPRESSION: Less pleural fluid on the right.  No  pneumothorax. Electronically Signed   By: Nelson Chimes M.D.   On: 01/16/2018 14:11   Dg Chest 2 View  Result Date: 01/15/2018 CLINICAL DATA:  Central chest pain EXAM: CHEST - 2 VIEW COMPARISON:  12/25/2017 FINDINGS: Cardiac shadow is enlarged in size and stable. Loop recorder is again noted. Right-sided pleural effusion is seen with likely underlying atelectatic changes. No other focal infiltrate is seen. IMPRESSION: Right-sided effusion with underlying atelectatic changes. Electronically Signed   By: Inez Catalina M.D.   On: 01/15/2018 21:24   Ct Chest Wo Contrast  Result Date: 01/15/2018 CLINICAL DATA:  Chest pain and shortness of breath. EXAM: CT CHEST WITHOUT CONTRAST TECHNIQUE: Multidetector CT imaging of the chest was performed following the standard protocol without IV contrast. COMPARISON:  None. FINDINGS: Cardiovascular: Moderate cardiac enlargement. There is aortic atherosclerosis. No pericardial effusion. Calcifications within the LAD, left circumflex and RCA coronary arteries noted. Mediastinum/Nodes: The trachea appears patent and is midline. Moderate to large hiatal hernia noted. Normal appearance of the thyroid gland. No enlarged mediastinal or axillary adenopathy. Lungs/Pleura: There is a moderate to large right pleural effusion. Overlying passive atelectasis within the posterior right lower lobe identified. Small left pleural effusion is also identified. Passive atelectasis overlying the hiatal hernia noted in the left lower lobe. Patchy area of ground-glass attenuation within the lingula is identified compatible with nonspecific pneumonitis. Upper Abdomen: Calcified granulomas identified within the spleen. Previous cholecystectomy. Extensive atherosclerotic disease involves the abdominal aorta and branch vessels. Musculoskeletal: Spondylosis noted within the thoracic spine. Previous right shoulder arthroplasty. Chronic appearing lateral right third rib fracture deformity noted. Age  indeterminate fracture deformity involving the proximal body of sternum is identified, image 61/7. This may be related to recent CPR. Clinical correlation advised. Chronic L1 superior endplate compression fracture. IMPRESSION: 1. Moderate to large right pleural effusion with overlying passive atelectasis. 2. Small left pleural effusion. 3. Age indeterminate fracture deformity involving the proximal body of sternum. This may be related to recent CPR. Clinical correlation advised. 4. Chronic right third rib fracture and L1 superior endplate compression fracture. 5. Multi vessel coronary artery atherosclerotic calcifications. 6. Hiatal hernia. Aortic Atherosclerosis (ICD10-I70.0). Electronically Signed   By: Kerby Moors M.D.   On: 01/15/2018 23:39   Ir Thoracentesis Asp Pleural Space W/img Guide  Result Date: 01/16/2018 INDICATION: Shortness of breath. Right-sided pleural effusion. Request for diagnostic and therapeutic thoracentesis. EXAM: ULTRASOUND GUIDED RIGHT THORACENTESIS MEDICATIONS: None. COMPLICATIONS: None immediate. PROCEDURE: An ultrasound guided thoracentesis was thoroughly discussed with the patient and questions answered. The benefits, risks, alternatives and complications were also discussed. The patient understands and wishes to proceed with the procedure. Written consent was obtained. Ultrasound was performed to localize and mark an adequate pocket of fluid in the right chest. The area was then prepped and draped in the normal sterile fashion. 1% Lidocaine was used for local anesthesia. Under ultrasound guidance a 6 Fr Safe-T-Centesis catheter was introduced. Thoracentesis was performed. The catheter was removed and a dressing applied. FINDINGS: A  total of approximately 1.1 L of amber colored fluid was removed. Samples were sent to the laboratory as requested by the clinical team. IMPRESSION: Successful ultrasound guided right thoracentesis yielding 1.1 L of pleural fluid. Read by: Ascencion Dike PA-C Electronically Signed   By: Corrie Mckusick D.O.   On: 01/16/2018 16:19     Labs:   Basic Metabolic Panel: Recent Labs  Lab 01/15/18 2030 01/16/18 0551 01/17/18 0535  NA 136 138 137  K 3.9 3.8 3.8  CL 100 101 102  CO2 23 26 26   GLUCOSE 132* 92 91  BUN 37* 34* 27*  CREATININE 1.81* 1.74* 1.61*  CALCIUM 8.1* 8.0* 7.9*   GFR Estimated Creatinine Clearance: 29.2 mL/min (A) (by C-G formula based on SCr of 1.61 mg/dL (H)). Liver Function Tests: Recent Labs  Lab 01/16/18 0551  AST 21  ALT 21  ALKPHOS 123  BILITOT 0.6  PROT 5.5*  ALBUMIN 2.4*   No results for input(s): LIPASE, AMYLASE in the last 168 hours. No results for input(s): AMMONIA in the last 168 hours. Coagulation profile No results for input(s): INR, PROTIME in the last 168 hours.  CBC: Recent Labs  Lab 01/15/18 2030 01/16/18 0551 01/17/18 0535  WBC 8.2 6.6 5.6  HGB 10.9* 10.2* 9.9*  HCT 34.7* 30.9* 31.4*  MCV 105.8* 102.0* 105.0*  PLT 167 151 163   Cardiac Enzymes: Recent Labs  Lab 01/15/18 2355 01/16/18 0551 01/16/18 1223  TROPONINI 0.08* 0.08* 0.08*   BNP: Invalid input(s): POCBNP CBG: No results for input(s): GLUCAP in the last 168 hours. D-Dimer No results for input(s): DDIMER in the last 72 hours. Hgb A1c No results for input(s): HGBA1C in the last 72 hours. Lipid Profile No results for input(s): CHOL, HDL, LDLCALC, TRIG, CHOLHDL, LDLDIRECT in the last 72 hours. Thyroid function studies No results for input(s): TSH, T4TOTAL, T3FREE, THYROIDAB in the last 72 hours.  Invalid input(s): FREET3 Anemia work up No results for input(s): VITAMINB12, FOLATE, FERRITIN, TIBC, IRON, RETICCTPCT in the last 72 hours. Microbiology Recent Results (from the past 240 hour(s))  Gram stain     Status: None   Collection Time: 01/16/18  1:49 PM  Result Value Ref Range Status   Specimen Description PLEURAL  Final   Special Requests RIGHT  Final   Gram Stain   Final    MODERATE WBC  PRESENT,BOTH PMN AND MONONUCLEAR NO ORGANISMS SEEN Performed at [facility], [address] [place], [place] [address]    Report Status 01/16/2018 FINAL  Final     Discharge Instructions:   Discharge Instructions    (HEART FAILURE PATIENTS) Call MD:  Anytime you have any of the following symptoms: 1) 3 pound weight gain in 24 hours or 5 pounds in 1 week 2) shortness of breath, with or without a dry hacking cough 3) swelling in the hands, feet or stomach 4) if you have to sleep on extra pillows at night in order to breathe.   Complete by:  As directed    Diet - low sodium heart healthy   Complete by:  As directed    Increase activity slowly   Complete by:  As directed      Allergies as of 01/17/2018      Reactions   Prednisone Other (See Comments)   Causes Afib   Morphine And Related Other (See Comments)   Agitation, "made him mean" per granddaughter   Penicillins Other (See Comments)   Has patient had a PCN  reaction causing immediate rash, facial/tongue/throat swelling, SOB or lightheadedness with hypotension: Unknown Has patient had a PCN reaction causing severe rash involving mucus membranes or skin necrosis: Unknown Has patient had a PCN reaction that required hospitalization: Unknown Has patient had a PCN reaction occurring within the last 10 years: Unknown If all of the above answers are "NO", then may proceed with Cephalosporin use.      Medication List    STOP taking these medications   amLODipine 5 MG tablet Commonly known as:  NORVASC   gabapentin 600 MG tablet Commonly known as:  NEURONTIN     TAKE these medications   acetaminophen 325 MG tablet Commonly known as:  TYLENOL Take 650 mg by mouth every 6 (six) hours as needed (pain).   allopurinol 100 MG tablet Commonly known as:  ZYLOPRIM Take 100 mg by mouth daily.   aspirin EC 81 MG tablet Take 81 mg by mouth daily.   atorvastatin 10 MG tablet Commonly known as:  LIPITOR Take 10 mg by mouth  every evening.   CALCIUM 600+D 600-400 MG-UNIT tablet Generic drug:  Calcium Carbonate-Vitamin D Take 1 tablet by mouth every evening.   docusate 50 MG/5ML liquid Commonly known as:  COLACE Take 100 mg by mouth 2 (two) times daily.   ferrous sulfate 325 (65 FE) MG tablet Take 325 mg by mouth daily.   Fish Oil 1000 MG Caps Take 1,000 mg by mouth every evening.   furosemide 20 MG tablet Commonly known as:  LASIX 1 po daily with an extra PRN for edema (total of 40 mg/day if needed) What changed:    how much to take  how to take this  additional instructions   levothyroxine 50 MCG tablet Commonly known as:  SYNTHROID, LEVOTHROID Take 50 mcg by mouth daily at 6 (six) AM.   metoprolol tartrate 25 MG tablet Commonly known as:  LOPRESSOR Take 1 tablet (25 mg total) by mouth 2 (two) times daily. 9am, 9pm What changed:  how much to take   NUTRITIONAL SUPPLEMENT Liqd Take 120 mLs by mouth 2 (two) times daily. MedPass   nystatin 100000 UNIT/ML suspension Commonly known as:  MYCOSTATIN Use as directed 15 mLs in the mouth or throat See admin instructions. Swish and spit 15 ml every 6 hours as needed for mouth pain   sertraline 25 MG tablet Commonly known as:  ZOLOFT Take 25 mg by mouth at bedtime.   vitamin B-12 500 MCG tablet Commonly known as:  CYANOCOBALAMIN Take 1,000 mcg by mouth daily.      Follow-up Information    Townsend Roger, MD Follow up in 1 week(s).   Specialty:  Internal Medicine Contact information: [place] [address] [phone]          Updated granddaughter by phone  Time coordinating discharge: 25 min  Signed:  Geradine Girt  [facility] 01/17/2018, 9:49 AM

## 2018-01-17 NOTE — Significant Event (Signed)
Rapid Response Event Note  Overview: Time Called: 1200 Arrival Time: 1204 Event Type: Neurologic  Initial Focused Assessment: Received call regarding patient who became unresponsive and bradycardic after having a BM. Upon arrival to bedside, patient on zoll monitor. Atrial fibrillation on monitor with heart rate 100-110. Patient with eyes open, pupils small but equal. Patient initially not following commands, not answering questions, just staring straight ahead. Eventually patient calls out for "Corene Cornea" "get Corene Cornea". I ask family present in hall if there is someone named Corene Cornea present. Corene Cornea is patient's grandson-in-law. After Corene Cornea is at bedside, patient states his name, but unable to state birthday or month. When asked if he knows where he is, he shakes his head "no".  Patient able to lift right arm, but does not follow any other commands. Blood sugar taken and is WNL (101). Patient mildly hypertensive, normal oxygen levels. Concerned for possible CVA at this time. Paged Dr Eliseo Squires as to whether to just order stat head CT vs full code stroke initiation. Stat head CT ordered at this time.  While awaiting CT scanner, patient more lethargic.Sternal rubbed to illicit response. Patient then stated, "I'm ready to go." I replied, "go where, you ready to go home?" Patient responded by saying "I'm ready to go to my heavenly home." Discussed with patient code status, must let us know if he wants to change from full code. Patient never responded afterwards. Discussed this with patient's granddaughter, grandson-in-law, and Dr Eliseo Squires.  Interventions: Stat head CT ordered and patient taken to [place] with RRT and charge nurse. Neurology consulted by Dr Eliseo Squires. Place monitor back on patient (was off for discharge today).  Plan of Care (if not transferred): Continue to monitor and assess for neuro changes Call as needed  Event Summary:   called at 1200      arrived at 1204     ended at [place]

## 2018-01-17 NOTE — Consult Note (Signed)
Consultation Note Date: 01/17/2018   Patient Name: Thomas Hooper.  DOB: January 16, 1925  MRN: [id number]  Age / Sex: 84 y.o., male  PCP: Townsend Roger, MD Referring Physician: Geradine Girt, DO  Reason for Consultation: Establishing goals of care and Psychosocial/spiritual support  HPI/Patient Profile: 84 y.o. male  with past medical history of dementia, afib, heart failure (EF 40%), CKD3, thyroid disease, TIA, multiple falls, seizure activity, and a recent admission for a dislocated hip prosthesis who was admitted on 01/15/2018 with shortness of breath.  He underwent thoracentesis and was much improved.  On the day of discharge he had an unresponsive episode.  MRI and EEG are pending.   Clinical Assessment and Goals of Care:  I have reviewed medical records including EPIC notes, labs and imaging, received report from Dr. Eliseo Squires, assessed the patient and then met at the bedside along with his grand daughter [name] and grand son in law  to discuss diagnosis prognosis, [place], EOL wishes, disposition and options.  I introduced Palliative Medicine as specialized medical care for people living with serious illness. It focuses on providing relief from the symptoms and stress of a serious illness. The goal is to improve quality of life for both the patient and the family.  We discussed a brief life review of the patient. He has lived in [place] all of his life.  He was living next door to [place] and she is his primary care giver.  [name] is a 911 dispatcher and her husband is a Engineer, structural.  As far as functional and nutritional status he stopped walking in July when his hip was dislocated.  He is still eating well.  [name] and her husband tell me that it is not unusual for Mr. Privott to change presentation rather suddenly.  For example he will go to sleep and when he wakes up he will be completely different - sometimes mean, sometimes  helpless.  [name] feels he does it for attention or it could be part of his dementia.   They feel that his unresponsive episode today was likely attention seeking.  We discussed his current illness and what it means in the larger context of his on-going co-morbidities.  Natural disease trajectory and expectations at EOL were discussed. Specifically we discussed heart failure in combination with kidney failure.  [name] understands that eventually it becomes a balancing act.    I attempted to elicit values and goals of care important to the patient.  [name] and her husband were very clear that Mr. Sane has always said "I want to live, Do everything".  Out of respect for his wishes they have left his code status as full code.  Recently Mr. Lembke has also commented that he "Never wants to be hooked up to machines".   The difference between aggressive medical intervention and comfort care was considered in light of the patient's goals of care.  We reviewed a MOST form and discussed DNR with Full Scope Treatment.  Meaning that if he were to decline  he could come to the hospital and receive full treatment with the exception of ACLS and intubation.  No decisions were made but [name] wishes to consider the question further.  Advanced directives, concepts specific to code status, artifical feeding and hydration, and rehospitalization were considered and discussed. Hospice and Palliative Care services outpatient were explained and offered.  Questions and concerns were addressed.  Hard Choices booklet left for review. The family was encouraged to call with questions or concerns.    Primary Decision Maker:  NEXT OF KIN [name] daughter)    SUMMARY OF RECOMMENDATIONS    PMT will follow up 8/14.  Code Status/Advance Care Planning:  Full code  Prognosis:  Less than 12 months given multiple falls.  No longer ambulatory.  Advancing dementia.  Systolic heart failure.  Seizure activity.    Discharge  Planning: [place] for rehab with Palliative care service follow-up      Primary Diagnoses: Present on Admission: . Chest pain . Dementia . CKD (chronic kidney disease) stage 3, GFR 30-59 ml/min (HCC) . Paroxysmal atrial fibrillation (HCC) . Anemia   I have reviewed the medical record, interviewed the patient and family, and examined the patient. The following aspects are pertinent.  Past Medical History:  Diagnosis Date  . Atrial fibrillation ([place])   . Chronic kidney disease    Stage 3  . Dementia   . Hypertension   . Neuropathy   . Thyroid disease   . TIA (transient ischemic attack)    Social History   Socioeconomic History  . Marital status: Widowed    Spouse name: Not on file  . Number of children: Not on file  . Years of education: Not on file  . Highest education level: Not on file  Occupational History  . Not on file  Social Needs  . Financial resource strain: Not on file  . Food insecurity:    Worry: Not on file    Inability: Not on file  . Transportation needs:    Medical: Not on file    Non-medical: Not on file  Tobacco Use  . Smoking status: Never Smoker  . Smokeless tobacco: Never Used  Substance and Sexual Activity  . Alcohol use: No  . Drug use: No  . Sexual activity: Not on file  Lifestyle  . Physical activity:    Days per week: Not on file    Minutes per session: Not on file  . Stress: Not on file  Relationships  . Social connections:    Talks on phone: Not on file    Gets together: Not on file    Attends religious service: Not on file    Active member of club or organization: Not on file    Attends meetings of clubs or organizations: Not on file    Relationship status: Not on file  Other Topics Concern  . Not on file  Social History Narrative  . Not on file   Family History  Problem Relation Age of Onset  . Cancer Father   . CAD Brother    Scheduled Meds: . allopurinol  100 mg Oral Daily  . atorvastatin  10  mg Oral QPM  . calcium-vitamin D  1 tablet Oral QPM  . docusate  100 mg Oral BID  . enoxaparin (LOVENOX) injection  30 mg Subcutaneous Q24H  . ferrous sulfate  325 mg Oral Daily  . furosemide  20 mg Oral Daily  . gabapentin  300 mg Oral TID  . [  START ON 01/18/2018] levothyroxine  50 mcg Oral QAC breakfast  . metoprolol tartrate  25 mg Oral BID  . polyethylene glycol  17 g Oral Daily  . sertraline  25 mg Oral QHS  . sodium chloride flush  3 mL Intravenous Q12H  . vitamin B-12  1,000 mcg Oral Daily   Continuous Infusions: . sodium chloride     PRN Meds:.sodium chloride, acetaminophen **OR** acetaminophen, bisacodyl, lidocaine, sodium chloride flush Allergies  Allergen Reactions  . Prednisone Other (See Comments)    Causes Afib  . Morphine And Related Other (See Comments)    Agitation, "made him mean" per granddaughter  . Penicillins Other (See Comments)    Has patient had a PCN reaction causing immediate rash, facial/tongue/throat swelling, SOB or lightheadedness with hypotension: Unknown Has patient had a PCN reaction causing severe rash involving mucus membranes or skin necrosis: Unknown Has patient had a PCN reaction that required hospitalization: Unknown Has patient had a PCN reaction occurring within the last 10 years: Unknown If all of the above answers are "NO", then may proceed with Cephalosporin use.    Review of Systems patient denies pain, SOB, changes in bowel habits.  He is pleasantly demented and may not be an accurate historian.  Physical Exam  Well developed awake, alert, clear speech, mildly confused (thought his was hospitalized for his hip) CV tachy resp no distress Abdomen (Brace in place), soft, nt, nd, +bs Ext left upper ext with 1-2+ edema.  No edema in lower ext.  Vital Signs: BP (!) 154/96 (BP Location: Right Arm)   Pulse (!) 114   Temp 97.6 F (36.4 C) (Oral)   Resp 19   Ht 5' 10"  (1.778 m)   Wt 69 kg   SpO2 100%   BMI 21.83 kg/m  Pain Scale:  0-10   Pain Score: 0-No pain   SpO2: SpO2: 100 % O2 Device:SpO2: 100 % O2 Flow Rate: .   IO: Intake/output summary:   Intake/Output Summary (Last 24 hours) at 01/17/2018 1457 Last data filed at 01/17/2018 0919 Gross per 24 hour  Intake 660 ml  Output 775 ml  Net -115 ml    LBM: Last BM Date: 01/14/18 Baseline Weight: Weight: 71.1 kg Most recent weight: Weight: 69 kg     Palliative Assessment/Data: 30%     Time In: 4:00 Time Out: 5:00 Time Total: 60 min. Greater than 50%  of this time was spent counseling and coordinating care related to the above assessment and plan.  Signed by: Florentina Jenny, PA-C Palliative Medicine Pager: [phone]  Please contact Palliative Medicine Team phone at [phone] for questions and concerns.  For individual provider: See Shea Evans

## 2018-01-17 NOTE — Progress Notes (Signed)
Patient was found by nurse tech not responding to verbal commands when he had been. Patient was attempting to have bowel movement. Heart rated went down to 34. Patient now on zoll. Dr. Eliseo Squires paged. Rapid response called and at bedsie. Vital signs now stable with heart rate of 102 and blood pressure of 159/114

## 2018-01-17 NOTE — Evaluation (Signed)
Physical Therapy Evaluation Patient Details Name: Thomas Hooper. MRN: [id number] DOB: [birth year] Today's Date: 01/17/2018   History of Present Illness  84 yo admitted with Chest pain and SOB with Afib s/p thoracentesis 8/12. PMHx: dementia, TIA, CKD stage 3, Afib, HTN, bil THA, bil TKA, dislocated left hip with closed reduction 12/23/17  Clinical Impression  Arrived to pt lying supine, pt pleasant and agreeable to participate in therapy and "try my best to do anything you tell me too." Pt HOH, with confusion and memory loss, with frequent verbal cues to stay on task. Pt unable to provide history, with no family present to confirm living arrangements. Pt requires min to mod a +2 for transfers and safety adherence to left hip posterior hip precautions. Pt has decreased strength and mobility of L LE and decreased trunk control. Pt will benefit from skilled physical therapy to address above deficits to decrease fall risk and improve functional mobility.          Follow Up Recommendations SNF;Supervision/Assistance - 24 hour    Equipment Recommendations  None recommended by PT    Recommendations for Other Services       Precautions / Restrictions Precautions Precautions: Fall;Posterior Hip Precaution Booklet Issued: Yes (comment) Precaution Comments: precautions posted at [facility] Other Brace/Splint: L hip abduction brace at all times except for hygiene.  Restrictions LLE Weight Bearing: Weight bearing as tolerated      Mobility  Bed Mobility Overal bed mobility: Needs Assistance Bed Mobility: Supine to Sit     Supine to sit: Min assist;+2 for safety/equipment     General bed mobility comments: min assist with increased time pt able to pivot legs to EOB with min assist to complete pivot, cues and slight assist to elevate trunk  Transfers Overall transfer level: Needs assistance   Transfers: Sit to/from Stand;Stand Pivot Transfers Sit to Stand: Mod assist;+2 physical assistance Stand  pivot transfers: +2 physical assistance;Mod assist       General transfer comment: cues for LLE position, hand placement with assist to rise. PT with posterior lean with assist for posture and balance, With RW pt able shuffle feet to step to chair with cues and 2 person assist for safety. Allowed pt to sit before backing to surface to maintain hip precautions  Ambulation/Gait             General Gait Details: unable at this time  Stairs            Wheelchair Mobility    Modified Rankin (Stroke Patients Only)       Balance Overall balance assessment: Needs assistance Sitting-balance support: Bilateral upper extremity supported Sitting balance-Leahy Scale: Poor Sitting balance - Comments: pt able to sit EOB with min assist for balance   Standing balance support: Bilateral upper extremity supported Standing balance-Leahy Scale: Poor Standing balance comment: stood at walker mod a x2, significant posterior lean                              Pertinent Vitals/Pain Pain Assessment: No/denies pain    Home Living Family/patient expects to be discharged to:: Skilled nursing facility                      Prior Function           Comments: Unsure of pt's home set up or PLOF. He is a poor historian and no family present. max +2 since dislocation  Hand Dominance        Extremity/Trunk Assessment   Upper Extremity Assessment Upper Extremity Assessment: Generalized weakness    Lower Extremity Assessment Lower Extremity Assessment: Generalized weakness LLE Deficits / Details: hip abduction brace in place throughout eval    Cervical / Trunk Assessment Cervical / Trunk Assessment: Kyphotic  Communication   Communication: HOH  Cognition Arousal/Alertness: Awake/alert Behavior During Therapy: WFL for tasks assessed/performed Overall Cognitive Status: No family/caregiver present to determine baseline cognitive functioning Area of  Impairment: Orientation;Attention;Memory;Following commands;Safety/judgement;Problem solving;Awareness                 Orientation Level: Disoriented to;Place;Time;Situation Current Attention Level: Sustained Memory: Decreased short-term memory Following Commands: Follows one step commands with increased time Safety/Judgement: Decreased awareness of safety;Decreased awareness of deficits Awareness: Intellectual Problem Solving: Slow processing;Difficulty sequencing;Requires verbal cues;Requires tactile cues General Comments: pt very pleasant, stating year as 1990      General Comments      Exercises Total Joint Exercises Heel Slides: AROM;10 reps;Right Hip ABduction/ADduction: PROM;15 reps;Right(stop at neutral for posterior hip precautions )   Assessment/Plan    PT Assessment Patient needs continued PT services  PT Problem List Decreased mobility;Decreased safety awareness;Decreased knowledge of precautions;Decreased activity tolerance;Decreased cognition;Decreased balance;Decreased knowledge of use of DME       PT Treatment Interventions DME instruction;Therapeutic activities;Cognitive remediation;Gait training;Therapeutic exercise;Patient/family education;Balance training;Functional mobility training    PT Goals (Current goals can be found in the Care Plan section)  Acute Rehab PT Goals Patient Stated Goal: not stated  PT Goal Formulation: Patient unable to participate in goal setting Time For Goal Achievement: 01/31/18 Potential to Achieve Goals: Fair    Frequency Min 2X/week   Barriers to discharge   cognition and decreased safety awareness with hip precautions    Co-evaluation               AM-PAC PT "6 Clicks" Daily Activity  Outcome Measure Difficulty turning over in bed (including adjusting bedclothes, sheets and blankets)?: Unable Difficulty moving from lying on back to sitting on the side of the bed? : Unable Difficulty sitting down on and  standing up from a chair with arms (e.g., wheelchair, bedside commode, etc,.)?: Unable Help needed moving to and from a bed to chair (including a wheelchair)?: A Lot Help needed walking in hospital room?: Total Help needed climbing 3-5 steps with a railing? : Total 6 Click Score: 7    End of Session Equipment Utilized During Treatment: Gait belt;Other (comment)(hip abduction brace) Activity Tolerance: Patient tolerated treatment well;Patient limited by fatigue Patient left: in chair;with call bell/phone within reach;with chair alarm set Nurse Communication: Mobility status;Need for lift equipment(steady lift with limit in hip flexion ) PT Visit Diagnosis: Other abnormalities of gait and mobility (R26.89);Muscle weakness (generalized) (M62.81)    Time: 9211-9417 PT Time Calculation (min) (ACUTE ONLY): 22 min   Charges:   PT Evaluation $PT Eval Moderate Complexity: [employer], [place]  Acute Rehab [phone]   Samuella Bruin 01/17/2018, 10:15 AM

## 2018-01-17 NOTE — NC FL2 (Signed)
[place] LEVEL OF CARE SCREENING TOOL     IDENTIFICATION  Patient Name: Thomas Hooper. Birthdate: 1925-02-18 Sex: male Admission Date (Current Location): 01/15/2018  [facility] and [place] Number:  [employer] and Address:  [facility], [place] [address], [place], [place] [address]      Provider Number: [id number]  Attending Physician Name and Address:  Geradine Girt, DO  Relative Name and Phone Number:       Current Level of Care: Hospital Recommended Level of Care: [place] Prior Approval Number:    Date Approved/Denied:   PASRR Number: [id number]  Discharge Plan: SNF    Current Diagnoses: Patient Active Problem List   Diagnosis Date Noted  . Chest pain 01/15/2018  . Anemia 01/15/2018  . Rib fractures 12/25/2017  . Seizure ([place])   . Status post closed reduction of dislocated total hip prosthesis 12/23/2017  . Degeneration of intervertebral disc of cervical region 10/14/2017  . Neck pain 10/14/2017  . Peripheral neuropathy 10/14/2017  . Spondylosis of cervical region without myelopathy or radiculopathy 10/14/2017  . Chronic anticoagulation 02/28/2017  . Dementia 02/28/2017  . Cardiomyopathy ([place]) 02/28/2017  . TIA (transient ischemic attack) 02/28/2017  . CKD (chronic kidney disease) stage 3, GFR 30-59 ml/min (HCC) 02/24/2017  . Gout 02/24/2017  . Hypertensive heart and kidney disease 05/19/2016  . Hypothyroidism (acquired) 05/19/2016  . Paroxysmal atrial fibrillation ([place]) 05/19/2016  . Essential hypertension 05/19/2016    Orientation RESPIRATION BLADDER Height & Weight     Self, Time  Normal Continent, External catheter Weight: 152 lb 1.9 oz (69 kg) Height:  5\' 10"  (177.8 cm)  BEHAVIORAL SYMPTOMS/MOOD NEUROLOGICAL BOWEL NUTRITION STATUS  (None) (Dementia) Continent Diet(Heart healthy)  AMBULATORY STATUS COMMUNICATION OF NEEDS Skin   Extensive Assist Verbally Skin abrasions, Bruising, Other  (Comment), Surgical wounds(Deep tissue injury on left heel: Foam prn.)                       Personal Care Assistance Level of Assistance              Functional Limitations Info  Sight, Hearing, Speech Sight Info: Adequate Hearing Info: Adequate Speech Info: Adequate    SPECIAL CARE FACTORS FREQUENCY  PT (By licensed PT)     PT Frequency: 5 x week              Contractures Contractures Info: Not present    Additional Factors Info  Code Status, Allergies Code Status Info: Full code Allergies Info: Prednisone, Morphine And Related, Penicillins           Current Medications (01/17/2018):  This is the current hospital active medication list Current Facility-Administered Medications  Medication Dose Route Frequency Provider Last Rate Last Dose  . 0.9 %  sodium chloride infusion  250 mL Intravenous PRN Jani Gravel, MD      . acetaminophen (TYLENOL) tablet 650 mg  650 mg Oral Q6H PRN Jani Gravel, MD   650 mg at 01/16/18 2302   Or  . acetaminophen (TYLENOL) suppository 650 mg  650 mg Rectal Q6H PRN Jani Gravel, MD      . allopurinol (ZYLOPRIM) tablet 100 mg  100 mg Oral Daily Eulogio Bear U, DO   100 mg at 01/17/18 0957  . atorvastatin (LIPITOR) tablet 10 mg  10 mg Oral QPM Vann, Jessica U, DO      . calcium-vitamin D 500-200 MG-UNIT per tablet 1 tablet  1 tablet Oral QPM Vann, Jessica U, DO      . docusate (COLACE) 50 MG/5ML liquid 100 mg  100 mg Oral BID Vann, Jessica U, DO      . enoxaparin (LOVENOX) injection 30 mg  30 mg Subcutaneous Q24H Jani Gravel, MD   30 mg at 01/17/18 0955  . ferrous sulfate tablet 325 mg  325 mg Oral Daily Eulogio Bear U, DO   325 mg at 01/17/18 0957  . furosemide (LASIX) tablet 20 mg  20 mg Oral Daily Eulogio Bear U, DO   20 mg at 01/17/18 0957  . [START ON 01/18/2018] levothyroxine (SYNTHROID, LEVOTHROID) tablet 50 mcg  50 mcg Oral QAC breakfast Vann, Jessica U, DO      . lidocaine (XYLOCAINE) 2 % injection    PRN Ascencion Dike, PA-C    10 mL at 01/16/18 1343  . metoprolol tartrate (LOPRESSOR) tablet 12.5 mg  12.5 mg Oral BID Eulogio Bear U, DO   12.5 mg at 01/17/18 0958  . sertraline (ZOLOFT) tablet 25 mg  25 mg Oral QHS Vann, Jessica U, DO      . sodium chloride flush (NS) 0.9 % injection 3 mL  3 mL Intravenous Q12H Jani Gravel, MD   3 mL at 01/17/18 1001  . sodium chloride flush (NS) 0.9 % injection 3 mL  3 mL Intravenous PRN Jani Gravel, MD      . vitamin B-12 (CYANOCOBALAMIN) tablet 1,000 mcg  1,000 mcg Oral Daily Eulogio Bear U, DO   1,000 mcg at 01/17/18 5189     Discharge Medications: Please see discharge summary for a list of discharge medications.  Relevant Imaging Results:  Relevant Lab Results:   Additional Information SS#: [id number]  Candie Chroman, LCSW

## 2018-01-17 NOTE — Clinical Social Work Note (Signed)
Clinical Social Work Assessment  Patient Details  Name: Thomas Hooper. MRN: [id number] Date of Birth: [birth year]  Date of referral:  01/17/18               Reason for consult:  Discharge Planning                Permission sought to share information with:  Facility Sport and exercise psychologist, Family Supports Permission granted to share information::  Yes, Verbal Permission Granted  Name::     [name]  Agency::  [facility]  Relationship::  Granddaughter  Contact Information:  [phone]  Housing/Transportation Living arrangements for the past 2 months:  [place] of Information:  Medical Team, Facility, Other (Comment Required)(Granddaughter) Patient Interpreter Needed:  None Criminal Activity/Legal Involvement Pertinent to Current Situation/Hospitalization:  No - Comment as needed Significant Relationships:  Other Family Members(Granddaughter) Lives with:  Self Do you feel safe going back to the place where you live?  Yes Need for family participation in patient care:  Yes (Comment)  Care giving concerns:  Patient is a short-term rehab resident at [facility]. PT recommending continued SNF at discharge.   Social Worker assessment / plan:  Patient not fully oriented. No supports at bedside. CSW called granddaughter, introduced role, and explained that PT recommendations would be discussed. Patient's granddaughter confirmed he was admitted from [facility] in [place] and is agreeable to him returning for further rehab. She would like to transport him by car but will make sure RN is okay with it first. CSW left voicemail for SNF admissions coordinator. She is in a meeting. No further concerns. CSW encouraged patient's granddaughter to contact CSW as needed. CSW will continue to follow patient and his granddaughter for support and facilitate discharge back to SNF today once admissions coordinator calls back.  Employment status:   Retired Forensic scientist:  Medicare PT Recommendations:  [place] / Referral to community resources:  [place]  Patient/Family's Response to care:  Patient not fully oriented. Patient's granddaughter agreeable to return to SNF. Patient's granddaughter supportive and involved in patient's care. Patient's granddaughter appreciated social work intervention.  Patient/Family's Understanding of and Emotional Response to Diagnosis, Current Treatment, and Prognosis:  Patient not fully oriented. Patient's granddaughter has a good understanding of the reason for admission and plan for return to SNF today. Patient's granddaughter appears happy with hospital care.  Emotional Assessment Appearance:  Appears stated age Attitude/Demeanor/Rapport:  Unable to Assess Affect (typically observed):  Unable to Assess Orientation:  Oriented to Self, Oriented to  Time Alcohol / Substance use:  Never Used Psych involvement (Current and /or in the community):  No (Comment)  Discharge Needs  Concerns to be addressed:  Care Coordination Readmission within the last 30 days:  Yes Current discharge risk:  Dependent with Mobility Barriers to Discharge:  Other(Waiting on call back )   Candie Chroman, LCSW 01/17/2018, 10:32 AM

## 2018-01-17 NOTE — Telephone Encounter (Signed)
Spoke w/ pt grand daughter and requested that he send a manual transmission b/c his home monitor has not updated in at least 14 days.

## 2018-01-17 NOTE — Progress Notes (Signed)
SLP Cancellation Note  Patient Details Name: Thomas Hooper. MRN: [id number] DOB: 01-19-1925   Cancelled treatment:       Reason Eval/Treat Not Completed: Patient not medically ready. Orders received for BSE. RN reports no difficulty with breakfast or meds, however, pt had an episode of unresponsiveness earlier and is now awaiting multiple tests (CT, EEG, MRI) and transfer to neuro floor. RN requests hold on BSE at this time. Pt is currently NPO. SLP will continue efforts as pt availability and appropriateness allows.  Thomas Hooper, Thomas Hooper Speech Language Pathologist [phone]  Shonna Chock 01/17/2018, 3:24 PM

## 2018-01-17 NOTE — Progress Notes (Signed)
Patient transferred to #W, report given to [facility], RN. Family at bedside. Patient alert and orientedx3.

## 2018-01-17 NOTE — Clinical Social Work Note (Signed)
CSW updated SNF admissions coordinator on patient status.  Thomas Hooper, [place]

## 2018-01-17 NOTE — Consult Note (Signed)
Neurology Consultation  Reason for Consult: AMS, unresponsiveness Referring Physician: Dr. Eliseo Squires  CC: Sudden onset of becoming "less resposive" while straining  History is obtained from: RN, Chart  HPI: Thomas Hooper. is a 84 y.o. male's medical history of paroxysmal atrial fibrillation, chronic systolic heart failure EF in the 82s multiple falls, dementia, status post loop recorder insertion, currently admitted for chest pain evaluation.  Neurological consultation was obtained stat when the patient had sudden onset of less responsiveness while straining while having a bowel movement.  Patient was last evaluated by neurology in the month of July for described seizure activity at the hospital after hip reduction surgery.  He was doing well after his surgery and was noted to be fully conversant with the staff but next morning he suddenly yelled out and nurse came immediately to his room he was observed to be shaking his right upper extremity which was abducted at the shoulder per Dr. Yvetta Coder description and H&P, sharply flexed at the elbow sharply flex of the wrist and contorted position.  At that time his eyes were rolled up.  Seizure versus toxic metabolic encephalopathy was worked up. He was initially loaded with Depakote which was discontinued as he was resumed on his gabapentin which served as treatment for nerve pain as well as antiepileptic.  Current admission was for chest pain.  Found to have pleural effusion after fall.  Thoracentesis done with 1200 cc of fluid removed.  Cardiology consultation was obtained and fluid status was adjusted and recommendations for palliative care evaluation were made given his advanced cardiac disease and overall clinical picture.  LKW: Noon 01/17/2018 tpa given?: no, nonfocal exam Premorbid modified Rankin scale (mRS): 5  ROS:ROS was performed and is negative except as noted in the HPI.  Past Medical History:  Diagnosis Date  . Atrial fibrillation ([place])    . Chronic kidney disease    Stage 3  . Dementia   . Hypertension   . Neuropathy   . Thyroid disease   . TIA (transient ischemic attack)     Family History  Problem Relation Age of Onset  . Cancer Father   . CAD Brother    Social History:   reports that he has never smoked. He has never used smokeless tobacco. He reports that he does not drink alcohol or use drugs.  Medications  Current Facility-Administered Medications:  .  0.9 %  sodium chloride infusion, 250 mL, Intravenous, PRN, Jani Gravel, MD .  acetaminophen (TYLENOL) tablet 650 mg, 650 mg, Oral, Q6H PRN, 650 mg at 01/16/18 2302 **OR** acetaminophen (TYLENOL) suppository 650 mg, 650 mg, Rectal, Q6H PRN, Jani Gravel, MD .  allopurinol (ZYLOPRIM) tablet 100 mg, 100 mg, Oral, Daily, Vann, Jessica U, DO, 100 mg at 01/17/18 0957 .  atorvastatin (LIPITOR) tablet 10 mg, 10 mg, Oral, QPM, Vann, Jessica U, DO .  calcium-vitamin D 500-200 MG-UNIT per tablet 1 tablet, 1 tablet, Oral, QPM, Vann, Jessica U, DO .  docusate (COLACE) 50 MG/5ML liquid 100 mg, 100 mg, Oral, BID, Vann, Jessica U, DO .  enoxaparin (LOVENOX) injection 30 mg, 30 mg, Subcutaneous, Q24H, Jani Gravel, MD, 30 mg at 01/17/18 0955 .  ferrous sulfate tablet 325 mg, 325 mg, Oral, Daily, Eulogio Bear U, DO, 325 mg at 01/17/18 0957 .  furosemide (LASIX) tablet 20 mg, 20 mg, Oral, Daily, Vann, Jessica U, DO, 20 mg at 01/17/18 0957 .  [START ON 01/18/2018] levothyroxine (SYNTHROID, LEVOTHROID) tablet 50 mcg, 50 mcg, Oral, QAC breakfast, Vann,  Jessica U, DO .  lidocaine (XYLOCAINE) 2 % injection, , , PRN, Bruning, Kevin, PA-C, 10 mL at 01/16/18 1343 .  metoprolol tartrate (LOPRESSOR) tablet 12.5 mg, 12.5 mg, Oral, BID, Vann, Jessica U, DO, 12.5 mg at 01/17/18 0958 .  polyethylene glycol (MIRALAX / GLYCOLAX) packet 17 g, 17 g, Oral, Daily, Vann, Jessica U, DO .  sertraline (ZOLOFT) tablet 25 mg, 25 mg, Oral, QHS, Vann, Jessica U, DO .  sodium chloride flush (NS) 0.9 % injection 3  mL, 3 mL, Intravenous, Q12H, Jani Gravel, MD, 3 mL at 01/17/18 1001 .  sodium chloride flush (NS) 0.9 % injection 3 mL, 3 mL, Intravenous, PRN, Jani Gravel, MD .  vitamin B-12 (CYANOCOBALAMIN) tablet 1,000 mcg, 1,000 mcg, Oral, Daily, Eulogio Bear U, DO, 1,000 mcg at 01/17/18 0957  Exam: Current vital signs: BP (!) 159/114 (BP Location: Left Arm)   Pulse (!) 33   Temp 97.6 F (36.4 C) (Oral)   Resp 19   Ht 5\' 10"  (1.778 m)   Wt 69 kg   SpO2 100%   BMI 21.83 kg/m  Vital signs in last 24 hours: Temp:  [97.5 F (36.4 C)-98.6 F (37 C)] 97.6 F (36.4 C) (08/13 0449) Pulse Rate:  [33-89] 33 (08/13 1159) Resp:  [16-20] 19 (08/13 1159) BP: (114-159)/(72-114) 159/114 (08/13 1159) SpO2:  [96 %-100 %] 100 % (08/13 1159) Weight:  [69 kg] 69 kg (08/13 0449) Gen: WD WN NAD HEENT: [place] AT MMM CVS: S1S2+, irregularly irregular Resp: CTABL Ext: LLE in hip brace, pitting edema in both UE. NEUROLOGICAL Awake, alert, non verbal, in NAD Can not name, comprehend or repeat and is totally non verbal. He did track me and a family member when we were on the sides of his bed CN: PERRL, blinks to threat from both sides, no gaze deviation or preference, does track both sides, mild Right NLF flattening Motor: does not move any extremity to nox stim Sensory: mild grimace to nox stim but does not move to command or spontaneously Coord: did not perform Gait can not be tested due to mentation  Labs I have reviewed labs in epic and the results pertinent to this consultation are:  CBC    Component Value Date/Time   WBC 5.6 01/17/2018 0535   RBC 2.99 (L) 01/17/2018 0535   HGB 9.9 (L) 01/17/2018 0535   HCT 31.4 (L) 01/17/2018 0535   PLT 163 01/17/2018 0535   MCV 105.0 (H) 01/17/2018 0535   MCH 33.1 01/17/2018 0535   MCHC 31.5 01/17/2018 0535   RDW 14.8 01/17/2018 0535   LYMPHSABS 0.4 (L) 12/28/2017 0521   MONOABS 1.0 12/28/2017 0521   EOSABS 0.0 12/28/2017 0521   BASOSABS 0.0 12/28/2017 0521    CMP     Component Value Date/Time   NA 137 01/17/2018 0535   K 3.8 01/17/2018 0535   CL 102 01/17/2018 0535   CO2 26 01/17/2018 0535   GLUCOSE 91 01/17/2018 0535   BUN 27 (H) 01/17/2018 0535   CREATININE 1.61 (H) 01/17/2018 0535   CALCIUM 7.9 (L) 01/17/2018 0535   PROT 5.5 (L) 01/16/2018 0551   ALBUMIN 2.4 (L) 01/16/2018 0551   AST 21 01/16/2018 0551   ALT 21 01/16/2018 0551   ALKPHOS 123 01/16/2018 0551   BILITOT 0.6 01/16/2018 0551   GFRNONAA 36 (L) 01/17/2018 0535   GFRAA 41 (L) 01/17/2018 0535   Imaging I have reviewed the images obtained: CT-scan of the brain shows no acute changes.  Assessment:  84 year old man with past medical history of paroxysmal A. fib, chronic systolic heart failure with EF in the 40s, multiple falls, dementia, status post loop recorder insertion to document the A. fib load, became less responsive in the bed today presumably possibly attempting to have a bowel movement. On my examination, he opens his eyes and tracks but he is completely nonverbal and does not follow any commands. I could not elucidate any focal motor or sensory deficits. On more history taking with the family, the have said that when he gets more depressed or bull headed, he does not behave like his normal self but he does not have much in terms of behavioral disturbances. He does have a history of dementia which according to the family is very mild. Differential at this time include: -Vasovagal syncope -Orthostatic hypotension -New stroke -Seizure -Toxic metabolic encephalopathy -Last on the differential would be behavioral disturbance  Recommendations: Frequent neurochecks Orthostatic vitals MRI brain without contrast MRA head and neck without contrast He was on gabapentin as probable antiepileptic as well as pain medication.  His gabapentin dose on admission because of his deranged renal function was reduced to 300 at bedtime.  I would recommend increasing it to 300mg  -  3 times daily. He is not on any other antiepileptics.  I would hold off on any other medications until we get more information with tests and follow-up exams. Routine EEG  Plan discussed with Dr. Eliseo Squires on the unit.  We will follow with you.  -- Amie Portland, MD [facility] Pager: [phone] If 7pm to 7am, please call on call as listed on AMION.

## 2018-01-17 NOTE — Consult Note (Signed)
   [facility] CM Inpatient Consult   01/17/2018  Thomas Hooper 09/06/1924 [id number]  Patient recently active with [place] [employer] services with a [facility] Social worker. Patient with Medicare ACO and primary care provider is Dr. Vassie Loll Eyk confirmed. Patient in for observation related to chest pain and shortness of breath.  Met with granddaughter, Oneida Arenas listed as contact person and family at bedside.  Spoke to patient but he did not answer. Patient noted with Dementia.  They are in agreement for ongoing [facility] follow up at the skilled facility.  Current plan as discussed is to return to [facility] per inpatient social worker's notes.  For questions contact:   Natividad Brood, RN BSN [facility] Liaison  [phone] business mobile phone Toll free office [phone]

## 2018-01-18 ENCOUNTER — Observation Stay (HOSPITAL_COMMUNITY): Payer: Medicare Other

## 2018-01-18 DIAGNOSIS — I130 Hypertensive heart and chronic kidney disease with heart failure and stage 1 through stage 4 chronic kidney disease, or unspecified chronic kidney disease: Secondary | ICD-10-CM | POA: Diagnosis not present

## 2018-01-18 DIAGNOSIS — R4182 Altered mental status, unspecified: Secondary | ICD-10-CM | POA: Diagnosis not present

## 2018-01-18 DIAGNOSIS — F0150 Vascular dementia without behavioral disturbance: Secondary | ICD-10-CM

## 2018-01-18 DIAGNOSIS — S2249XD Multiple fractures of ribs, unspecified side, subsequent encounter for fracture with routine healing: Secondary | ICD-10-CM | POA: Diagnosis not present

## 2018-01-18 DIAGNOSIS — Z471 Aftercare following joint replacement surgery: Secondary | ICD-10-CM | POA: Diagnosis not present

## 2018-01-18 DIAGNOSIS — M1612 Unilateral primary osteoarthritis, left hip: Secondary | ICD-10-CM | POA: Diagnosis not present

## 2018-01-18 DIAGNOSIS — R2681 Unsteadiness on feet: Secondary | ICD-10-CM | POA: Diagnosis not present

## 2018-01-18 DIAGNOSIS — Z9181 History of falling: Secondary | ICD-10-CM | POA: Diagnosis not present

## 2018-01-18 DIAGNOSIS — R278 Other lack of coordination: Secondary | ICD-10-CM | POA: Diagnosis not present

## 2018-01-18 DIAGNOSIS — Z79899 Other long term (current) drug therapy: Secondary | ICD-10-CM | POA: Diagnosis not present

## 2018-01-18 DIAGNOSIS — F0280 Dementia in other diseases classified elsewhere without behavioral disturbance: Secondary | ICD-10-CM

## 2018-01-18 DIAGNOSIS — Z4789 Encounter for other orthopedic aftercare: Secondary | ICD-10-CM | POA: Diagnosis not present

## 2018-01-18 DIAGNOSIS — J90 Pleural effusion, not elsewhere classified: Secondary | ICD-10-CM | POA: Diagnosis not present

## 2018-01-18 DIAGNOSIS — R1312 Dysphagia, oropharyngeal phase: Secondary | ICD-10-CM | POA: Diagnosis not present

## 2018-01-18 DIAGNOSIS — F0390 Unspecified dementia without behavioral disturbance: Secondary | ICD-10-CM | POA: Diagnosis not present

## 2018-01-18 DIAGNOSIS — G4089 Other seizures: Secondary | ICD-10-CM | POA: Diagnosis not present

## 2018-01-18 DIAGNOSIS — R0789 Other chest pain: Secondary | ICD-10-CM | POA: Diagnosis not present

## 2018-01-18 DIAGNOSIS — Z96643 Presence of artificial hip joint, bilateral: Secondary | ICD-10-CM | POA: Diagnosis not present

## 2018-01-18 DIAGNOSIS — N183 Chronic kidney disease, stage 3 (moderate): Secondary | ICD-10-CM | POA: Diagnosis not present

## 2018-01-18 DIAGNOSIS — I214 Non-ST elevation (NSTEMI) myocardial infarction: Secondary | ICD-10-CM | POA: Diagnosis not present

## 2018-01-18 DIAGNOSIS — I5022 Chronic systolic (congestive) heart failure: Secondary | ICD-10-CM | POA: Diagnosis not present

## 2018-01-18 DIAGNOSIS — R41841 Cognitive communication deficit: Secondary | ICD-10-CM | POA: Diagnosis not present

## 2018-01-18 DIAGNOSIS — Z87898 Personal history of other specified conditions: Secondary | ICD-10-CM | POA: Diagnosis not present

## 2018-01-18 DIAGNOSIS — Z7401 Bed confinement status: Secondary | ICD-10-CM | POA: Diagnosis not present

## 2018-01-18 DIAGNOSIS — R2689 Other abnormalities of gait and mobility: Secondary | ICD-10-CM | POA: Diagnosis not present

## 2018-01-18 DIAGNOSIS — R079 Chest pain, unspecified: Secondary | ICD-10-CM | POA: Diagnosis not present

## 2018-01-18 DIAGNOSIS — R55 Syncope and collapse: Secondary | ICD-10-CM

## 2018-01-18 DIAGNOSIS — M2550 Pain in unspecified joint: Secondary | ICD-10-CM | POA: Diagnosis not present

## 2018-01-18 DIAGNOSIS — R5381 Other malaise: Secondary | ICD-10-CM | POA: Diagnosis not present

## 2018-01-18 DIAGNOSIS — M6281 Muscle weakness (generalized): Secondary | ICD-10-CM | POA: Diagnosis not present

## 2018-01-18 DIAGNOSIS — R4189 Other symptoms and signs involving cognitive functions and awareness: Secondary | ICD-10-CM | POA: Diagnosis not present

## 2018-01-18 DIAGNOSIS — Z515 Encounter for palliative care: Secondary | ICD-10-CM | POA: Diagnosis not present

## 2018-01-18 DIAGNOSIS — I5020 Unspecified systolic (congestive) heart failure: Secondary | ICD-10-CM | POA: Diagnosis not present

## 2018-01-18 DIAGNOSIS — I480 Paroxysmal atrial fibrillation: Secondary | ICD-10-CM | POA: Diagnosis not present

## 2018-01-18 DIAGNOSIS — Z96649 Presence of unspecified artificial hip joint: Secondary | ICD-10-CM | POA: Diagnosis not present

## 2018-01-18 LAB — CBC
HCT: 34.7 % — ABNORMAL LOW (ref 39.0–52.0)
Hemoglobin: 11.1 g/dL — ABNORMAL LOW (ref 13.0–17.0)
MCH: 33.2 pg (ref 26.0–34.0)
MCHC: 32 g/dL (ref 30.0–36.0)
MCV: 103.9 fL — AB (ref 78.0–100.0)
Platelets: 157 10*3/uL (ref 150–400)
RBC: 3.34 MIL/uL — ABNORMAL LOW (ref 4.22–5.81)
RDW: 14.3 % (ref 11.5–15.5)
WBC: 5.3 10*3/uL (ref 4.0–10.5)

## 2018-01-18 LAB — BASIC METABOLIC PANEL
ANION GAP: 10 (ref 5–15)
BUN: 26 mg/dL — AB (ref 8–23)
CO2: 27 mmol/L (ref 22–32)
CREATININE: 1.57 mg/dL — AB (ref 0.61–1.24)
Calcium: 8.3 mg/dL — ABNORMAL LOW (ref 8.9–10.3)
Chloride: 100 mmol/L (ref 98–111)
GFR calc Af Amer: 43 mL/min — ABNORMAL LOW (ref >60)
GFR, EST NON AFRICAN AMERICAN: 37 mL/min — AB (ref >60)
GLUCOSE: 97 mg/dL (ref 70–99)
Potassium: 3.9 mmol/L (ref 3.5–5.1)
Sodium: 137 mmol/L (ref 135–145)

## 2018-01-18 MED ORDER — METOPROLOL TARTRATE 25 MG PO TABS: 12.5000 mg | ORAL_TABLET | Freq: Two times a day (BID) | ORAL | Status: AC

## 2018-01-18 MED ORDER — GABAPENTIN 600 MG PO TABS: 300.0000 mg | ORAL_TABLET | Freq: Three times a day (TID) | ORAL | Status: AC

## 2018-01-18 MED ORDER — METOPROLOL TARTRATE 12.5 MG HALF TABLET: 12.5000 mg | ORAL_TABLET | Freq: Two times a day (BID) | ORAL | Status: DC

## 2018-01-18 MED ORDER — POLYETHYLENE GLYCOL 3350 17 G PO PACK: 17.0000 g | PACK | Freq: Every day | ORAL | 0 refills | Status: AC

## 2018-01-18 NOTE — Progress Notes (Signed)
EEG reported normal. No new recommendations. Please call neurology with questions as needed.

## 2018-01-18 NOTE — Procedures (Signed)
ELECTROENCEPHALOGRAM REPORT   Patient: Thomas Hooper.       Room #: 3W27C EEG No. ID: [id number] Age: 84 y.o.        Sex: male Referring Physician: Eliseo Squires Report Date:  01/18/2018        Interpreting Physician: Alexis Goodell  History: Thomas Hooper. is an 84 y.o. male with a syncopal episode evaluated to rule out seizure  Medications:  Allopurinol, Lipitor, Calcium-Vitamin D, Colace, Ferrous Sulfate, Lasix, Neurontin, Synthroid, Lopressor, Miralax, Zoloft, B12,   Conditions of Recording:  This is a 21 channel routine scalp EEG performed with bipolar and monopolar montages arranged in accordance to the international 10/20 system of electrode placement. One channel was dedicated to EKG recording.  The patient is in the awake, drowsy and asleep states.  Description:  The patient is awakened at the end of the recording.  During that time a waking background activity can be seen that consists of a low voltage, symmetrical, fairly well organized, 8 Hz alpha activity at its maximum, seen from the parieto-occipital and posterior temporal regions.  Low voltage fast activity, poorly organized, is seen anteriorly and is at times superimposed on more posterior regions.  A mixture of theta and alpha rhythms are seen from the central and temporal regions. The patient drowses for the majority of the recording with a background noted that is slow and irregular with low voltage theta and beta activity.   The patient goes in to a light sleep with symmetrical sleep spindles, vertex central sharp transients and irregular slow activity.  No epileptiform activity is noted.   Hyperventilation and intermittent photic stimulation were not performed.   IMPRESSION: Normal electroencephalogram, awake, asleep and with activation procedures. There are no focal lateralizing or epileptiform features.   Alexis Goodell, MD Neurology [phone] 01/18/2018, 1:45 PM

## 2018-01-18 NOTE — Progress Notes (Signed)
Discharge to: [facility] Anticipated discharge date: 01/18/18 Family notified: Margaretmary Bayley, by phone Transportation by: [facility]  Report #: [phone], Room 111  CSW signing off.  Laveda Abbe LCSW [phone]

## 2018-01-18 NOTE — Progress Notes (Signed)
Neurology Progress Note   S:// More awake. No sz or LOC overnight   O:// Current vital signs: BP (!) 148/76 (BP Location: Right Arm)   Pulse (!) 47   Temp (!) 97.5 F (36.4 C) (Oral)   Resp 18   Ht 5\' 10"  (1.778 m)   Wt 69 kg   SpO2 100%   BMI 21.83 kg/m  Vital signs in last 24 hours: Temp:  [97.5 F (36.4 C)-98.6 F (37 C)] 97.5 F (36.4 C) (08/14 0834) Pulse Rate:  [33-119] 47 (08/14 0834) Resp:  [16-19] 18 (08/14 0834) BP: (133-159)/(70-114) 148/76 (08/14 0834) SpO2:  [97 %-100 %] 100 % (08/14 0834) GENERAL: Awake, alert in NAD HEENT: - Normocephalic and atraumatic, dry mm, no LN++, no Thyromegally LUNGS - Clear to auscultation bilaterally with no wheezes CV - S1S2 RRR, no m/r/g, equal pulses bilaterally. ABDOMEN - Soft, nontender, nondistended with normoactive BS Ext: warm, well perfused, intact peripheral pulses, edema in all 4s. Brace on left NEURO:  Mental Status: Awake, alert, knows Zac[facility] Pontes but not date/time Language: speech is clear.  Poor attention and concentration, able to name intermittently. Speech is fluent. Cranial Nerves: PERRL  EOMI, visual fields full, no facial asymmetry, facial sensation intact, hearing grossly reduced, tongue/uvula/soft palate midline, normal sternocleidomastoid and trapezius muscle strength. No evidence of tongue atrophy or fibrillations Motor:LUE restricted by pain and baseline weakness, RUE 5/5, RLE  Antigravity. LLE in brace Sensation- Intact to light touch bilaterally Gait- deferred  Medications  Current Facility-Administered Medications:  .  0.9 %  sodium chloride infusion, 250 mL, Intravenous, PRN, Jani Gravel, MD .  acetaminophen (TYLENOL) tablet 650 mg, 650 mg, Oral, Q6H PRN, 650 mg at 01/16/18 2302 **OR** acetaminophen (TYLENOL) suppository 650 mg, 650 mg, Rectal, Q6H PRN, Jani Gravel, MD .  allopurinol (ZYLOPRIM) tablet 100 mg, 100 mg, Oral, Daily, Vann, Jessica U, DO, 100 mg at 01/17/18 0957 .  atorvastatin (LIPITOR)  tablet 10 mg, 10 mg, Oral, QPM, Vann, Jessica U, DO, 10 mg at 01/17/18 1726 .  bisacodyl (DULCOLAX) suppository 10 mg, 10 mg, Rectal, Daily PRN, Vann, Jessica U, DO .  calcium-vitamin D (OSCAL WITH D) 500-200 MG-UNIT per tablet 1 tablet, 1 tablet, Oral, QPM, Vann, Jessica U, DO, 1 tablet at 01/17/18 1726 .  docusate (COLACE) 50 MG/5ML liquid 100 mg, 100 mg, Oral, BID, Vann, Jessica U, DO, 100 mg at 01/17/18 2129 .  enoxaparin (LOVENOX) injection 30 mg, 30 mg, Subcutaneous, Q24H, Jani Gravel, MD, 30 mg at 01/17/18 0955 .  ferrous sulfate tablet 325 mg, 325 mg, Oral, Daily, Eulogio Bear U, DO, 325 mg at 01/17/18 0957 .  furosemide (LASIX) tablet 20 mg, 20 mg, Oral, Daily, Vann, Jessica U, DO, 20 mg at 01/17/18 0957 .  gabapentin (NEURONTIN) tablet 300 mg, 300 mg, Oral, TID, Vann, Jessica U, DO, 300 mg at 01/17/18 2128 .  levothyroxine (SYNTHROID, LEVOTHROID) tablet 50 mcg, 50 mcg, Oral, QAC breakfast, Vann, Jessica U, DO, 50 mcg at 01/18/18 0540 .  lidocaine (XYLOCAINE) 2 % injection, , , PRN, Bruning, Kevin, PA-C, 10 mL at 01/16/18 1343 .  metoprolol tartrate (LOPRESSOR) tablet 25 mg, 25 mg, Oral, BID, Vann, Jessica U, DO, 25 mg at 01/17/18 2128 .  polyethylene glycol (MIRALAX / GLYCOLAX) packet 17 g, 17 g, Oral, Daily, Vann, Jessica U, DO .  sertraline (ZOLOFT) tablet 25 mg, 25 mg, Oral, QHS, Vann, Jessica U, DO, 25 mg at 01/17/18 2129 .  sodium chloride flush (NS) 0.9 % injection 3  mL, 3 mL, Intravenous, Q12H, Jani Gravel, MD, 3 mL at 01/17/18 2148 .  sodium chloride flush (NS) 0.9 % injection 3 mL, 3 mL, Intravenous, PRN, Jani Gravel, MD .  vitamin B-12 (CYANOCOBALAMIN) tablet 1,000 mcg, 1,000 mcg, Oral, Daily, Eulogio Bear U, DO, 1,000 mcg at 01/17/18 0957 Labs CBC    Component Value Date/Time   WBC 5.3 01/18/2018 0605   RBC 3.34 (L) 01/18/2018 0605   HGB 11.1 (L) 01/18/2018 0605   HCT 34.7 (L) 01/18/2018 0605   PLT 157 01/18/2018 0605   MCV 103.9 (H) 01/18/2018 0605   MCH 33.2 01/18/2018  0605   MCHC 32.0 01/18/2018 0605   RDW 14.3 01/18/2018 0605   LYMPHSABS 0.4 (L) 12/28/2017 0521   MONOABS 1.0 12/28/2017 0521   EOSABS 0.0 12/28/2017 0521   BASOSABS 0.0 12/28/2017 0521    CMP     Component Value Date/Time   NA 137 01/18/2018 0605   K 3.9 01/18/2018 0605   CL 100 01/18/2018 0605   CO2 27 01/18/2018 0605   GLUCOSE 97 01/18/2018 0605   BUN 26 (H) 01/18/2018 0605   CREATININE 1.57 (H) 01/18/2018 0605   CALCIUM 8.3 (L) 01/18/2018 0605   PROT 5.5 (L) 01/16/2018 0551   ALBUMIN 2.4 (L) 01/16/2018 0551   AST 21 01/16/2018 0551   ALT 21 01/16/2018 0551   ALKPHOS 123 01/16/2018 0551   BILITOT 0.6 01/16/2018 0551   GFRNONAA 37 (L) 01/18/2018 0605   GFRAA 43 (L) 01/18/2018 5400    Imaging I have reviewed images in epic and the results pertinent to this consultation are: MRI examination of the brain-no acute changes, chronic WM changes, age related atrophy.  Assessment: 84 year old man, with past medical history of paroxysmal atrial fibrillation, chronic systolic heart failure with ejection fraction in the 40s, dementia, multiple falls, status post loop recorder for fibrillation load documentation, status post left hip surgery, status post thoracentesis, became unresponsive yesterday around the time of a bowel movement. He has had episodes in the past that have been concerning for seizures for which she was started on antiepileptics other than his Neurontin but finally changed all his antiepileptics to only Neurontin. His Neurontin dose was reduced during this hospitalization, and has been increased to 300 3 times daily. Today his exam is much better and he seems to be at his baseline. I suspect that he has a multitude of factors contributing to his current clinical picture and the episode of decreased level of consciousness and include: - Dementia -Toxic metabolic encephalopathy - Vasovagal syncope as the episode happened around the time of bowel movement - Atrial  fibrillation -Bradycardia - Heart failure  Brain imaging does not reveal any acute stroke.  He has had an EEG in the past that has been suggestive of generalized slowing and no epileptogenic city. Today, EEG has been ordered and is pending.  I do not anticipate to find any epileptogenic abnormalities.  Recommendations: Continue with current doses of Neurontin Correction of toxic metabolic derangements per primary team Await EEG.  Will consider additional antiepileptics or increased dose of Neurontin if the EEG shows any abnormalities. Check urinalysis to ensure he does not have any underlying infection contributing to his current clinical picture Palliative consultation and family talks underway regarding goals of care and CODE STATUS.  Neurology will be available as needed.  -- Amie Portland, MD [facility] Pager: [phone] If 7pm to 7am, please call on call as listed on AMION.

## 2018-01-18 NOTE — Progress Notes (Signed)
Daily Progress Note   Patient Name: Thomas Hooper.       Date: 01/18/2018 DOB: 06/08/1924  Age: 84 y.o. MRN#: [id number] Attending Physician: Geradine Girt, DO Primary Care Physician: Townsend Roger, MD Admit Date: 01/15/2018  Reason for Consultation/Follow-up: Establishing goals of care and Psychosocial/spiritual support  Subjective: Patient is awake and comfortable.  He tells me he wants to get out of the hospital.    I spoke with Dr. Eliseo Squires and then [name] (grand daughter) on the phone.  Discussed results of MRI and EEG.  Explain remote cerebellar infarcts and chronic small vessel disease on MRI.  These tests did not show a source for his unresponsive episode yesterday.   We discussed his variable heart rate (120s -> 30s) and how that could certainly cause unresponsive episodes.    Brought up MOST form and code status once again.  [name] would like to speak with the patient's daughter before making these decisions.   I advised that Palliative is available at most SNFs and they could assist her.  [name] asked questions about the patient's brace and what he would be allowed to do from an Ortho perspective at SNF. She is comfortable with him being discharged to [facility] today.   Assessment: Patient comfortable.  Requesting discharge.     Patient Profile/HPI:  84 y.o. male  with past medical history of dementia, afib, heart failure (EF 40%), CKD3, thyroid disease, TIA, multiple falls, seizure activity, and a recent admission for a dislocated hip prosthesis who was admitted on 01/15/2018 with shortness of breath.  He underwent thoracentesis and was much improved.  On the day of discharge he had an unresponsive episode.  MRI and EEG are pending.   Length of Stay: 0  Current  Medications: Scheduled Meds:  . allopurinol  100 mg Oral Daily  . atorvastatin  10 mg Oral QPM  . calcium-vitamin D  1 tablet Oral QPM  . docusate  100 mg Oral BID  . enoxaparin (LOVENOX) injection  30 mg Subcutaneous Q24H  . ferrous sulfate  325 mg Oral Daily  . furosemide  20 mg Oral Daily  . gabapentin  300 mg Oral TID  . levothyroxine  50 mcg Oral QAC breakfast  . metoprolol tartrate  12.5 mg Oral BID  . polyethylene glycol  17 g  Oral Daily  . sertraline  25 mg Oral QHS  . sodium chloride flush  3 mL Intravenous Q12H  . vitamin B-12  1,000 mcg Oral Daily    Continuous Infusions: . sodium chloride      PRN Meds: sodium chloride, acetaminophen **OR** acetaminophen, bisacodyl, lidocaine, sodium chloride flush  Physical Exam        Well developed awake, alert, NAD, pleasantly demented. REsp no distress cv bradycardic.  Vital Signs: BP (!) 148/76 (BP Location: Right Arm)   Pulse (!) 54   Temp 97.6 F (36.4 C) (Oral)   Resp 20   Ht 5\' 10"  (1.778 m)   Wt 69 kg   SpO2 94%   BMI 21.83 kg/m  SpO2: SpO2: 94 % O2 Device: O2 Device: Room Air O2 Flow Rate:    Intake/output summary:   Intake/Output Summary (Last 24 hours) at 01/18/2018 1625 Last data filed at 01/18/2018 1230 Gross per 24 hour  Intake 960 ml  Output 750 ml  Net 210 ml   LBM: Last BM Date: 01/18/18 Baseline Weight: Weight: 71.1 kg Most recent weight: Weight: 69 kg       Palliative Assessment/Data: 30%    Flowsheet Rows     Most Recent Value  Intake Tab  Referral Department  Hospitalist  Unit at Time of Referral  Cardiac/Telemetry Unit  Palliative Care Primary Diagnosis  Neurology  Date Notified  01/17/18  Palliative Care Type  Return patient Palliative Care  Reason for referral  Clarify Goals of Care  Date of Admission  01/15/18  Date first seen by Palliative Care  01/17/18  # of days Palliative referral response time  0 Day(s)  # of days IP prior to Palliative referral  2  Clinical  Assessment  Psychosocial & Spiritual Assessment  Palliative Care Outcomes      Patient Active Problem List   Diagnosis Date Noted  . Pressure injury of skin 01/17/2018  . Pleural effusion   . Palliative care encounter   . Goals of care, counseling/discussion   . Chest pain 01/15/2018  . Anemia 01/15/2018  . Rib fractures 12/25/2017  . Seizure ([place])   . Status post closed reduction of dislocated total hip prosthesis 12/23/2017  . Degeneration of intervertebral disc of cervical region 10/14/2017  . Neck pain 10/14/2017  . Peripheral neuropathy 10/14/2017  . Spondylosis of cervical region without myelopathy or radiculopathy 10/14/2017  . Chronic anticoagulation 02/28/2017  . Dementia 02/28/2017  . Cardiomyopathy ([place]) 02/28/2017  . TIA (transient ischemic attack) 02/28/2017  . CKD (chronic kidney disease) stage 3, GFR 30-59 ml/min (HCC) 02/24/2017  . Gout 02/24/2017  . Hypertensive heart and kidney disease 05/19/2016  . Hypothyroidism (acquired) 05/19/2016  . Paroxysmal atrial fibrillation ([place]) 05/19/2016  . Essential hypertension 05/19/2016    Palliative Care Plan    Recommendations/Plan:  D/C to SNF with Palliative to follow.   Family will need to make decisions regarding [place].  Goals of Care and Additional Recommendations:  Limitations on Scope of Treatment: Full Scope Treatment  Code Status:  Full code  Prognosis:   Less than 12 months given multiple falls.  No longer ambulatory.  Advancing dementia.  Systolic heart failure.  Seizure activity.  Discharge Planning:  [facility] for rehab with Palliative care service follow-up  Care plan was discussed with Dr. Eliseo Squires and [name]  Thank you for allowing the Palliative Medicine Team to assist in the care of this patient.  Total time spent:  25 min.  Greater than 50%  of this time was spent counseling and coordinating care related to the above assessment and plan.  Florentina Jenny,  PA-C Palliative Medicine  Please contact Palliative MedicineTeam phone at [phone] for questions and concerns between 7 am - 7 pm.   Please see AMION for individual provider pager numbers.

## 2018-01-18 NOTE — Progress Notes (Signed)
Report called to [facility] staff. Pt has no belonging. Denture is in his mouth, confirmed with [facility] staff. Family aware of disposition.  Ave Filter, RN

## 2018-01-18 NOTE — Evaluation (Addendum)
Clinical/Bedside Swallow Evaluation Patient Details  Name: Thomas Hooper. MRN: [id number] Date of Birth: [birth year]  Today's Date: 01/18/2018 Time: SLP Start Time (ACUTE ONLY): 4174 SLP Stop Time (ACUTE ONLY): 0947 SLP Time Calculation (min) (ACUTE ONLY): 23 min  Past Medical History:  Past Medical History:  Diagnosis Date  . Atrial fibrillation ([place])   . Chronic kidney disease    Stage 3  . Dementia   . Hypertension   . Neuropathy   . Thyroid disease   . TIA (transient ischemic attack)    Past Surgical History:  Past Surgical History:  Procedure Laterality Date  . BACK SURGERY    . CHOLECYSTECTOMY    . HIP CLOSED REDUCTION Left 12/23/2017   Procedure: CLOSED REDUCTION HIP;  Surgeon: Rod Can, MD;  Location: [place];  Service: Orthopedics;  Laterality: Left;  . HIP SURGERY    . IR THORACENTESIS ASP PLEURAL SPACE W/IMG GUIDE  01/16/2018  . KNEE SURGERY    . LOOP RECORDER INSERTION N/A 05/10/2017   Procedure: LOOP RECORDER INSERTION;  Surgeon: Constance Haw, MD;  Location: [place] [facility];  Service: Cardiovascular;  Laterality: N/A;  . SHOULDER SURGERY     HPI:  84 year old male admitted 01/15/18 with chest pain and headache. PMH: multiple falls, dementia, TIA, CKD3, PAFib. CXR 01/16/18 indicated Less pleural fluid on the right.  No pneumothorax; prior ST evaluation 12/29/17 indicated need for cup sips of thin/Dysphagia 3 (chopped) d/t pt's decreased mentation/dysphagia symptoms; MRI head negative on 01/17/18.  Assessment / Plan / Recommendation Clinical Impression   Pt presents with mild oropharyngeal dysphagia characterized by intermittent oral holding/delayed swallow initiation and decreased mastication with soft solids; pt has limited dentition and this impairs his ability to masticate solids efficiently paired with reduced cognition, places him at a mild risk for aspiration during consumption without swallowing precautions in place.   These include:  small sips, limit  distractions and consume with a slow rate.  One incidence of delayed throat clearing after entire intake noted (pt attempting to talk during this occurrence; redirection effective to eliminate this incidence); recommend downgrading to Dysphagia 3 (mechanical soft)/thin liquids with ST f/u x1 for diet tolerance/educating pt/family re: safe swallowing precautions/aspiration precautions during intake; medications whole with puree as pt stated "crushing pills burns my throat." SLP Visit Diagnosis: Dysphagia, oropharyngeal phase (R13.12)    Aspiration Risk  Mild aspiration risk    Diet Recommendation   Dysphagia 3(chopped)/thin liquids (small sips/straws allowed)  Medication Administration: Whole meds with puree    Other  Recommendations Oral Care Recommendations: Oral care BID   Follow up Recommendations 24 hour supervision/assistance      Frequency and Duration min 1 x/week  1 week       Prognosis Prognosis for Safe Diet Advancement: Fair Barriers to Reach Goals: Cognitive deficits      Swallow Study   General Date of Onset: 01/15/18 HPI: 84 year old male admitted 01/15/18 with chest pain and headache. PMH: multiple falls, dementia, TIA, CKD3, PAFib. Type of Study: Bedside Swallow Evaluation Previous Swallow Assessment: BSE 12/29/17 - rec Dys3/thin Diet Prior to this Study: Regular;Thin liquids;Other (Comment)(Heart healthy) Temperature Spikes Noted: No Respiratory Status: Room air History of Recent Intubation: No Behavior/Cognition: Alert;Cooperative;Pleasant mood Oral Cavity Assessment: Within Functional Limits Oral Care Completed by SLP: Other (Comment)(No; pt consuming breakfast tray when SLP entered room) Oral Cavity - Dentition: Dentures, top;Adequate natural dentition Vision: Functional for self-feeding Self-Feeding Abilities: Able to feed self;Needs set up;Needs assist  Patient Positioning: Upright in chair Baseline Vocal Quality: Low vocal intensity Volitional Cough:  Cognitively unable to elicit Volitional Swallow: Unable to elicit    Oral/Motor/Sensory Function Overall Oral Motor/Sensory Function: Within functional limits   Ice Chips Ice chips: Not tested   Thin Liquid Thin Liquid: Impaired Presentation: Straw;Cup Oral Phase Functional Implications: Oral holding Pharyngeal  Phase Impairments: Suspected delayed Swallow    Nectar Thick Nectar Thick Liquid: Not tested   Honey Thick Honey Thick Liquid: Not tested   Puree Puree: Not tested   Solid     Solid: Impaired Presentation: Self Fed Oral Phase Impairments: Impaired mastication;Reduced lingual movement/coordination Oral Phase Functional Implications: Prolonged oral transit;Oral residue Pharyngeal Phase Impairments: Suspected delayed Swallow;Throat Clearing - Delayed;Other (comments)(x1 after entire intake; pt talking while swallowing/decreased sustained attention/improved with redirection)      Elvina Sidle, M.S., CCC-SLP 01/18/2018,11:19 AM

## 2018-01-18 NOTE — Progress Notes (Signed)
EEG complete - results pending 

## 2018-01-18 NOTE — Discharge Summary (Signed)
Physician Discharge Summary  Thomas Hooper. XFG:[id number] DOB: 05-23-1925 DOA: 01/15/2018  PCP: Townsend Roger, MD  Admit date: 01/15/2018 Discharge date: 01/18/2018  Admitted From: SNF Discharge disposition: SNF   Recommendations for Outpatient Follow-Up:   1. Weight bearing as tolerated as long as wearing hip brace-- can only be off for toileting 2. Needs orthopedic follow up-- will need brace for at least 2 weeks 3. DYS 3 diet 4. PRN x ray to evaluate for pleural effusion (? Traumatic) 5. BMP 1 week 6. Lasix dose adjusted to 20 mg daily plus and extra 20 mg PRN 7. Resume palliative care talks with patient and family 8. Incentive spirometry 9. PRN O2   Discharge Diagnosis:   Principal Problem:   Chest pain Active Problems:   Paroxysmal atrial fibrillation (HCC)   CKD (chronic kidney disease) stage 3, GFR 30-59 ml/min (HCC)   Dementia   Anemia   Pressure injury of skin   Pleural effusion   Palliative care encounter   Goals of care, counseling/discussion    Discharge Condition: Improved.     History of Present Illness:   Thomas Hooper a89 y.o.male,w dementia, tia, ckd stage 3, Pafib, apparently c/o chest painand sob while at [facility]. Pt has dementia so its difficult to tell if chest pain truly present or not. Pt at current time denies chest pain.   In Ed,   ekg afib at 80, nl axis, slight st depression v5,6  CXR IMPRESSION: Right-sided effusion with underlying atelectatic changes.  Na 136, K 3.9, Bun37 creatinine 1.81  Wbc 8.2, Hgb 10.9, Plt 167 Trop 0.12  Pt will be admitted for chest pain and troponin elevation     Hospital Course by Problem:   Pleural effusion right(was originally thought to be from HF as he gained 10 lbs from 7/21-7/24 but based on lights criteria could be traumatic from CPR) -s/p thoracentesis- 1.1L removed -exudative based on labs- 1/2 criteria met -lasix scheduled and PRN-- monitor labs  closely  Unresponsive episode -?vasovagal from straining to have BM-- bowel regimen -EEG normal -MRI w/o new CVA -back to baseline  Recent hip dislocation -see directions above  Chest pain -recent echo in 7/19 -troponin flat -cardiology consult appreciated- no further intervention  Pafib Cont metoprolol -- did not tolerate a higher dose Cont Aspirin No NOAC/coumadin due to fall risk  Hypertension Use metoprolol  Hyperlipidemia Cont Lipitor 55m po qhs  Gout Cont allopurinol 1077mpo qday  Hypothyroidism Cont levothyroxine  Anxiety Cont Zoloft 2512mo qday  Anemia PRN CBC  CKD stage 3 -monitor with BMPs  Seizures -neurontin PO  Deep tissues injury on heel  Dementia -appears to be at baseline     Medical Consultants:    Neuro cards  Discharge Exam:   Vitals:   01/18/18 0742 01/18/18 0834  BP: (!) 147/77 (!) 148/76  Pulse: (!) 43 (!) 47  Resp: 19 18  Temp: 97.6 F (36.4 C) (!) 97.5 F (36.4 C)  SpO2: 98% 100%   Vitals:   01/18/18 0131 01/18/18 0350 01/18/18 0742 01/18/18 0834  BP: 134/70 134/72 (!) 147/77 (!) 148/76  Pulse: 70 79 (!) 43 (!) 47  Resp: _0 Temp: 97.6 F (36.4 C) (!) 97.5 F (36.4 C) 97.6 F (36.4 C) (!) 97.5 F (36.4 C)  TempSrc: Oral Oral Oral Oral  SpO2: 97% 100% 98% 100%  Weight:      Height:        General  exam: Appears calm and comfortable.    The results of significant diagnostics from this hospitalization (including imaging, microbiology, ancillary and laboratory) are listed below for reference.     Procedures and Diagnostic Studies:   Dg Chest 1 View  Result Date: 01/16/2018 CLINICAL DATA:  Followup right thoracentesis. EXAM: CHEST  1 VIEW COMPARISON:  01/15/2018 FINDINGS: Less pleural fluid on the right. No pleural air. Better aeration of the right lung. Mild persistent atelectasis at the left base. Loop recorder as seen previously. Cardiomegaly and aortic atherosclerosis.  IMPRESSION: Less pleural fluid on the right.  No pneumothorax. Electronically Signed   By: Nelson Chimes M.D.   On: 01/16/2018 14:11   Dg Chest 2 View  Result Date: 01/15/2018 CLINICAL DATA:  Central chest pain EXAM: CHEST - 2 VIEW COMPARISON:  12/25/2017 FINDINGS: Cardiac shadow is enlarged in size and stable. Loop recorder is again noted. Right-sided pleural effusion is seen with likely underlying atelectatic changes. No other focal infiltrate is seen. IMPRESSION: Right-sided effusion with underlying atelectatic changes. Electronically Signed   By: Inez Catalina M.D.   On: 01/15/2018 21:24   Ct Chest Wo Contrast  Result Date: 01/15/2018 CLINICAL DATA:  Chest pain and shortness of breath. EXAM: CT CHEST WITHOUT CONTRAST TECHNIQUE: Multidetector CT imaging of the chest was performed following the standard protocol without IV contrast. COMPARISON:  None. FINDINGS: Cardiovascular: Moderate cardiac enlargement. There is aortic atherosclerosis. No pericardial effusion. Calcifications within the LAD, left circumflex and RCA coronary arteries noted. Mediastinum/Nodes: The trachea appears patent and is midline. Moderate to large hiatal hernia noted. Normal appearance of the thyroid gland. No enlarged mediastinal or axillary adenopathy. Lungs/Pleura: There is a moderate to large right pleural effusion. Overlying passive atelectasis within the posterior right lower lobe identified. Small left pleural effusion is also identified. Passive atelectasis overlying the hiatal hernia noted in the left lower lobe. Patchy area of ground-glass attenuation within the lingula is identified compatible with nonspecific pneumonitis. Upper Abdomen: Calcified granulomas identified within the spleen. Previous cholecystectomy. Extensive atherosclerotic disease involves the abdominal aorta and branch vessels. Musculoskeletal: Spondylosis noted within the thoracic spine. Previous right shoulder arthroplasty. Chronic appearing lateral right  third rib fracture deformity noted. Age indeterminate fracture deformity involving the proximal body of sternum is identified, image 61/7. This may be related to recent CPR. Clinical correlation advised. Chronic L1 superior endplate compression fracture. IMPRESSION: 1. Moderate to large right pleural effusion with overlying passive atelectasis. 2. Small left pleural effusion. 3. Age indeterminate fracture deformity involving the proximal body of sternum. This may be related to recent CPR. Clinical correlation advised. 4. Chronic right third rib fracture and L1 superior endplate compression fracture. 5. Multi vessel coronary artery atherosclerotic calcifications. 6. Hiatal hernia. Aortic Atherosclerosis (ICD10-I70.0). Electronically Signed   By: Kerby Moors M.D.   On: 01/15/2018 23:39   Ir Thoracentesis Asp Pleural Space W/img Guide  Result Date: 01/16/2018 INDICATION: Shortness of breath. Right-sided pleural effusion. Request for diagnostic and therapeutic thoracentesis. EXAM: ULTRASOUND GUIDED RIGHT THORACENTESIS MEDICATIONS: None. COMPLICATIONS: None immediate. PROCEDURE: An ultrasound guided thoracentesis was thoroughly discussed with the patient and questions answered. The benefits, risks, alternatives and complications were also discussed. The patient understands and wishes to proceed with the procedure. Written consent was obtained. Ultrasound was performed to localize and mark an adequate pocket of fluid in the right chest. The area was then prepped and draped in the normal sterile fashion. 1% Lidocaine was used for local anesthesia. Under ultrasound guidance a 6 Fr Safe-T-Centesis catheter  was introduced. Thoracentesis was performed. The catheter was removed and a dressing applied. FINDINGS: A total of approximately 1.1 L of amber colored fluid was removed. Samples were sent to the laboratory as requested by the clinical team. IMPRESSION: Successful ultrasound guided right thoracentesis yielding 1.1 L  of pleural fluid. Read by: Ascencion Dike PA-C Electronically Signed   By: Corrie Mckusick D.O.   On: 01/16/2018 16:19     Labs:   Basic Metabolic Panel: Recent Labs  Lab 01/15/18 2030 01/16/18 0551 01/17/18 0535 01/18/18 0605  NA 136 138 137 137  K 3.9 3.8 3.8 3.9  CL 100 101 102 100  CO2 _0 GLUCOSE 132* 92 91 97  BUN 37* 34* 27* 26*  CREATININE 1.81* 1.74* 1.61* 1.57*  CALCIUM 8.1* 8.0* 7.9* 8.3*   GFR Estimated Creatinine Clearance: 29.9 mL/min (A) (by C-G formula based on SCr of 1.57 mg/dL (H)). Liver Function Tests: Recent Labs  Lab 01/16/18 0551  AST 21  ALT 21  ALKPHOS 123  BILITOT 0.6  PROT 5.5*  ALBUMIN 2.4*   No results for input(s): LIPASE, AMYLASE in the last 168 hours. No results for input(s): AMMONIA in the last 168 hours. Coagulation profile No results for input(s): INR, PROTIME in the last 168 hours.  CBC: Recent Labs  Lab 01/15/18 2030 01/16/18 0551 01/17/18 0535 01/18/18 0605  WBC 8.2 6.6 5.6 5.3  HGB 10.9* 10.2* 9.9* 11.1*  HCT 34.7* 30.9* 31.4* 34.7*  MCV 105.8* 102.0* 105.0* 103.9*  PLT 167 151 163 157   Cardiac Enzymes: Recent Labs  Lab 01/15/18 2355 01/16/18 0551 01/16/18 1223  TROPONINI 0.08* 0.08* 0.08*   BNP: Invalid input(s): POCBNP CBG: Recent Labs  Lab 01/17/18 1205  GLUCAP 101*   D-Dimer No results for input(s): DDIMER in the last 72 hours. Hgb A1c No results for input(s): HGBA1C in the last 72 hours. Lipid Profile No results for input(s): CHOL, HDL, LDLCALC, TRIG, CHOLHDL, LDLDIRECT in the last 72 hours. Thyroid function studies No results for input(s): TSH, T4TOTAL, T3FREE, THYROIDAB in the last 72 hours.  Invalid input(s): FREET3 Anemia work up No results for input(s): VITAMINB12, FOLATE, FERRITIN, TIBC, IRON, RETICCTPCT in the last 72 hours. Microbiology Recent Results (from the past 240 hour(s))  Gram stain     Status: None   Collection Time: 01/16/18  1:49 PM  Result Value Ref Range Status    Specimen Description PLEURAL  Final   Special Requests RIGHT  Final   Gram Stain   Final    MODERATE WBC PRESENT,BOTH PMN AND MONONUCLEAR NO ORGANISMS SEEN Performed at [facility], [address], [place], [place] [address]    Report Status 01/16/2018 FINAL  Final  Culture, body fluid-bottle     Status: None (Preliminary result)   Collection Time: 01/16/18  1:49 PM  Result Value Ref Range Status   Specimen Description PLEURAL FLUID  Final   Special Requests RIGHT  Final   Culture   Final    NO GROWTH 1 DAY Performed at [facility], [place] [address], [place],  [address]    Report Status PENDING  Incomplete     Discharge Instructions:   Discharge Instructions    (HEART FAILURE PATIENTS) Call MD:  Anytime you have any of the following symptoms: 1) 3 pound weight gain in 24 hours or 5 pounds in 1 week 2) shortness of breath, with or without a dry hacking cough 3) swelling in the hands,  feet or stomach 4) if you have to sleep on extra pillows at night in order to breathe.   Complete by:  As directed    Discharge instructions   Complete by:  As directed    DYS 3 diet   Increase activity slowly   Complete by:  As directed      Allergies as of 01/18/2018      Reactions   Prednisone Other (See Comments)   Causes Afib   Morphine And Related Other (See Comments)   Agitation, "made him mean" per granddaughter   Penicillins Other (See Comments)   Has patient had a PCN reaction causing immediate rash, facial/tongue/throat swelling, SOB or lightheadedness with hypotension: Unknown Has patient had a PCN reaction causing severe rash involving mucus membranes or skin necrosis: Unknown Has patient had a PCN reaction that required hospitalization: Unknown Has patient had a PCN reaction occurring within the last 10 years: Unknown If all of the above answers are "NO", then may proceed with Cephalosporin use.      Medication List    STOP taking these medications    amLODipine 5 MG tablet Commonly known as:  NORVASC     TAKE these medications   acetaminophen 325 MG tablet Commonly known as:  TYLENOL Take 650 mg by mouth every 6 (six) hours as needed (pain).   allopurinol 100 MG tablet Commonly known as:  ZYLOPRIM Take 100 mg by mouth daily.   aspirin EC 81 MG tablet Take 81 mg by mouth daily.   atorvastatin 10 MG tablet Commonly known as:  LIPITOR Take 10 mg by mouth every evening.   CALCIUM 600+D 600-400 MG-UNIT tablet Generic drug:  Calcium Carbonate-Vitamin D Take 1 tablet by mouth every evening.   docusate 50 MG/5ML liquid Commonly known as:  COLACE Take 100 mg by mouth 2 (two) times daily.   ferrous sulfate 325 (65 FE) MG tablet Take 325 mg by mouth daily.   Fish Oil 1000 MG Caps Take 1,000 mg by mouth every evening.   furosemide 20 MG tablet Commonly known as:  LASIX 1 po daily with an extra PRN for edema (total of 40 mg/day if needed) What changed:    how much to take  how to take this  additional instructions   gabapentin 600 MG tablet Commonly known as:  NEURONTIN Take 0.5 tablets (300 mg total) by mouth 3 (three) times daily. What changed:    how much to take  when to take this   levothyroxine 50 MCG tablet Commonly known as:  SYNTHROID, LEVOTHROID Take 50 mcg by mouth daily at 6 (six) AM.   metoprolol tartrate 25 MG tablet Commonly known as:  LOPRESSOR Take 0.5 tablets (12.5 mg total) by mouth 2 (two) times daily. What changed:  additional instructions   NUTRITIONAL SUPPLEMENT Liqd Take 120 mLs by mouth 2 (two) times daily. MedPass   nystatin 100000 UNIT/ML suspension Commonly known as:  MYCOSTATIN Use as directed 15 mLs in the mouth or throat See admin instructions. Swish and spit 15 ml every 6 hours as needed for mouth pain   polyethylene glycol packet Commonly known as:  MIRALAX / GLYCOLAX Take 17 g by mouth daily. Start taking on:  01/19/2018   sertraline 25 MG tablet Commonly known as:   ZOLOFT Take 25 mg by mouth at bedtime.   vitamin B-12 500 MCG tablet Commonly known as:  CYANOCOBALAMIN Take 1,000 mcg by mouth daily.      Follow-up Information  Nona Dell, Corene Cornea, MD Follow up in 1 week(s).   Specialty:  Internal Medicine Contact information: [address] [place] [address] [phone]        Rod Can, MD Follow up in 2 week(s).   Specialty:  Orthopedic Surgery Contact information: [address] [place] [address] [phone]            Time coordinating discharge: 25 min  Signed:  Geradine Girt  [facility] 01/18/2018, 2:43 PM

## 2018-01-20 DIAGNOSIS — Z87898 Personal history of other specified conditions: Secondary | ICD-10-CM | POA: Diagnosis not present

## 2018-01-20 DIAGNOSIS — I480 Paroxysmal atrial fibrillation: Secondary | ICD-10-CM | POA: Diagnosis not present

## 2018-01-20 DIAGNOSIS — I5020 Unspecified systolic (congestive) heart failure: Secondary | ICD-10-CM | POA: Diagnosis not present

## 2018-01-20 DIAGNOSIS — R5381 Other malaise: Secondary | ICD-10-CM | POA: Diagnosis not present

## 2018-01-21 LAB — CULTURE, BODY FLUID W GRAM STAIN -BOTTLE

## 2018-01-21 LAB — CULTURE, BODY FLUID-BOTTLE: CULTURE: NO GROWTH

## 2018-01-23 ENCOUNTER — Other Ambulatory Visit: Payer: Self-pay | Admitting: *Deleted

## 2018-01-23 ENCOUNTER — Encounter: Payer: Medicare Other | Admitting: *Deleted

## 2018-01-23 NOTE — Patient Outreach (Signed)
[facility]) Care Management  01/23/2018  Thomas Hooper. [birth year] [id number]   CSW made contact with pr's family today who confirm pt was readmitted to hospital and now has been placed at [facility]. His progress is slow, per family report. CSW voiced concerns related to long term plans and per family, [name], they plan to consider moving him in with them when he is released from SNF.  CSW will plan on SNF collaboration to assist, support and determine needs.   Eduard Clos, MSW, [place] Worker  [place] [phone]

## 2018-01-25 ENCOUNTER — Other Ambulatory Visit: Payer: Self-pay | Admitting: *Deleted

## 2018-01-25 ENCOUNTER — Encounter: Payer: Self-pay | Admitting: Cardiology

## 2018-01-25 DIAGNOSIS — Z79899 Other long term (current) drug therapy: Secondary | ICD-10-CM | POA: Diagnosis not present

## 2018-01-25 NOTE — Patient Outreach (Signed)
[facility]) Care Management  01/25/2018  Thomas Hooper. April 22, 1925 [id number]   CSW spoke with SNF rep today who reports pt is making poor progress with therapy. They anticipate a dc to home with family soon as insurance continued coverage  Is not expected due to his slow progress with PT. Family plans to take pt home to their (granddaughter's home) with family and hired help to ensure 24 hour care.  No dc date has been determined.  CSW will plan a f/u call to family for further discussion about dc and needs.      Eduard Clos, MSW, [place] Worker  [place] [phone]

## 2018-01-26 ENCOUNTER — Other Ambulatory Visit: Payer: Self-pay | Admitting: *Deleted

## 2018-01-26 NOTE — Patient Outreach (Signed)
[facility]) Care Management  01/26/2018  Thomas Hooper. November 10, 1924 [id number]   Message received from [place] related to patient's progress while at [facility].  Information relayed to [facility] CM LCSW on patient's progress at facility. Marcie Bal aware of patient's decrease in progress, and is continuing to follow patient while at facility and is assisting granddaughter as needed.  Rutherford Limerick RN, BSN [place] Acute Care Coordinator [phone]) Business Mobile [phone]) Toll free office

## 2018-01-28 ENCOUNTER — Encounter (HOSPITAL_COMMUNITY): Payer: Self-pay | Admitting: Orthopedic Surgery

## 2018-01-28 NOTE — OR Nursing (Signed)
Late entry due to correction of data entry error of surgical times.

## 2018-01-30 ENCOUNTER — Other Ambulatory Visit: Payer: Self-pay | Admitting: *Deleted

## 2018-01-30 DIAGNOSIS — Z471 Aftercare following joint replacement surgery: Secondary | ICD-10-CM | POA: Diagnosis not present

## 2018-01-30 DIAGNOSIS — M1612 Unilateral primary osteoarthritis, left hip: Secondary | ICD-10-CM | POA: Diagnosis not present

## 2018-01-30 DIAGNOSIS — Z4789 Encounter for other orthopedic aftercare: Secondary | ICD-10-CM | POA: Diagnosis not present

## 2018-01-30 DIAGNOSIS — Z96649 Presence of unspecified artificial hip joint: Secondary | ICD-10-CM | POA: Diagnosis not present

## 2018-01-30 NOTE — Patient Outreach (Signed)
Thomas Hooper) Care Management  01/30/2018  Thomas Hooper. 04/01/1925 [id number]   CSW visited pt at [facility] today. [name], Thomas Hooper, was at bedside with pt's brother visiting from [place].  Pt returned from a MD appointment while CSW was visiting. CSW discussed dc plans with family; per granddaughter, they are planning for dc home (Maybe this coming weekend) to her home with family Thomas Hooper, her husband and 14yo son) and will hire private duty care for the hours that family is at work. Family has begun to look into agencies to hire and Thomas Hooper reports she has taken an FMLA initially at time of dc.  Per family, there will be a Palliative consult (maybe once released to home) and pt does not have an Forensic scientist. Discussed this process with family (including Guardianship if pt is not able to coherently sign papers).  CSW plans to follow up with SNF rep for further discussion on pt needs at dc and regarding tentative dc date.  CSW will also plan to mail resources to family (RCATS, Regulatory affairs officer, senior center programs, etc) and follow up for updates later this week.   Thomas Hooper, MSW, [place] Worker  [place] [phone]

## 2018-02-02 ENCOUNTER — Other Ambulatory Visit: Payer: Self-pay | Admitting: *Deleted

## 2018-02-02 NOTE — Patient Outreach (Signed)
[facility]) Care Management  02/02/2018  Thomas Hooper. Aug 30, 1924 [id number]   Pt is set for dc this weekend per SNF rep and HH/DME to be arranged.Granddaughter planning to take an FMLA initially to be with pt. CSW will ask covering CSW to touch base next week to confirm SNF dc and to engage [facility] RNCM for post SNF dc follow up.   Eduard Clos, MSW, [place] Worker  [place] [phone]

## 2018-02-07 ENCOUNTER — Other Ambulatory Visit: Payer: Self-pay | Admitting: *Deleted

## 2018-02-07 DIAGNOSIS — Z8659 Personal history of other mental and behavioral disorders: Secondary | ICD-10-CM | POA: Diagnosis not present

## 2018-02-07 DIAGNOSIS — F0390 Unspecified dementia without behavioral disturbance: Secondary | ICD-10-CM | POA: Diagnosis not present

## 2018-02-07 DIAGNOSIS — Z8639 Personal history of other endocrine, nutritional and metabolic disease: Secondary | ICD-10-CM | POA: Diagnosis not present

## 2018-02-07 DIAGNOSIS — N184 Chronic kidney disease, stage 4 (severe): Secondary | ICD-10-CM | POA: Diagnosis not present

## 2018-02-07 DIAGNOSIS — Z9181 History of falling: Secondary | ICD-10-CM | POA: Diagnosis not present

## 2018-02-07 DIAGNOSIS — Z8781 Personal history of (healed) traumatic fracture: Secondary | ICD-10-CM | POA: Diagnosis not present

## 2018-02-07 DIAGNOSIS — I509 Heart failure, unspecified: Secondary | ICD-10-CM | POA: Diagnosis not present

## 2018-02-07 DIAGNOSIS — Z8679 Personal history of other diseases of the circulatory system: Secondary | ICD-10-CM | POA: Diagnosis not present

## 2018-02-07 DIAGNOSIS — Z8739 Personal history of other diseases of the musculoskeletal system and connective tissue: Secondary | ICD-10-CM | POA: Diagnosis not present

## 2018-02-07 LAB — CUP PACEART REMOTE DEVICE CHECK
Date Time Interrogation Session: 20190717213701
Implantable Pulse Generator Implant Date: 20181205

## 2018-02-07 NOTE — Patient Outreach (Signed)
[facility]) Care Management  02/07/2018  Kamryn Gauthier [birth year] [id number]  CSW is covering for Eduard Clos, [place] colleague, also with [place] [facility], in her absence.  CSW was able to make contact with patient's granddaughter, [name] today to discuss patient's recent discharge from [facility] where patient was residing to receive short-term rehabilitative services.  According to Mrs. Natividad Brood, she has cancelled home health services through [facility] and has elected end-of-life care services through [facility].  Mrs. Natividad Brood reported that she cancelled [facility] services because she realizes that patient is not going to be willing to work with therapies (both physical and occupational), as patient reports that he "just wishes to be left alone".  Mrs. Natividad Brood mentioned that she plans to take FMLA ([place]) to care for patient in the home.  Mrs. Natividad Brood admitted that patient's pain seems to be under control but that a nurse from [facility] will be by the house later today to assess further.  [facility] will also assess patient's need for durable medical equipment.  Mrs. Natividad Brood indicated that she received the packet of resource information mailed to her by Ms. Marcelline Deist, but that she does not have any specific questions and/or concerns at present. CSW will refrain from referring patient to an RNCM through [facility], as patient will be followed by a nurse through [facility].  CSW will report findings of conversation with Mrs. Natividad Brood to Ms. Marcelline Deist, as it appears that no additional social work needs have been identified at this time.  Nat Christen, BSW, MSW, LCSW  Licensed Education officer, [facility]  Mailing [place] [address], [place], [place] [address] Physical Address-[address], [place], [place] [address] Toll Free Main # [phone] Fax # [phone] Cell # [phone]  Office # [phone] [email or web address]

## 2018-02-08 ENCOUNTER — Other Ambulatory Visit: Payer: Self-pay | Admitting: *Deleted

## 2018-02-08 DIAGNOSIS — Z8639 Personal history of other endocrine, nutritional and metabolic disease: Secondary | ICD-10-CM | POA: Diagnosis not present

## 2018-02-08 DIAGNOSIS — I509 Heart failure, unspecified: Secondary | ICD-10-CM | POA: Diagnosis not present

## 2018-02-08 DIAGNOSIS — F0390 Unspecified dementia without behavioral disturbance: Secondary | ICD-10-CM | POA: Diagnosis not present

## 2018-02-08 DIAGNOSIS — Z8739 Personal history of other diseases of the musculoskeletal system and connective tissue: Secondary | ICD-10-CM | POA: Diagnosis not present

## 2018-02-08 DIAGNOSIS — Z8679 Personal history of other diseases of the circulatory system: Secondary | ICD-10-CM | POA: Diagnosis not present

## 2018-02-08 DIAGNOSIS — N184 Chronic kidney disease, stage 4 (severe): Secondary | ICD-10-CM | POA: Diagnosis not present

## 2018-02-08 NOTE — Patient Outreach (Addendum)
[facility]) Care Management  02/08/2018  Airik Goodlin. 06-07-1925 [id number]   Received referral to [facility] care coordinator after [facility]. Noted [facility] Social worker Nat Christen, report that patient to be discharged home with [facility].  Placed call to [facility], representative report as of 9/3  Patient is enrolled in Hospice services.   Plan  Case not opened to [facility] community care coordinator, will mark as not active due to patient being active with [facility].    Joylene Draft, RN, [place] Management Coordinator  [phone]- Mobile [phone]- Toll Free Main Office

## 2018-02-10 DIAGNOSIS — F0390 Unspecified dementia without behavioral disturbance: Secondary | ICD-10-CM | POA: Diagnosis not present

## 2018-02-10 DIAGNOSIS — Z8639 Personal history of other endocrine, nutritional and metabolic disease: Secondary | ICD-10-CM | POA: Diagnosis not present

## 2018-02-10 DIAGNOSIS — Z8679 Personal history of other diseases of the circulatory system: Secondary | ICD-10-CM | POA: Diagnosis not present

## 2018-02-10 DIAGNOSIS — N184 Chronic kidney disease, stage 4 (severe): Secondary | ICD-10-CM | POA: Diagnosis not present

## 2018-02-10 DIAGNOSIS — Z8739 Personal history of other diseases of the musculoskeletal system and connective tissue: Secondary | ICD-10-CM | POA: Diagnosis not present

## 2018-02-10 DIAGNOSIS — I509 Heart failure, unspecified: Secondary | ICD-10-CM | POA: Diagnosis not present

## 2018-02-14 ENCOUNTER — Ambulatory Visit: Payer: Self-pay | Admitting: *Deleted

## 2018-02-14 DIAGNOSIS — N184 Chronic kidney disease, stage 4 (severe): Secondary | ICD-10-CM | POA: Diagnosis not present

## 2018-02-14 DIAGNOSIS — I509 Heart failure, unspecified: Secondary | ICD-10-CM | POA: Diagnosis not present

## 2018-02-14 DIAGNOSIS — F0390 Unspecified dementia without behavioral disturbance: Secondary | ICD-10-CM | POA: Diagnosis not present

## 2018-02-14 DIAGNOSIS — Z8639 Personal history of other endocrine, nutritional and metabolic disease: Secondary | ICD-10-CM | POA: Diagnosis not present

## 2018-02-14 DIAGNOSIS — Z8679 Personal history of other diseases of the circulatory system: Secondary | ICD-10-CM | POA: Diagnosis not present

## 2018-02-14 DIAGNOSIS — Z8739 Personal history of other diseases of the musculoskeletal system and connective tissue: Secondary | ICD-10-CM | POA: Diagnosis not present

## 2018-02-15 ENCOUNTER — Other Ambulatory Visit: Payer: Self-pay | Admitting: *Deleted

## 2018-02-16 DIAGNOSIS — F0390 Unspecified dementia without behavioral disturbance: Secondary | ICD-10-CM | POA: Diagnosis not present

## 2018-02-16 DIAGNOSIS — N184 Chronic kidney disease, stage 4 (severe): Secondary | ICD-10-CM | POA: Diagnosis not present

## 2018-02-16 DIAGNOSIS — I509 Heart failure, unspecified: Secondary | ICD-10-CM | POA: Diagnosis not present

## 2018-02-16 DIAGNOSIS — Z8679 Personal history of other diseases of the circulatory system: Secondary | ICD-10-CM | POA: Diagnosis not present

## 2018-02-16 DIAGNOSIS — Z8739 Personal history of other diseases of the musculoskeletal system and connective tissue: Secondary | ICD-10-CM | POA: Diagnosis not present

## 2018-02-16 DIAGNOSIS — Z8639 Personal history of other endocrine, nutritional and metabolic disease: Secondary | ICD-10-CM | POA: Diagnosis not present

## 2018-02-20 DIAGNOSIS — I509 Heart failure, unspecified: Secondary | ICD-10-CM | POA: Diagnosis not present

## 2018-02-20 DIAGNOSIS — F0390 Unspecified dementia without behavioral disturbance: Secondary | ICD-10-CM | POA: Diagnosis not present

## 2018-02-20 DIAGNOSIS — Z8679 Personal history of other diseases of the circulatory system: Secondary | ICD-10-CM | POA: Diagnosis not present

## 2018-02-20 DIAGNOSIS — Z8739 Personal history of other diseases of the musculoskeletal system and connective tissue: Secondary | ICD-10-CM | POA: Diagnosis not present

## 2018-02-20 DIAGNOSIS — Z8639 Personal history of other endocrine, nutritional and metabolic disease: Secondary | ICD-10-CM | POA: Diagnosis not present

## 2018-02-20 DIAGNOSIS — N184 Chronic kidney disease, stage 4 (severe): Secondary | ICD-10-CM | POA: Diagnosis not present

## 2018-02-21 DIAGNOSIS — Z8739 Personal history of other diseases of the musculoskeletal system and connective tissue: Secondary | ICD-10-CM | POA: Diagnosis not present

## 2018-02-21 DIAGNOSIS — F0390 Unspecified dementia without behavioral disturbance: Secondary | ICD-10-CM | POA: Diagnosis not present

## 2018-02-21 DIAGNOSIS — I509 Heart failure, unspecified: Secondary | ICD-10-CM | POA: Diagnosis not present

## 2018-02-21 DIAGNOSIS — Z8679 Personal history of other diseases of the circulatory system: Secondary | ICD-10-CM | POA: Diagnosis not present

## 2018-02-21 DIAGNOSIS — Z8639 Personal history of other endocrine, nutritional and metabolic disease: Secondary | ICD-10-CM | POA: Diagnosis not present

## 2018-02-21 DIAGNOSIS — N184 Chronic kidney disease, stage 4 (severe): Secondary | ICD-10-CM | POA: Diagnosis not present

## 2018-02-22 ENCOUNTER — Encounter: Payer: Self-pay | Admitting: Cardiology

## 2018-02-22 DIAGNOSIS — Z8679 Personal history of other diseases of the circulatory system: Secondary | ICD-10-CM | POA: Diagnosis not present

## 2018-02-22 DIAGNOSIS — F0390 Unspecified dementia without behavioral disturbance: Secondary | ICD-10-CM | POA: Diagnosis not present

## 2018-02-22 DIAGNOSIS — Z8739 Personal history of other diseases of the musculoskeletal system and connective tissue: Secondary | ICD-10-CM | POA: Diagnosis not present

## 2018-02-22 DIAGNOSIS — Z8639 Personal history of other endocrine, nutritional and metabolic disease: Secondary | ICD-10-CM | POA: Diagnosis not present

## 2018-02-22 DIAGNOSIS — N184 Chronic kidney disease, stage 4 (severe): Secondary | ICD-10-CM | POA: Diagnosis not present

## 2018-02-22 DIAGNOSIS — I509 Heart failure, unspecified: Secondary | ICD-10-CM | POA: Diagnosis not present

## 2018-02-24 DIAGNOSIS — Z8639 Personal history of other endocrine, nutritional and metabolic disease: Secondary | ICD-10-CM | POA: Diagnosis not present

## 2018-02-24 DIAGNOSIS — Z8739 Personal history of other diseases of the musculoskeletal system and connective tissue: Secondary | ICD-10-CM | POA: Diagnosis not present

## 2018-02-24 DIAGNOSIS — N184 Chronic kidney disease, stage 4 (severe): Secondary | ICD-10-CM | POA: Diagnosis not present

## 2018-02-24 DIAGNOSIS — F0390 Unspecified dementia without behavioral disturbance: Secondary | ICD-10-CM | POA: Diagnosis not present

## 2018-02-24 DIAGNOSIS — Z8679 Personal history of other diseases of the circulatory system: Secondary | ICD-10-CM | POA: Diagnosis not present

## 2018-02-24 DIAGNOSIS — I509 Heart failure, unspecified: Secondary | ICD-10-CM | POA: Diagnosis not present

## 2018-02-26 DIAGNOSIS — N184 Chronic kidney disease, stage 4 (severe): Secondary | ICD-10-CM | POA: Diagnosis not present

## 2018-02-26 DIAGNOSIS — Z8639 Personal history of other endocrine, nutritional and metabolic disease: Secondary | ICD-10-CM | POA: Diagnosis not present

## 2018-02-26 DIAGNOSIS — F0390 Unspecified dementia without behavioral disturbance: Secondary | ICD-10-CM | POA: Diagnosis not present

## 2018-02-26 DIAGNOSIS — Z8679 Personal history of other diseases of the circulatory system: Secondary | ICD-10-CM | POA: Diagnosis not present

## 2018-02-26 DIAGNOSIS — Z8739 Personal history of other diseases of the musculoskeletal system and connective tissue: Secondary | ICD-10-CM | POA: Diagnosis not present

## 2018-02-26 DIAGNOSIS — I509 Heart failure, unspecified: Secondary | ICD-10-CM | POA: Diagnosis not present

## 2018-02-27 DIAGNOSIS — Z8739 Personal history of other diseases of the musculoskeletal system and connective tissue: Secondary | ICD-10-CM | POA: Diagnosis not present

## 2018-02-27 DIAGNOSIS — I509 Heart failure, unspecified: Secondary | ICD-10-CM | POA: Diagnosis not present

## 2018-02-27 DIAGNOSIS — N184 Chronic kidney disease, stage 4 (severe): Secondary | ICD-10-CM | POA: Diagnosis not present

## 2018-02-27 DIAGNOSIS — F0390 Unspecified dementia without behavioral disturbance: Secondary | ICD-10-CM | POA: Diagnosis not present

## 2018-02-27 DIAGNOSIS — Z8679 Personal history of other diseases of the circulatory system: Secondary | ICD-10-CM | POA: Diagnosis not present

## 2018-02-27 DIAGNOSIS — Z8639 Personal history of other endocrine, nutritional and metabolic disease: Secondary | ICD-10-CM | POA: Diagnosis not present

## 2018-02-28 DIAGNOSIS — F0390 Unspecified dementia without behavioral disturbance: Secondary | ICD-10-CM | POA: Diagnosis not present

## 2018-02-28 DIAGNOSIS — Z8739 Personal history of other diseases of the musculoskeletal system and connective tissue: Secondary | ICD-10-CM | POA: Diagnosis not present

## 2018-02-28 DIAGNOSIS — Z8639 Personal history of other endocrine, nutritional and metabolic disease: Secondary | ICD-10-CM | POA: Diagnosis not present

## 2018-02-28 DIAGNOSIS — I509 Heart failure, unspecified: Secondary | ICD-10-CM | POA: Diagnosis not present

## 2018-02-28 DIAGNOSIS — Z8679 Personal history of other diseases of the circulatory system: Secondary | ICD-10-CM | POA: Diagnosis not present

## 2018-02-28 DIAGNOSIS — N184 Chronic kidney disease, stage 4 (severe): Secondary | ICD-10-CM | POA: Diagnosis not present

## 2018-03-01 DIAGNOSIS — Z8639 Personal history of other endocrine, nutritional and metabolic disease: Secondary | ICD-10-CM | POA: Diagnosis not present

## 2018-03-01 DIAGNOSIS — Z8679 Personal history of other diseases of the circulatory system: Secondary | ICD-10-CM | POA: Diagnosis not present

## 2018-03-01 DIAGNOSIS — F0390 Unspecified dementia without behavioral disturbance: Secondary | ICD-10-CM | POA: Diagnosis not present

## 2018-03-01 DIAGNOSIS — Z8739 Personal history of other diseases of the musculoskeletal system and connective tissue: Secondary | ICD-10-CM | POA: Diagnosis not present

## 2018-03-01 DIAGNOSIS — N184 Chronic kidney disease, stage 4 (severe): Secondary | ICD-10-CM | POA: Diagnosis not present

## 2018-03-01 DIAGNOSIS — I509 Heart failure, unspecified: Secondary | ICD-10-CM | POA: Diagnosis not present

## 2018-03-03 DIAGNOSIS — Z8739 Personal history of other diseases of the musculoskeletal system and connective tissue: Secondary | ICD-10-CM | POA: Diagnosis not present

## 2018-03-03 DIAGNOSIS — Z8639 Personal history of other endocrine, nutritional and metabolic disease: Secondary | ICD-10-CM | POA: Diagnosis not present

## 2018-03-03 DIAGNOSIS — Z8679 Personal history of other diseases of the circulatory system: Secondary | ICD-10-CM | POA: Diagnosis not present

## 2018-03-03 DIAGNOSIS — N184 Chronic kidney disease, stage 4 (severe): Secondary | ICD-10-CM | POA: Diagnosis not present

## 2018-03-03 DIAGNOSIS — I509 Heart failure, unspecified: Secondary | ICD-10-CM | POA: Diagnosis not present

## 2018-03-03 DIAGNOSIS — F0390 Unspecified dementia without behavioral disturbance: Secondary | ICD-10-CM | POA: Diagnosis not present

## 2018-03-04 DIAGNOSIS — Z8739 Personal history of other diseases of the musculoskeletal system and connective tissue: Secondary | ICD-10-CM | POA: Diagnosis not present

## 2018-03-04 DIAGNOSIS — Z8679 Personal history of other diseases of the circulatory system: Secondary | ICD-10-CM | POA: Diagnosis not present

## 2018-03-04 DIAGNOSIS — Z8639 Personal history of other endocrine, nutritional and metabolic disease: Secondary | ICD-10-CM | POA: Diagnosis not present

## 2018-03-04 DIAGNOSIS — N184 Chronic kidney disease, stage 4 (severe): Secondary | ICD-10-CM | POA: Diagnosis not present

## 2018-03-04 DIAGNOSIS — I509 Heart failure, unspecified: Secondary | ICD-10-CM | POA: Diagnosis not present

## 2018-03-04 DIAGNOSIS — F0390 Unspecified dementia without behavioral disturbance: Secondary | ICD-10-CM | POA: Diagnosis not present

## 2018-03-06 DIAGNOSIS — Z8639 Personal history of other endocrine, nutritional and metabolic disease: Secondary | ICD-10-CM | POA: Diagnosis not present

## 2018-03-06 DIAGNOSIS — F0390 Unspecified dementia without behavioral disturbance: Secondary | ICD-10-CM | POA: Diagnosis not present

## 2018-03-06 DIAGNOSIS — Z8739 Personal history of other diseases of the musculoskeletal system and connective tissue: Secondary | ICD-10-CM | POA: Diagnosis not present

## 2018-03-06 DIAGNOSIS — N184 Chronic kidney disease, stage 4 (severe): Secondary | ICD-10-CM | POA: Diagnosis not present

## 2018-03-06 DIAGNOSIS — Z8679 Personal history of other diseases of the circulatory system: Secondary | ICD-10-CM | POA: Diagnosis not present

## 2018-03-06 DIAGNOSIS — I509 Heart failure, unspecified: Secondary | ICD-10-CM | POA: Diagnosis not present

## 2018-03-07 DIAGNOSIS — I509 Heart failure, unspecified: Secondary | ICD-10-CM | POA: Diagnosis not present

## 2018-03-07 DIAGNOSIS — Z8679 Personal history of other diseases of the circulatory system: Secondary | ICD-10-CM | POA: Diagnosis not present

## 2018-03-07 DIAGNOSIS — Z8739 Personal history of other diseases of the musculoskeletal system and connective tissue: Secondary | ICD-10-CM | POA: Diagnosis not present

## 2018-03-07 DIAGNOSIS — Z9181 History of falling: Secondary | ICD-10-CM | POA: Diagnosis not present

## 2018-03-07 DIAGNOSIS — Z8781 Personal history of (healed) traumatic fracture: Secondary | ICD-10-CM | POA: Diagnosis not present

## 2018-03-07 DIAGNOSIS — Z8639 Personal history of other endocrine, nutritional and metabolic disease: Secondary | ICD-10-CM | POA: Diagnosis not present

## 2018-03-07 DIAGNOSIS — F0390 Unspecified dementia without behavioral disturbance: Secondary | ICD-10-CM | POA: Diagnosis not present

## 2018-03-07 DIAGNOSIS — N184 Chronic kidney disease, stage 4 (severe): Secondary | ICD-10-CM | POA: Diagnosis not present

## 2018-03-07 DIAGNOSIS — Z8659 Personal history of other mental and behavioral disorders: Secondary | ICD-10-CM | POA: Diagnosis not present

## 2018-03-08 DIAGNOSIS — Z8739 Personal history of other diseases of the musculoskeletal system and connective tissue: Secondary | ICD-10-CM | POA: Diagnosis not present

## 2018-03-08 DIAGNOSIS — I509 Heart failure, unspecified: Secondary | ICD-10-CM | POA: Diagnosis not present

## 2018-03-08 DIAGNOSIS — F0390 Unspecified dementia without behavioral disturbance: Secondary | ICD-10-CM | POA: Diagnosis not present

## 2018-03-08 DIAGNOSIS — Z8639 Personal history of other endocrine, nutritional and metabolic disease: Secondary | ICD-10-CM | POA: Diagnosis not present

## 2018-03-08 DIAGNOSIS — Z8679 Personal history of other diseases of the circulatory system: Secondary | ICD-10-CM | POA: Diagnosis not present

## 2018-03-08 DIAGNOSIS — N184 Chronic kidney disease, stage 4 (severe): Secondary | ICD-10-CM | POA: Diagnosis not present

## 2018-03-09 ENCOUNTER — Ambulatory Visit: Payer: Medicare Other | Admitting: Cardiology

## 2018-03-09 DIAGNOSIS — Z8679 Personal history of other diseases of the circulatory system: Secondary | ICD-10-CM | POA: Diagnosis not present

## 2018-03-09 DIAGNOSIS — Z8739 Personal history of other diseases of the musculoskeletal system and connective tissue: Secondary | ICD-10-CM | POA: Diagnosis not present

## 2018-03-09 DIAGNOSIS — N184 Chronic kidney disease, stage 4 (severe): Secondary | ICD-10-CM | POA: Diagnosis not present

## 2018-03-09 DIAGNOSIS — F0390 Unspecified dementia without behavioral disturbance: Secondary | ICD-10-CM | POA: Diagnosis not present

## 2018-03-09 DIAGNOSIS — I509 Heart failure, unspecified: Secondary | ICD-10-CM | POA: Diagnosis not present

## 2018-03-09 DIAGNOSIS — Z8639 Personal history of other endocrine, nutritional and metabolic disease: Secondary | ICD-10-CM | POA: Diagnosis not present

## 2018-03-10 DIAGNOSIS — Z8639 Personal history of other endocrine, nutritional and metabolic disease: Secondary | ICD-10-CM | POA: Diagnosis not present

## 2018-03-10 DIAGNOSIS — Z8739 Personal history of other diseases of the musculoskeletal system and connective tissue: Secondary | ICD-10-CM | POA: Diagnosis not present

## 2018-03-10 DIAGNOSIS — F0390 Unspecified dementia without behavioral disturbance: Secondary | ICD-10-CM | POA: Diagnosis not present

## 2018-03-10 DIAGNOSIS — I509 Heart failure, unspecified: Secondary | ICD-10-CM | POA: Diagnosis not present

## 2018-03-10 DIAGNOSIS — Z8679 Personal history of other diseases of the circulatory system: Secondary | ICD-10-CM | POA: Diagnosis not present

## 2018-03-10 DIAGNOSIS — N184 Chronic kidney disease, stage 4 (severe): Secondary | ICD-10-CM | POA: Diagnosis not present

## 2018-03-12 DIAGNOSIS — Z8739 Personal history of other diseases of the musculoskeletal system and connective tissue: Secondary | ICD-10-CM | POA: Diagnosis not present

## 2018-03-12 DIAGNOSIS — Z8679 Personal history of other diseases of the circulatory system: Secondary | ICD-10-CM | POA: Diagnosis not present

## 2018-03-12 DIAGNOSIS — I509 Heart failure, unspecified: Secondary | ICD-10-CM | POA: Diagnosis not present

## 2018-03-12 DIAGNOSIS — N184 Chronic kidney disease, stage 4 (severe): Secondary | ICD-10-CM | POA: Diagnosis not present

## 2018-03-12 DIAGNOSIS — Z8639 Personal history of other endocrine, nutritional and metabolic disease: Secondary | ICD-10-CM | POA: Diagnosis not present

## 2018-03-12 DIAGNOSIS — F0390 Unspecified dementia without behavioral disturbance: Secondary | ICD-10-CM | POA: Diagnosis not present

## 2018-03-13 DIAGNOSIS — Z8679 Personal history of other diseases of the circulatory system: Secondary | ICD-10-CM | POA: Diagnosis not present

## 2018-03-13 DIAGNOSIS — Z8739 Personal history of other diseases of the musculoskeletal system and connective tissue: Secondary | ICD-10-CM | POA: Diagnosis not present

## 2018-03-13 DIAGNOSIS — I509 Heart failure, unspecified: Secondary | ICD-10-CM | POA: Diagnosis not present

## 2018-03-13 DIAGNOSIS — Z8639 Personal history of other endocrine, nutritional and metabolic disease: Secondary | ICD-10-CM | POA: Diagnosis not present

## 2018-03-13 DIAGNOSIS — N184 Chronic kidney disease, stage 4 (severe): Secondary | ICD-10-CM | POA: Diagnosis not present

## 2018-03-13 DIAGNOSIS — F0390 Unspecified dementia without behavioral disturbance: Secondary | ICD-10-CM | POA: Diagnosis not present

## 2018-03-14 DIAGNOSIS — Z8639 Personal history of other endocrine, nutritional and metabolic disease: Secondary | ICD-10-CM | POA: Diagnosis not present

## 2018-03-14 DIAGNOSIS — N184 Chronic kidney disease, stage 4 (severe): Secondary | ICD-10-CM | POA: Diagnosis not present

## 2018-03-14 DIAGNOSIS — I509 Heart failure, unspecified: Secondary | ICD-10-CM | POA: Diagnosis not present

## 2018-03-14 DIAGNOSIS — Z8679 Personal history of other diseases of the circulatory system: Secondary | ICD-10-CM | POA: Diagnosis not present

## 2018-03-14 DIAGNOSIS — Z8739 Personal history of other diseases of the musculoskeletal system and connective tissue: Secondary | ICD-10-CM | POA: Diagnosis not present

## 2018-03-14 DIAGNOSIS — F0390 Unspecified dementia without behavioral disturbance: Secondary | ICD-10-CM | POA: Diagnosis not present

## 2018-03-15 DIAGNOSIS — I509 Heart failure, unspecified: Secondary | ICD-10-CM | POA: Diagnosis not present

## 2018-03-15 DIAGNOSIS — N184 Chronic kidney disease, stage 4 (severe): Secondary | ICD-10-CM | POA: Diagnosis not present

## 2018-03-15 DIAGNOSIS — Z8639 Personal history of other endocrine, nutritional and metabolic disease: Secondary | ICD-10-CM | POA: Diagnosis not present

## 2018-03-15 DIAGNOSIS — Z8739 Personal history of other diseases of the musculoskeletal system and connective tissue: Secondary | ICD-10-CM | POA: Diagnosis not present

## 2018-03-15 DIAGNOSIS — F0390 Unspecified dementia without behavioral disturbance: Secondary | ICD-10-CM | POA: Diagnosis not present

## 2018-03-15 DIAGNOSIS — Z8679 Personal history of other diseases of the circulatory system: Secondary | ICD-10-CM | POA: Diagnosis not present

## 2018-03-17 DIAGNOSIS — I509 Heart failure, unspecified: Secondary | ICD-10-CM | POA: Diagnosis not present

## 2018-03-17 DIAGNOSIS — Z8639 Personal history of other endocrine, nutritional and metabolic disease: Secondary | ICD-10-CM | POA: Diagnosis not present

## 2018-03-17 DIAGNOSIS — N184 Chronic kidney disease, stage 4 (severe): Secondary | ICD-10-CM | POA: Diagnosis not present

## 2018-03-17 DIAGNOSIS — Z8739 Personal history of other diseases of the musculoskeletal system and connective tissue: Secondary | ICD-10-CM | POA: Diagnosis not present

## 2018-03-17 DIAGNOSIS — Z8679 Personal history of other diseases of the circulatory system: Secondary | ICD-10-CM | POA: Diagnosis not present

## 2018-03-17 DIAGNOSIS — F0390 Unspecified dementia without behavioral disturbance: Secondary | ICD-10-CM | POA: Diagnosis not present

## 2018-03-20 DIAGNOSIS — Z8739 Personal history of other diseases of the musculoskeletal system and connective tissue: Secondary | ICD-10-CM | POA: Diagnosis not present

## 2018-03-20 DIAGNOSIS — F0390 Unspecified dementia without behavioral disturbance: Secondary | ICD-10-CM | POA: Diagnosis not present

## 2018-03-20 DIAGNOSIS — N184 Chronic kidney disease, stage 4 (severe): Secondary | ICD-10-CM | POA: Diagnosis not present

## 2018-03-20 DIAGNOSIS — Z8679 Personal history of other diseases of the circulatory system: Secondary | ICD-10-CM | POA: Diagnosis not present

## 2018-03-20 DIAGNOSIS — Z8639 Personal history of other endocrine, nutritional and metabolic disease: Secondary | ICD-10-CM | POA: Diagnosis not present

## 2018-03-20 DIAGNOSIS — I509 Heart failure, unspecified: Secondary | ICD-10-CM | POA: Diagnosis not present

## 2018-03-22 DIAGNOSIS — N184 Chronic kidney disease, stage 4 (severe): Secondary | ICD-10-CM | POA: Diagnosis not present

## 2018-03-22 DIAGNOSIS — Z8739 Personal history of other diseases of the musculoskeletal system and connective tissue: Secondary | ICD-10-CM | POA: Diagnosis not present

## 2018-03-22 DIAGNOSIS — Z8679 Personal history of other diseases of the circulatory system: Secondary | ICD-10-CM | POA: Diagnosis not present

## 2018-03-22 DIAGNOSIS — I509 Heart failure, unspecified: Secondary | ICD-10-CM | POA: Diagnosis not present

## 2018-03-22 DIAGNOSIS — F0390 Unspecified dementia without behavioral disturbance: Secondary | ICD-10-CM | POA: Diagnosis not present

## 2018-03-22 DIAGNOSIS — Z8639 Personal history of other endocrine, nutritional and metabolic disease: Secondary | ICD-10-CM | POA: Diagnosis not present

## 2018-03-24 DIAGNOSIS — N184 Chronic kidney disease, stage 4 (severe): Secondary | ICD-10-CM | POA: Diagnosis not present

## 2018-03-24 DIAGNOSIS — Z8679 Personal history of other diseases of the circulatory system: Secondary | ICD-10-CM | POA: Diagnosis not present

## 2018-03-24 DIAGNOSIS — I509 Heart failure, unspecified: Secondary | ICD-10-CM | POA: Diagnosis not present

## 2018-03-24 DIAGNOSIS — Z8739 Personal history of other diseases of the musculoskeletal system and connective tissue: Secondary | ICD-10-CM | POA: Diagnosis not present

## 2018-03-24 DIAGNOSIS — F0390 Unspecified dementia without behavioral disturbance: Secondary | ICD-10-CM | POA: Diagnosis not present

## 2018-03-24 DIAGNOSIS — Z8639 Personal history of other endocrine, nutritional and metabolic disease: Secondary | ICD-10-CM | POA: Diagnosis not present

## 2018-03-27 DIAGNOSIS — N184 Chronic kidney disease, stage 4 (severe): Secondary | ICD-10-CM | POA: Diagnosis not present

## 2018-03-27 DIAGNOSIS — F0390 Unspecified dementia without behavioral disturbance: Secondary | ICD-10-CM | POA: Diagnosis not present

## 2018-03-27 DIAGNOSIS — Z8679 Personal history of other diseases of the circulatory system: Secondary | ICD-10-CM | POA: Diagnosis not present

## 2018-03-27 DIAGNOSIS — I509 Heart failure, unspecified: Secondary | ICD-10-CM | POA: Diagnosis not present

## 2018-03-27 DIAGNOSIS — Z8739 Personal history of other diseases of the musculoskeletal system and connective tissue: Secondary | ICD-10-CM | POA: Diagnosis not present

## 2018-03-27 DIAGNOSIS — Z8639 Personal history of other endocrine, nutritional and metabolic disease: Secondary | ICD-10-CM | POA: Diagnosis not present

## 2018-03-29 DIAGNOSIS — N184 Chronic kidney disease, stage 4 (severe): Secondary | ICD-10-CM | POA: Diagnosis not present

## 2018-03-29 DIAGNOSIS — Z8639 Personal history of other endocrine, nutritional and metabolic disease: Secondary | ICD-10-CM | POA: Diagnosis not present

## 2018-03-29 DIAGNOSIS — Z8739 Personal history of other diseases of the musculoskeletal system and connective tissue: Secondary | ICD-10-CM | POA: Diagnosis not present

## 2018-03-29 DIAGNOSIS — F0390 Unspecified dementia without behavioral disturbance: Secondary | ICD-10-CM | POA: Diagnosis not present

## 2018-03-29 DIAGNOSIS — I509 Heart failure, unspecified: Secondary | ICD-10-CM | POA: Diagnosis not present

## 2018-03-29 DIAGNOSIS — Z8679 Personal history of other diseases of the circulatory system: Secondary | ICD-10-CM | POA: Diagnosis not present

## 2018-03-30 DIAGNOSIS — Z8639 Personal history of other endocrine, nutritional and metabolic disease: Secondary | ICD-10-CM | POA: Diagnosis not present

## 2018-03-30 DIAGNOSIS — I509 Heart failure, unspecified: Secondary | ICD-10-CM | POA: Diagnosis not present

## 2018-03-30 DIAGNOSIS — Z8679 Personal history of other diseases of the circulatory system: Secondary | ICD-10-CM | POA: Diagnosis not present

## 2018-03-30 DIAGNOSIS — N184 Chronic kidney disease, stage 4 (severe): Secondary | ICD-10-CM | POA: Diagnosis not present

## 2018-03-30 DIAGNOSIS — F0390 Unspecified dementia without behavioral disturbance: Secondary | ICD-10-CM | POA: Diagnosis not present

## 2018-03-30 DIAGNOSIS — Z8739 Personal history of other diseases of the musculoskeletal system and connective tissue: Secondary | ICD-10-CM | POA: Diagnosis not present

## 2018-04-07 DEATH — deceased

## 2020-06-18 IMAGING — DX DG PORTABLE PELVIS
2 series · 2 of 2 positions shown · non-contrast
Comparison: Yon Werner hip radiographs 12/22/2017.
Intraoperative image 16 21 hours today.

CLINICAL DATA: [AGE] male status post closed reduction of
dislocated left total hip arthroplasty.

EXAM:
PORTABLE PELVIS 1-2 VIEWS

[pelvis ap (1 of 2)]
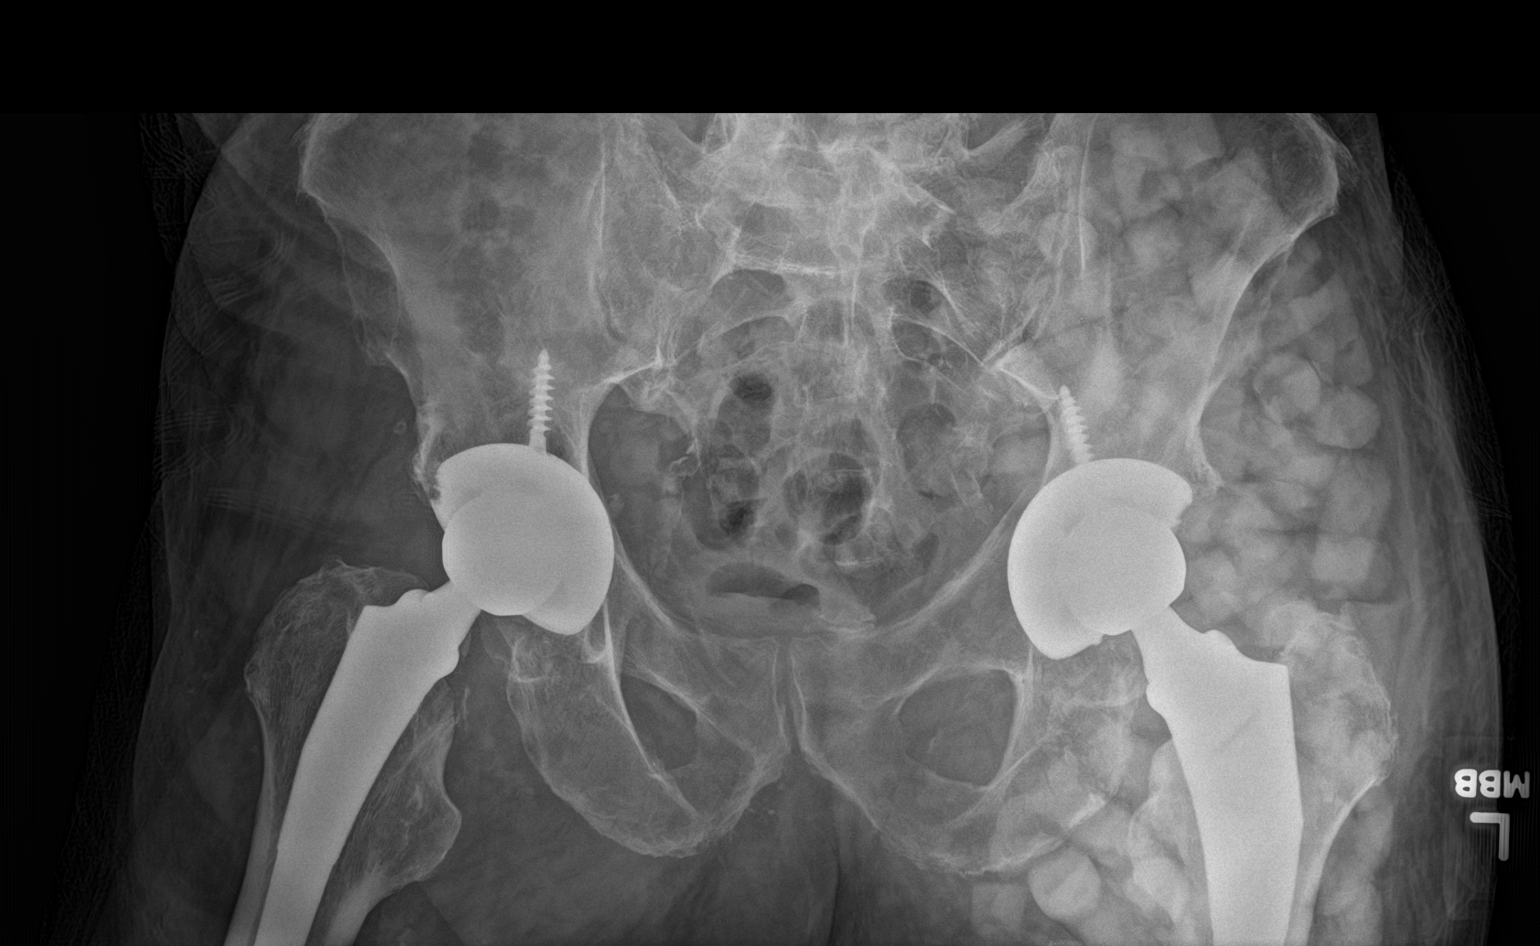

[pelvis ap (2 of 2)]
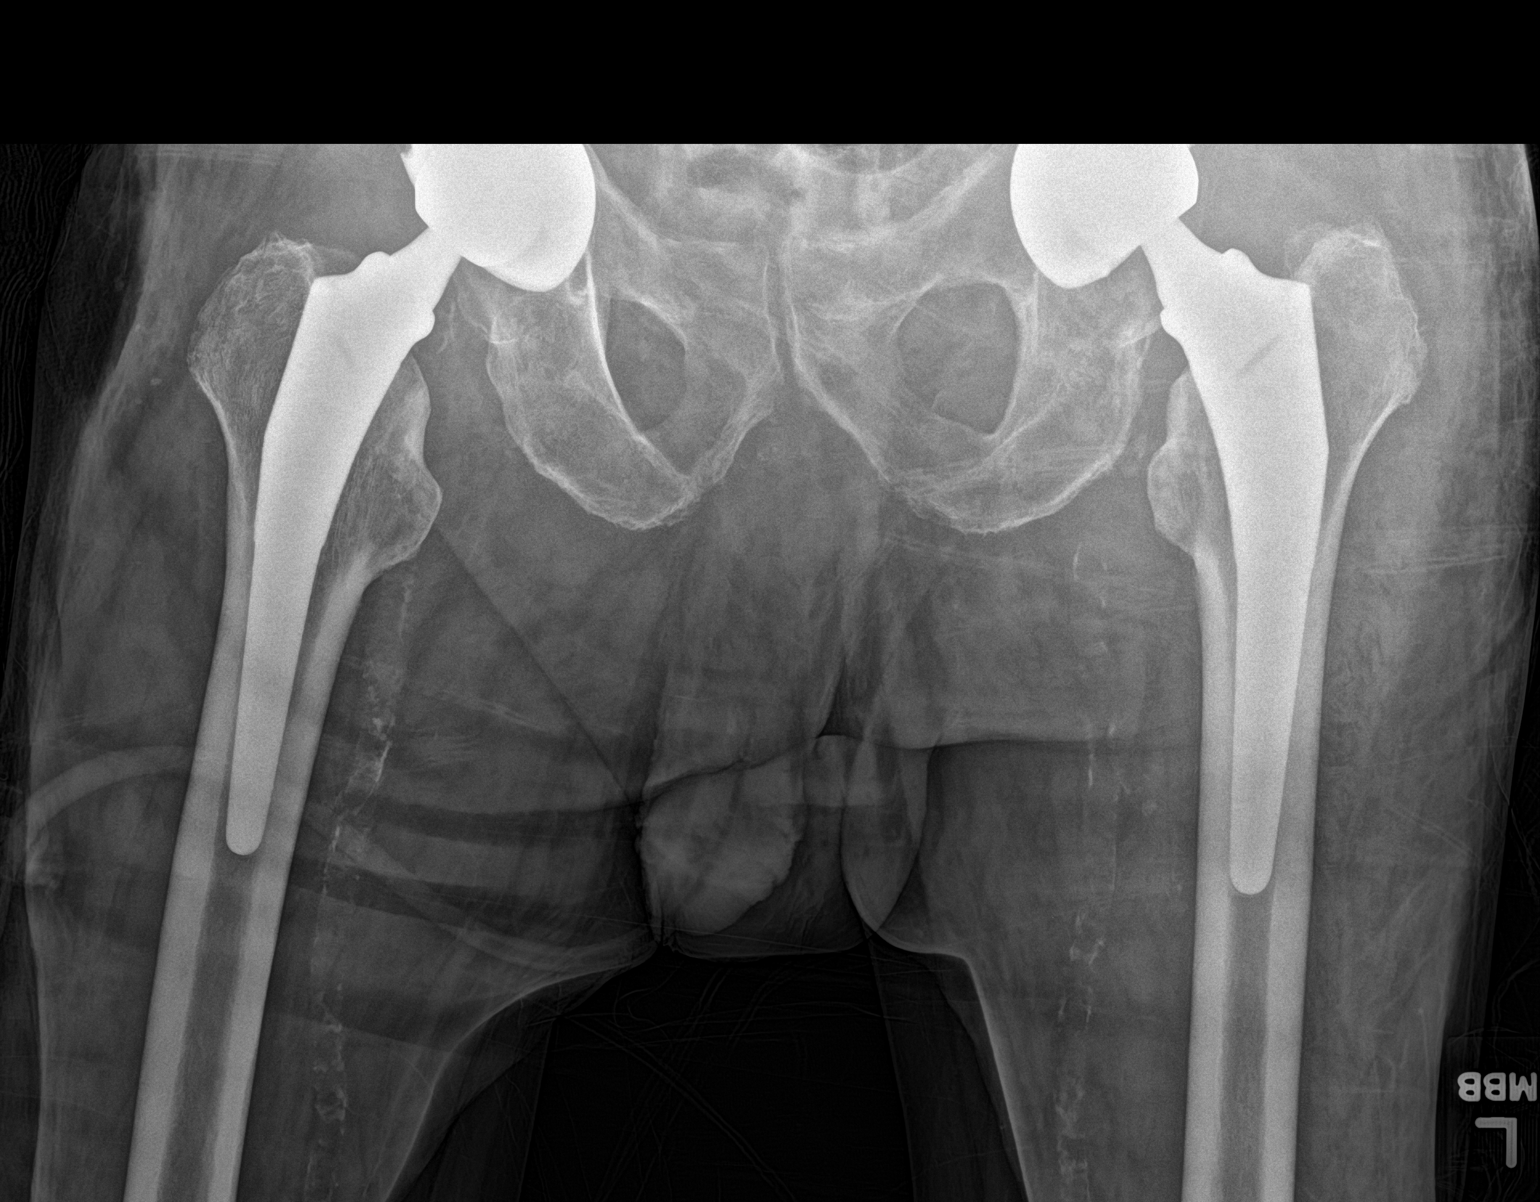

[2 of 2 positions shown; findings below may reference images not displayed]

FINDINGS: Portable AP supine view at 7433 hours. Bilateral total hip
arthroplasties appear normally aligned on these AP images. Hardware
appears intact. No acute osseous abnormality identified. Aortoiliac
and bilateral femoral artery calcified atherosclerosis. Negative
visible bowel gas pattern.
IMPRESSION: Bilateral total hip arthroplasties with no adverse features.

## 2020-07-11 IMAGING — DX DG CHEST 2V
2 series · 2 of 2 positions shown · non-contrast
Comparison: 12/25/2017

CLINICAL DATA: Central chest pain

EXAM:
CHEST - 2 VIEW

[chest ap]
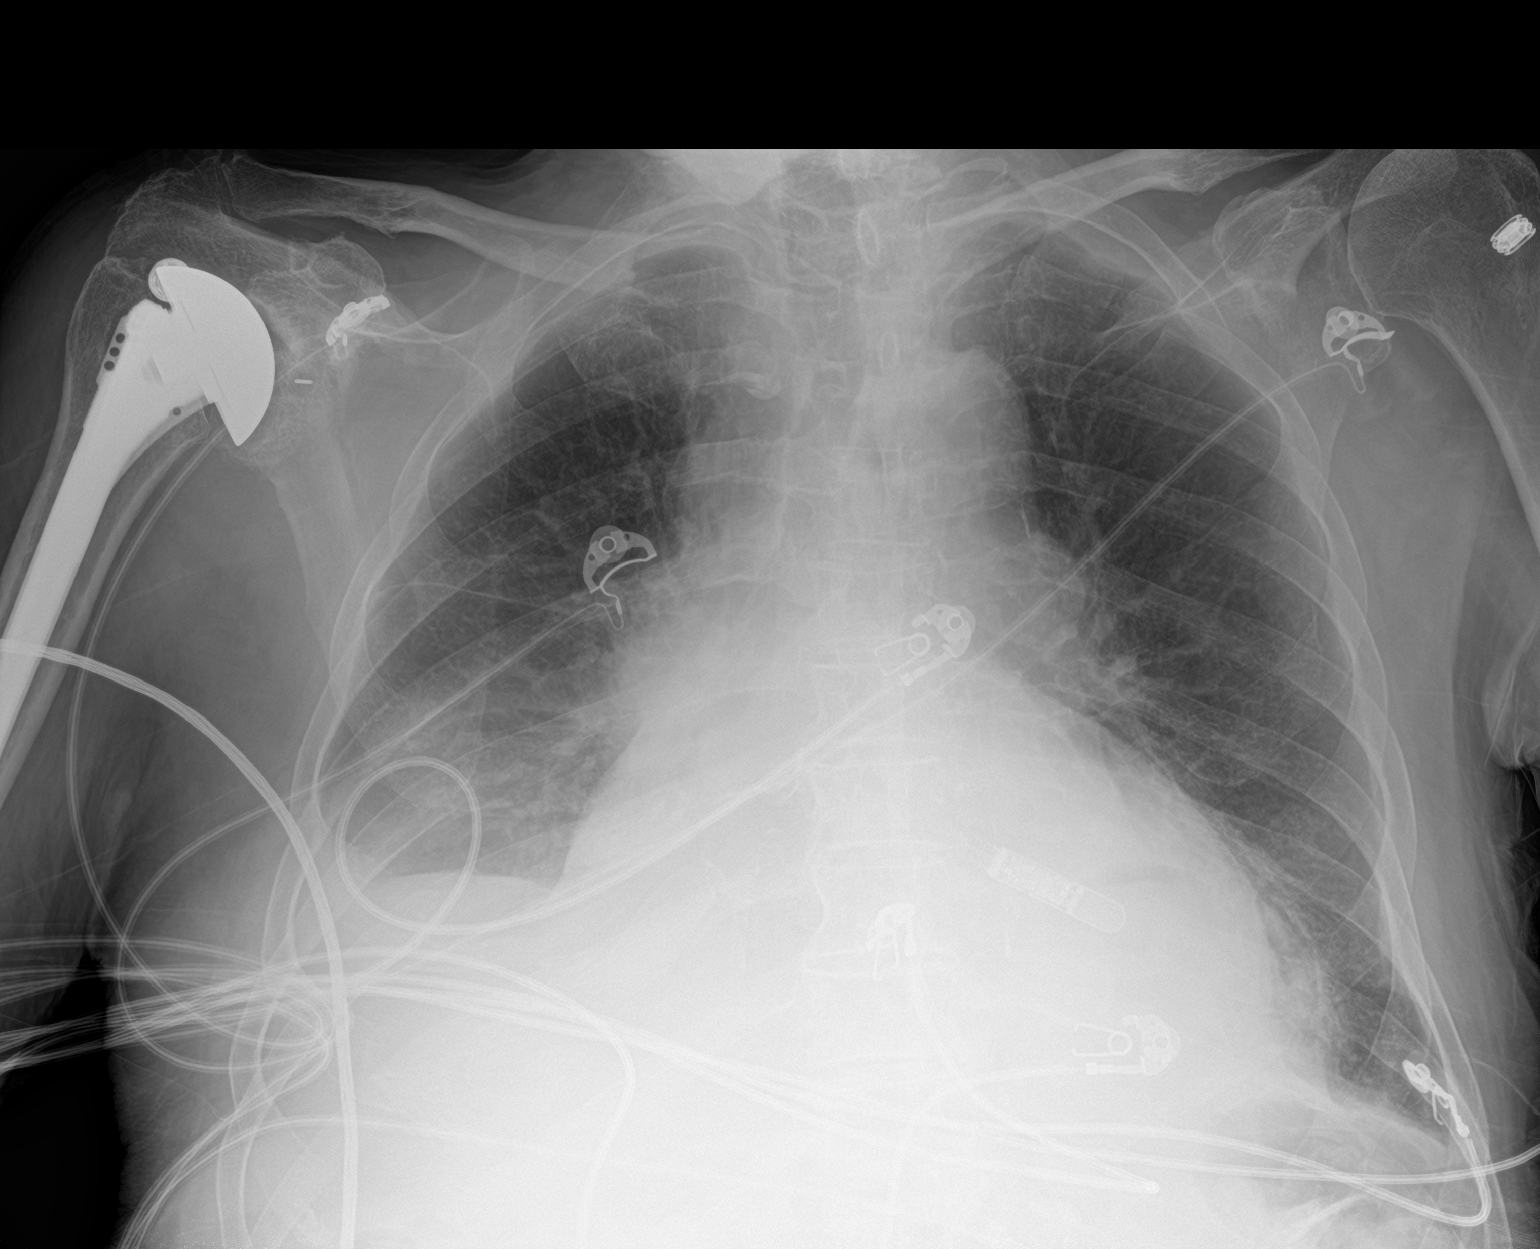

[chest lat]
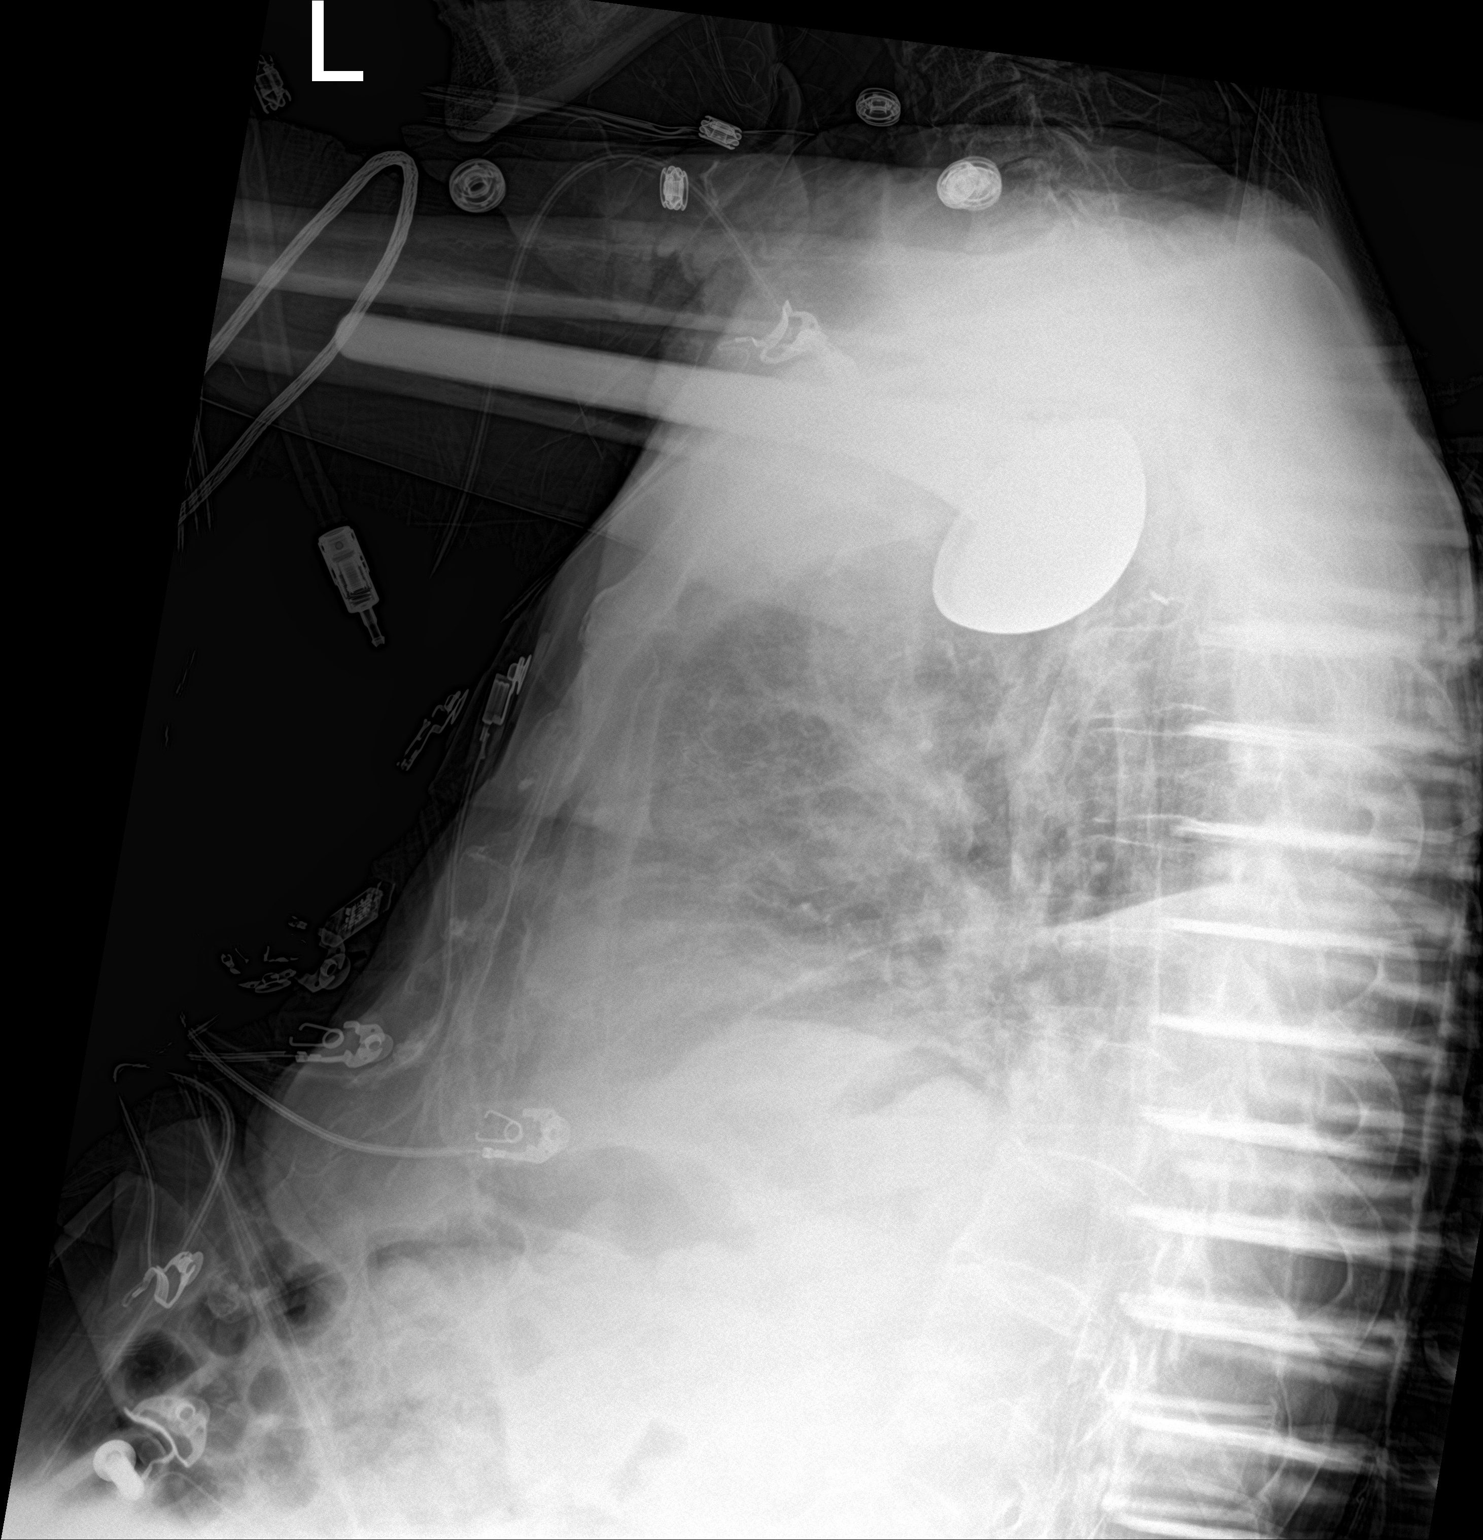

[2 of 2 positions shown; findings below may reference images not displayed]

FINDINGS: Cardiac shadow is enlarged in size and stable. Loop recorder is
again noted. Right-sided pleural effusion is seen with likely
underlying atelectatic changes. No other focal infiltrate is seen.
IMPRESSION: Right-sided effusion with underlying atelectatic changes.

## 2020-07-12 IMAGING — US IR THORACENTESIS ASP PLEURAL SPACE W/IMG GUIDE
1 series · 2 of 2 positions shown · non-contrast
Comparison: none

INDICATION: Shortness of breath. Right-sided pleural effusion. Request for
diagnostic and therapeutic thoracentesis.

[Series 1: ir (id) (id)/(id)/(id) ir · 2 of 2 slices shown]
[im 1/2]
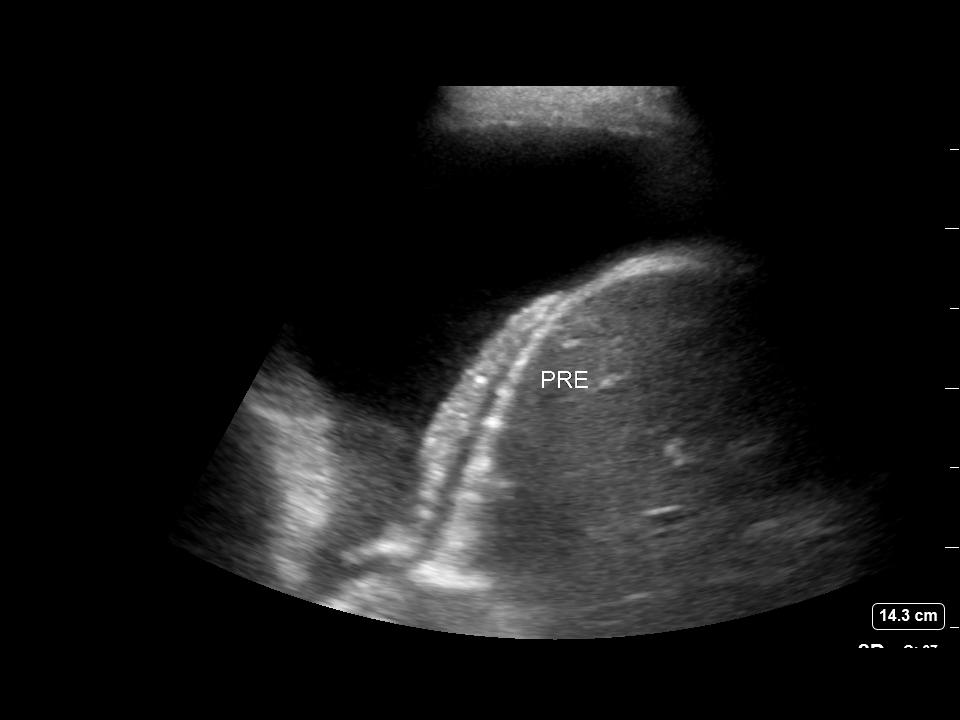
[im 2/2]
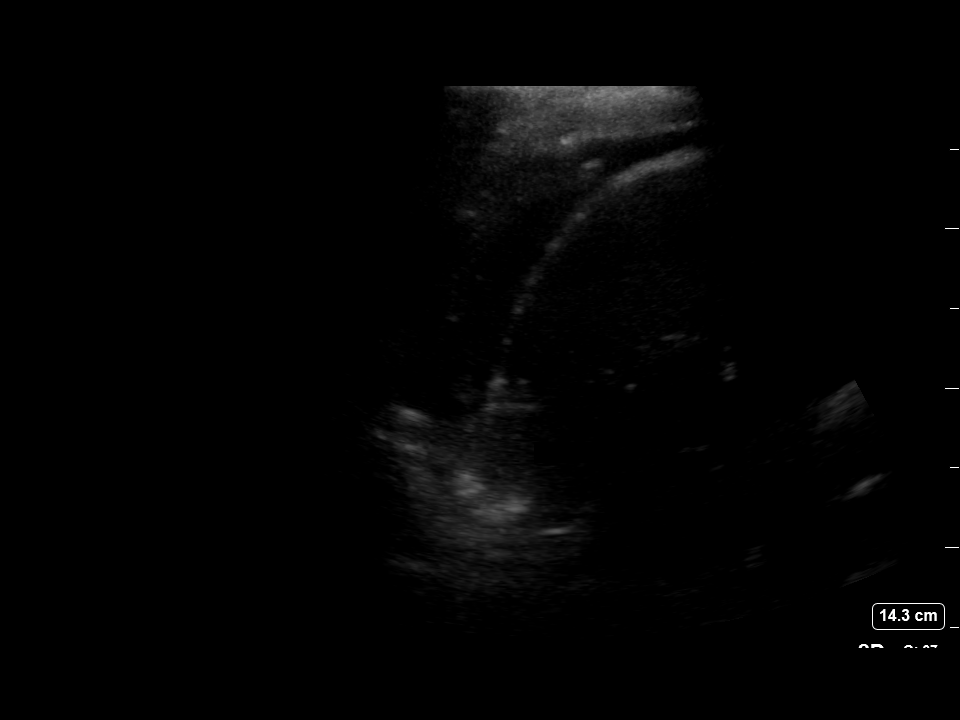

[2 of 2 positions shown; findings below may reference images not displayed]

EXAM:
ULTRASOUND GUIDED RIGHT THORACENTESIS

MEDICATIONS:
None.

COMPLICATIONS:
None immediate.

PROCEDURE:
An ultrasound guided thoracentesis was thoroughly discussed with the
patient and questions answered. The benefits, risks, alternatives
and complications were also discussed. The patient understands and
wishes to proceed with the procedure. Written consent was obtained.

Ultrasound was performed to localize and mark an adequate pocket of
fluid in the right chest. The area was then prepped and draped in
the normal sterile fashion. 1% Lidocaine was used for local
anesthesia. Under ultrasound guidance a 6 Fr Safe-T-Centesis
catheter was introduced. Thoracentesis was performed. The catheter
was removed and a dressing applied.
FINDINGS: A total of approximately 1.1 L of amber colored fluid was removed.
Samples were sent to the laboratory as requested by the clinical
team.
IMPRESSION: Successful ultrasound guided right thoracentesis yielding 1.1 L of
pleural fluid.
